# Patient Record
Sex: Male | Born: 1940 | Race: White | Hispanic: No | Marital: Married | Smoking: Former smoker
Health system: Southern US, Community
[De-identification: ages and names within clinical notes are randomized; demographics above are authoritative.]

## PROBLEM LIST (undated history)

## (undated) DIAGNOSIS — Z86010 Personal history of colonic polyps: Secondary | ICD-10-CM

## (undated) DIAGNOSIS — I2119 ST elevation (STEMI) myocardial infarction involving other coronary artery of inferior wall: Secondary | ICD-10-CM

## (undated) DIAGNOSIS — N486 Induration penis plastica: Secondary | ICD-10-CM

## (undated) DIAGNOSIS — F410 Panic disorder [episodic paroxysmal anxiety] without agoraphobia: Secondary | ICD-10-CM

## (undated) DIAGNOSIS — N411 Chronic prostatitis: Secondary | ICD-10-CM

## (undated) DIAGNOSIS — C801 Malignant (primary) neoplasm, unspecified: Secondary | ICD-10-CM

## (undated) DIAGNOSIS — H9190 Unspecified hearing loss, unspecified ear: Secondary | ICD-10-CM

## (undated) DIAGNOSIS — N189 Chronic kidney disease, unspecified: Secondary | ICD-10-CM

## (undated) DIAGNOSIS — E669 Obesity, unspecified: Secondary | ICD-10-CM

## (undated) DIAGNOSIS — B351 Tinea unguium: Secondary | ICD-10-CM

## (undated) DIAGNOSIS — E785 Hyperlipidemia, unspecified: Secondary | ICD-10-CM

## (undated) DIAGNOSIS — I10 Essential (primary) hypertension: Secondary | ICD-10-CM

## (undated) DIAGNOSIS — R011 Cardiac murmur, unspecified: Secondary | ICD-10-CM

## (undated) DIAGNOSIS — I4510 Unspecified right bundle-branch block: Secondary | ICD-10-CM

## (undated) DIAGNOSIS — H9319 Tinnitus, unspecified ear: Secondary | ICD-10-CM

## (undated) DIAGNOSIS — D649 Anemia, unspecified: Secondary | ICD-10-CM

## (undated) DIAGNOSIS — H269 Unspecified cataract: Secondary | ICD-10-CM

## (undated) DIAGNOSIS — M720 Palmar fascial fibromatosis [Dupuytren]: Secondary | ICD-10-CM

## (undated) DIAGNOSIS — E7800 Pure hypercholesterolemia, unspecified: Secondary | ICD-10-CM

## (undated) DIAGNOSIS — L570 Actinic keratosis: Secondary | ICD-10-CM

## (undated) DIAGNOSIS — D049 Carcinoma in situ of skin, unspecified: Secondary | ICD-10-CM

## (undated) DIAGNOSIS — G4730 Sleep apnea, unspecified: Secondary | ICD-10-CM

## (undated) DIAGNOSIS — I639 Cerebral infarction, unspecified: Secondary | ICD-10-CM

## (undated) DIAGNOSIS — Z9861 Coronary angioplasty status: Secondary | ICD-10-CM

## (undated) DIAGNOSIS — I2510 Atherosclerotic heart disease of native coronary artery without angina pectoris: Secondary | ICD-10-CM

## (undated) DIAGNOSIS — N179 Acute kidney failure, unspecified: Secondary | ICD-10-CM

## (undated) HISTORY — DX: Pure hypercholesterolemia, unspecified: E78.00

## (undated) HISTORY — DX: Panic disorder (episodic paroxysmal anxiety): F41.0

## (undated) HISTORY — PX: CATARACT EXTRACTION W/ INTRAOCULAR LENS  IMPLANT, BILATERAL: SHX1307

## (undated) HISTORY — DX: Cerebral infarction, unspecified: I63.9

## (undated) HISTORY — DX: Tinnitus, unspecified ear: H93.19

## (undated) HISTORY — DX: Tinea unguium: B35.1

## (undated) HISTORY — DX: Coronary angioplasty status: Z98.61

## (undated) HISTORY — DX: Obesity, unspecified: E66.9

## (undated) HISTORY — DX: Personal history of colonic polyps: Z86.010

## (undated) HISTORY — PX: ESOPHAGOGASTRODUODENOSCOPY: SHX1529

## (undated) HISTORY — DX: Unspecified right bundle-branch block: I45.10

## (undated) HISTORY — DX: Hyperlipidemia, unspecified: E78.5

## (undated) HISTORY — DX: ST elevation (STEMI) myocardial infarction involving other coronary artery of inferior wall: I21.19

## (undated) HISTORY — PX: CHOLECYSTECTOMY: SHX55

## (undated) HISTORY — DX: Induration penis plastica: N48.6

## (undated) HISTORY — DX: Essential (primary) hypertension: I10

## (undated) HISTORY — DX: Unspecified cataract: H26.9

## (undated) HISTORY — DX: Sleep apnea, unspecified: G47.30

## (undated) HISTORY — DX: Chronic prostatitis: N41.1

## (undated) HISTORY — DX: Carcinoma in situ of skin, unspecified: D04.9

## (undated) HISTORY — DX: Anemia, unspecified: D64.9

## (undated) HISTORY — DX: Cardiac murmur, unspecified: R01.1

## (undated) HISTORY — DX: Atherosclerotic heart disease of native coronary artery without angina pectoris: I25.10

## (undated) HISTORY — DX: Palmar fascial fibromatosis (dupuytren): M72.0

---
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
## 1999-04-29 ENCOUNTER — Other Ambulatory Visit: Admission: RE | Admit: 1999-04-29 | Discharge: 1999-04-29 | Payer: Self-pay | Admitting: *Deleted

## 2001-01-02 ENCOUNTER — Inpatient Hospital Stay (HOSPITAL_COMMUNITY): Admission: EM | Admit: 2001-01-02 | Discharge: 2001-01-11 | Payer: Self-pay | Admitting: Emergency Medicine

## 2001-01-02 ENCOUNTER — Encounter: Payer: Self-pay | Admitting: Emergency Medicine

## 2001-01-02 HISTORY — PX: CORONARY ANGIOPLASTY: SHX604

## 2004-03-09 ENCOUNTER — Ambulatory Visit (HOSPITAL_COMMUNITY): Admission: RE | Admit: 2004-03-09 | Discharge: 2004-03-09 | Payer: Self-pay | Admitting: *Deleted

## 2004-03-09 HISTORY — PX: COLONOSCOPY: SHX174

## 2004-11-05 ENCOUNTER — Encounter: Admission: RE | Admit: 2004-11-05 | Discharge: 2004-11-05 | Payer: Self-pay | Admitting: Family Medicine

## 2006-02-22 ENCOUNTER — Ambulatory Visit: Payer: Self-pay | Admitting: Family Medicine

## 2006-04-05 ENCOUNTER — Ambulatory Visit (HOSPITAL_BASED_OUTPATIENT_CLINIC_OR_DEPARTMENT_OTHER): Admission: RE | Admit: 2006-04-05 | Discharge: 2006-04-05 | Payer: Self-pay | Admitting: Orthopedic Surgery

## 2006-04-05 ENCOUNTER — Encounter (INDEPENDENT_AMBULATORY_CARE_PROVIDER_SITE_OTHER): Payer: Self-pay | Admitting: Specialist

## 2006-09-07 DIAGNOSIS — F529 Unspecified sexual dysfunction not due to a substance or known physiological condition: Secondary | ICD-10-CM | POA: Insufficient documentation

## 2006-09-07 DIAGNOSIS — H9319 Tinnitus, unspecified ear: Secondary | ICD-10-CM | POA: Insufficient documentation

## 2006-09-07 DIAGNOSIS — D049 Carcinoma in situ of skin, unspecified: Secondary | ICD-10-CM | POA: Insufficient documentation

## 2006-09-07 DIAGNOSIS — I10 Essential (primary) hypertension: Secondary | ICD-10-CM

## 2006-09-07 DIAGNOSIS — F410 Panic disorder [episodic paroxysmal anxiety] without agoraphobia: Secondary | ICD-10-CM | POA: Insufficient documentation

## 2006-09-07 DIAGNOSIS — E785 Hyperlipidemia, unspecified: Secondary | ICD-10-CM

## 2006-09-07 DIAGNOSIS — E119 Type 2 diabetes mellitus without complications: Secondary | ICD-10-CM

## 2006-09-07 DIAGNOSIS — B351 Tinea unguium: Secondary | ICD-10-CM

## 2006-09-07 DIAGNOSIS — N486 Induration penis plastica: Secondary | ICD-10-CM | POA: Insufficient documentation

## 2006-09-07 DIAGNOSIS — R42 Dizziness and giddiness: Secondary | ICD-10-CM

## 2006-09-07 HISTORY — DX: Tinea unguium: B35.1

## 2006-11-29 ENCOUNTER — Encounter (INDEPENDENT_AMBULATORY_CARE_PROVIDER_SITE_OTHER): Payer: Self-pay | Admitting: Family Medicine

## 2007-03-20 ENCOUNTER — Encounter (INDEPENDENT_AMBULATORY_CARE_PROVIDER_SITE_OTHER): Payer: Self-pay | Admitting: Family Medicine

## 2007-04-20 ENCOUNTER — Ambulatory Visit: Payer: Self-pay | Admitting: Family Medicine

## 2007-04-20 LAB — CONVERTED CEMR LAB: PSA: 0.47 ng/mL (ref 0.10–4.00)

## 2007-04-23 ENCOUNTER — Telehealth (INDEPENDENT_AMBULATORY_CARE_PROVIDER_SITE_OTHER): Payer: Self-pay | Admitting: *Deleted

## 2007-05-22 ENCOUNTER — Ambulatory Visit: Payer: Self-pay | Admitting: Family Medicine

## 2007-06-26 ENCOUNTER — Encounter (INDEPENDENT_AMBULATORY_CARE_PROVIDER_SITE_OTHER): Payer: Self-pay | Admitting: Family Medicine

## 2007-08-10 ENCOUNTER — Encounter: Payer: Self-pay | Admitting: Internal Medicine

## 2010-02-13 HISTORY — PX: OTHER SURGICAL HISTORY: SHX169

## 2010-02-18 ENCOUNTER — Ambulatory Visit: Payer: Self-pay | Admitting: Cardiology

## 2010-02-22 ENCOUNTER — Telehealth (INDEPENDENT_AMBULATORY_CARE_PROVIDER_SITE_OTHER): Payer: Self-pay | Admitting: *Deleted

## 2010-02-23 ENCOUNTER — Encounter (HOSPITAL_COMMUNITY)
Admission: RE | Admit: 2010-02-23 | Discharge: 2010-03-12 | Payer: Self-pay | Source: Home / Self Care | Admitting: Cardiology

## 2010-02-23 ENCOUNTER — Ambulatory Visit: Payer: Self-pay

## 2010-02-23 ENCOUNTER — Ambulatory Visit: Payer: Self-pay | Admitting: Internal Medicine

## 2010-02-23 ENCOUNTER — Encounter: Payer: Self-pay | Admitting: Internal Medicine

## 2010-06-15 NOTE — Assessment & Plan Note (Signed)
Summary: Cardiology Nuclear Testing  Nuclear Med Background Indications for Stress Test: Evaluation for Ischemia   History: Angioplasty, Myocardial Infarction, Myocardial Perfusion Study, Stents  History Comments: 02 MI with PTCA of circ and stent of RCA 09 MPS with infer scar and no sign isch     Nuclear Pre-Procedure Cardiac Risk Factors: Hypertension, IDDM Type 2, Lipids, Obesity, RBBB Caffeine/Decaff Intake: none NPO After: 6:30 PM Lungs: clear IV 0.9% NS with Angio Cath: 20g     IV Site: R Hand IV Started by: Cathlyn Parsons, RN Chest Size (in) 42     Height (in): 70 Weight (lb): 265 BMI: 38.16 Tech Comments: Held Diabetes meds.  Nuclear Med Study 1 or 2 day study:  1 day     Stress Test Type:  Eugenie Birks Reading MD:  Arvilla Meres, MD     Referring MD:  Fermin Schwab Resting Radionuclide:  Technetium 46m Tetrofosmin     Resting Radionuclide Dose:  11.0 mCi  Stress Radionuclide:  Technetium 40m Tetrofosmin     Stress Radionuclide Dose:  33.0 mCi   Stress Protocol Exercise Time (min):  2:00 min     Max HR:  136 bpm     Predicted Max HR:  152 bpm  Max Systolic BP: 172 mm Hg     Percent Max HR:  89.47 %     METS: 1.7 Rate Pressure Product:  16109  Lexiscan: 0.4 mg   Stress Test Technologist:  Cathlyn Parsons, RN     Nuclear Technologist:  Doyne Keel, CNMT  Rest Procedure  Myocardial perfusion imaging was performed at rest 45 minutes following the intravenous administration of Technetium 20m Tetrofosmin.  Stress Procedure  The patient received IV Lexiscan 0.4 mg over 15-seconds with concurrent low level exercise and then Technetium 40m Tetrofosmin was injected at 30-seconds while the patient continued walking one more minute.  There were nonspecific STchanges with Lexiscan. Patient had frequent PVC's. Patient had some AV block prior to test and post stress test.  Patient had no chest pain. Quantitative spect images were obtained after a 45 minute  delay.  QPS Raw Data Images:  Normal; no motion artifact; normal heart/lung ratio. Stress Images:  Decreased uptake in the inferior and lateral walls Rest Images:  Decreased uptake in the inferior wall Subtraction (SDS):  Dense inferior infarct with mild ischemia in the lateral wall Transient Ischemic Dilatation:  1.08  (Normal <1.22)  Lung/Heart Ratio:  .37  (Normal <0.45)  Quantitative Gated Spect Images QGS EDV:  161 ml QGS ESV:  99 ml QGS EF:  39 % QGS cine images:  Inferolateral hypokinesis  Findings Abormal nuclear study Evidence for lateral ischemia  Evidence for inferior infarct  Evidence for LV Dysfunction LV Dysfunction    Overall Impression  Exercise Capacity: Lexiscan with no exercise. ECG Impression: No significant ST segment change suggestive of ischemia. Overall Impression: Abnormal stress nuclear study. Overall Impression Comments: Dense inferior infarct with mild ischemia in the lateral wall, dilated LV with EF 39%.

## 2010-06-15 NOTE — Progress Notes (Signed)
Summary: nuc pre procedure  Phone Note Outgoing Call Call back at Home Phone [phone]   Call placed by: Cathlyn Parsons RN,  February 22, 2010 4:50 PM Call placed to: Patient Reason for Call: Confirm/change Appt Summary of Call: Left message with information on Myoview Information Sheet (see scanned document for details).      Nuclear Med Background Indications for Stress Test: Evaluation for Ischemia   History: Angioplasty, Myocardial Infarction, Myocardial Perfusion Study, Stents  History Comments: 02 MI with PTCA of circ and stent of RCA 09 MPS with infer scar and no sign isch     Nuclear Pre-Procedure Cardiac Risk Factors: Hypertension, IDDM Type 2, Lipids, Obesity, RBBB Height (in): 70

## 2010-08-10 DIAGNOSIS — D539 Nutritional anemia, unspecified: Secondary | ICD-10-CM | POA: Insufficient documentation

## 2010-09-16 ENCOUNTER — Other Ambulatory Visit: Payer: Self-pay | Admitting: *Deleted

## 2010-09-16 DIAGNOSIS — I2510 Atherosclerotic heart disease of native coronary artery without angina pectoris: Secondary | ICD-10-CM

## 2010-09-16 MED ORDER — ISOSORBIDE MONONITRATE ER 30 MG PO TB24: ORAL_TABLET | ORAL | Status: DC

## 2010-10-01 NOTE — Op Note (Signed)
NAME:  Jesse Wheeler, ADACHI                ACCOUNT NO.:  [id number]   MEDICAL RECORD NO.:  [id number]          PATIENT TYPE:  AMB   LOCATION:  DSC                          FACILITY:  [facility]   PHYSICIAN:  Cindee Salt, M.D.       DATE OF BIRTH:  1940/10/27   DATE OF PROCEDURE:  04/05/2006  DATE OF DISCHARGE:                                 OPERATIVE REPORT   PREOPERATIVE DIAGNOSIS:  Dupuytren's contracture left middle, left ring  finger.   POSTOPERATIVE DIAGNOSIS:  Dupuytren's contracture left middle, left ring  finger.   OPERATION:  Excision palmar fascia, left middle, left ring finger with VY  advancement.   SURGEON:  Cindee Salt, M.D.   ASSISTANT:  Carolyne Fiscal R.N.   ANESTHESIA:  Nerve block.   HISTORY:  The patient is a 70 year old male with a history of palmar  fasciitis, Dupuytren's contracture with contracture of the ring finger, to a  lesser extent middle finger left hand.  He is desirous of removal.  Pre,  peri and postoperative course been thoroughly discussed with the patient.  He is aware there is no guarantee with the surgery, probability of  recurrence, possibility of extension, possibility of infection, injury to  arteries, nerves, tendons, incomplete relief of symptoms, stiffness,  dystrophy, the possibility of open palm technique.  In the preoperative area  questions were encouraged and answered.  He has elected to proceed to have  this done.  The area of the incisions marked by both the patient and  surgeon.   PROCEDURE:  The patient is brought to the operating room where an axillary  block was carried out without difficulty.  He was prepped using DuraPrep,  supine position, left arm free.  The limb was exsanguinated from the wrist  proximally.  A tourniquet placed high on the arm was inflated to 250 mmHg.  A zigzag incision volar Brunner type incision was made based primarily on  the ring finger, carried down through subcutaneous tissue.  Bleeders were  electrocauterized.   The cord was identified proximally over the carpal  retinaculum.  This was elevated.  The neurovascular structures were  identified and traced distally.  Lifting the palmar fascia, the attachments  to the A1 and A2 pulleys were excised with protection to the neurovascular  bundles.  This allowed mobilization of the cord distally and a large  contribution to the middle finger was noted through the natatory ligament.  The cord was followed as a lateral digital sheath out to the middle phalanx.  This was excised in toto.  A separate incision was then made over the ring  finger proximal phalanx.  The contribution to the middle finger was released  proximally and then the natatory ligament cord was traced distally and  removed in toto, protecting neurovascular bundles.  This allowed complete  extension of both middle and ring fingers.  The wounds were irrigated.  The  V's were converted to Y's.  A double vessel loop drain was then placed to  the depths of the wound and the skin was then closed loosely with  interrupted 5-0  nylon sutures.  A sterile compressive dressing was applied.  Tourniquet deflated.  All fingers immediately pinked.  A splint was applied.  The patient was taken to recovery room for observation in satisfactory  condition.  He will be discharged home to return to [facility] in one week on Vicodin.           ______________________________  Cindee Salt, M.D.     GK/MEDQ  D:  04/05/2006  T:  04/05/2006  Job:  [id number]   cc:   Leanne Chang, M.D.

## 2010-10-01 NOTE — Discharge Summary (Signed)
[place]. [facility]  Patient:    Jesse Wheeler, Jesse Wheeler Visit Number: [id number] MRN: [id number]          Service Type: MED Location: [phone] Attending Physician:  Jesse Wheeler Dictated by:   Jesse Wheeler Jesse Wheeler, R.N., A.N.P. Adm. Date:  01/02/2001 Disc. Date: 01/11/2001   CC:         Jesse Wheeler, M.D.  Jesse Wheeler, M.D.   Discharge Summary  DISCHARGE DIAGNOSES: 1. Acute inferior myocardial infarction with initial stent deployment x 3    to the right coronary artery with subsequent occlusion and recurrent    myocardial infarction with repeat coronary angiography and stent placement    to the left circumflex. 2. Hypertensive heart disease. 3. Non-insulin-dependent diabetes mellitus. 4. Hypercholesterolemia.  HISTORY OF PRESENT ILLNESS:  [name] is a very pleasant 70 year old white male who has known hypertensive heart disease, ongoing diabetes as well as hypercholesterolemia.  He presented to the emergency department on January 02, 2001, after having multiple episodes of chest pressure which had basically been occurring off and on over the previous weekend.  On the day of admission, it became more constant in nature.  There was some radiation into both arms and was associated with diaphoresis.  It was described as an indigestion, pressure-like discomfort.  In the emergency room, his EKG demonstrated acute myocardial infarction and he was subsequently admitted.  Please see the dictation history and physical for further patient presentation and profile.  LABORATORY DATA:  EKG is as described above.  Chemistries were satisfactory. BUN was 24, creatinine 1.1, hemoglobin 17, hematocrit 50.  Chest x-ray showed no active disease.  HOSPITAL COURSE:  The patient was admitted from the emergency department.  He was taken emergently to the cardiac catheterization lab.  It was noted that the right coronary artery was 100% occluded  proximally.  The left main was normal.  The LAD has irregularities with collaterals from the LAD to the posterior descending.  The left circumflex had a 95% posterolateral narrowing and it is approximately 2.0 to 2.5 mm vessel.  Ejection fraction was 50% with inferior hypokinesis.  IV heparin as well as IV Integrilin were administered and subsequently three stents were placed to the right coronary artery. Initial results were quite satisfactory.  He was subsequently transferred to the coronary care unit for further monitoring and evaluation.  His peak cardiac enzymes were 177.  By January 05, 2001, he was doing well without complaints.  He was demonstrating some low blood pressures.  His Prinivil dosage was decreased accordingly with subsequent resolution.  His IV medications were weaned off.  He was placed on Nitrol paste and by January 06, 2001, he continued to do well.  His telemetry monitoring showed some bradycardia with some nodal rhythms as well as missed beats, but was felt to be due to his infarction.  Our plans at that time were for the patient to be discharged on January 07, 2001.  However, on the morning of January 06, 2001, shortly after seen in morning rounds, he began having a pressure-like discomfort in his chest that was rated a 2 on a scale of 0 to 10.  Repeat cardiac enzymes confirmed recurrent myocardial damage with a peak MV of 118. He was placed back on IV nitroglycerin and subsequently IV heparin.  We proceeded on with repeat coronary angiography on Monday, August 26.  At that time there was noted to be considerable thrombus in the stents with very slow  flow.  There were collaterals that have already developed to the distal right coronary from the left system.  The left circumflex had a 99% narrowing and angioplasty was performed with a 2.0 x 20 mm Maverick balloon with an overall satisfactory result obtained.  Initially it was planned for angioplasty to be performed of  the circumflex in a four to six-week time.  In light of the complex revascularization of this long segment in the stented right coronary artery.  After angioplasty of the circumflex was performed, there was an increase in collateral flow to the distal right coronary which resulted in decreased antegrade flow to the distal right coronary.  At that time, it was Dr. Angelina Pih judgment that revascularization of the right coronary artery stents would not be successful on a longterm or shortterm basis and it was elected to forego repeat angioplasty or angiojet of the right coronary. Following that procedure, he was transferred to 6500.  Cardiac enzymes were down to a total of 434 with MB of 33.  His chemistries and CBCs remained stable.  He was weaned off IV nitroglycerin and he has progressed quite well throughout the remainder of his hospitalization.  Today on January 11, 2001, he is doing well with no complaints.  He continues to have a few missed beats on telemetry, but he remains asymptomatic.  Chemistries are satisfactory and he is felt to be a stable candidate for discharge today.  CONDITION ON DISCHARGE:  Improved.  DISCHARGE MEDICATIONS: 1. Imdur 60 mg a day. 2. Amaryl 4 mg a day. 3. Actos 45 mg a day. 4. Lipitor 20 mg a day. 5. Aspirin daily. 6. Protonix 40 mg a day. 7. Plavix 75 mg daily for the next 21 days. 8. Prinivil 20 mg a day. 9. We will be stopping Maxzide as well as Ditropan and he has a prescription    for nitroglycerin to be used on a p.r.n. basis.  We have also given him the okay to resume his Glucophage per his home routine.  ACTIVITY:  No driving, no sexual intercourse.  He may walk 5 to 10 minutes two times a day.  DIET:  Low fat, diabetic.  He is to call our office with any problems.  Otherwise will ask to see him in approximately 10 days and he is asked to call the office to schedule that appointment. Dictated by:   Jesse Wheeler Jesse Wheeler, R.N.,  A.N.P.  Attending Physician:  Jesse Wheeler DD:  01/11/01 TD:  01/11/01 Job: [id number]

## 2010-10-01 NOTE — Cardiovascular Report (Signed)
[place]. [facility]  Patient:    Jesse Wheeler, Jesse Wheeler Visit Number: [id number] MRN: [id number]          Service Type: MED Location: MICU 2116 [id number] Attending Physician:  Eleanora Neighbor Proc. Date: 01/02/01 Adm. Date:  01/02/2001   CC:         Lilyan Punt. Sydnee Levans, M.D., [facility]   Cardiac Catheterization  HISTORY:  The patient presents with acute inferior myocardial infarction and is referred for acute catheterization and angioplasty.  PROCEDURE:  Left heart catheterization with selective coronary angiography, left ventricular angiography, left ventricular angiography, and stent placement into the right coronary artery.  TYPE AND SITE OF ENTRY:  Percutaneous right femoral artery.  CATHETERS:  A 6 French 4 curved Judkins right and left coronary catheters, 6 French pigtail ventriculographic catheter, 7 [place] guide with side holes, Hi-Torque floppy guide wire, BMW guide wire, Cross-It 200 wire, a 3.0 x 20 mm Maverick, subsequent return with two different 3.0 x 23 mm Penta stents and a third 3.0 x 13 mm Penta stent, all placed in the right coronary artery.  MEDICATIONS GIVEN DURING THE PROCEDURE:  Heparin, Versed, fentanyl, and Integrilin double bolus.  COMMENTS:  The patient in general, tolerated the procedure well.  It was a long procedure.  HEMODYNAMIC DATA:  The aortic pressure was 107/75, LV was 121/10-19.  There was no aortic valve gradient noted on pullback.  ANGIOGRAPHIC DATA: 1. Left main coronary artery:  Normal. 2. Left anterior descending:  The left anterior descending is a moderately    long vessel that ends at the apex but does give collaterals to the    distal right coronary artery.  There is a 50% narrowing in a second    diagonal vessel.  There are irregularities in the left anterior descending    but no significant focal disease. 3. Left circumflex:  The left circumflex continues primarily as a large    obtuse marginal.  There  is a secondary branch of this marginal and there is    95% focal stenosis.  It is a 2 to 2.5 mm vessel and would be suitable for    angioplasty because this is a significant focal lesion.  The left    circumflex does give scanty collaterals to the distal right coronary    artery. 4. Right coronary artery:  The right coronary artery is totally occluded    proximally.  LEFT VENTRICULAR ANGIOGRAM:  Left ventricular angiogram was performed in the RAO position.  Overall, cardiac size and silhouette were normal.  The apex, including the inferior apex, pulled in reasonably well, and there was hypokinesis of the inferior base.  There was no mitral regurgitation, intracardiac calcification or intracavitary filling defect.  ANGIOPLASTY PROCEDURE:  We used a 7 [place] guide with side holes throughout the procedure and this provided satisfactory backup.  We used several guide wires to try to cross the lesion.  We were able to cross the lesion with a Hi-Torque floppy guide wire, but really it did not float freely into the distal vessel.  It took considerable time before we really felt comfortable that we actually were in the distal vessel (and in fact probably were not).  In any case, at the point we thought we had reestablished flow and were able to inflate the balloon we passed a 3.0 x 20 mm Express balloon and inflated to a maximum of 16 atmospheres.  This resulted in a spiraling dissection of the right coronary  artery that began in a retrograde fashion to the ostium and extended towards the crux.  There remained flow and the patient really did not get overly sick at that point in time, but it was felt that we were going to compromise flow entirely.  Because of the retrograde dissection toward the ostium, I elected to place a 3.0 x 23 mm stent really in hopes of ending up with an occluded vessel similar to what we had and to prevent dissection into the aorta.  After the placement of the 23 x  3.0 mm Penta stent, we had a tacking up of the proximal portion of the dissection plane and the false lumen became much less.  In the midportion of the vessel, there still was the hazy clot latent stenosis.  At that point in time, we tried to put another 23 x 3 mm Penta stent, but were unable to pass that through the first Penta stent.  We tried to dilate with the original 3.0 Maverick balloon but were unsuccessful.  We then returned with a 3.0 x 13 mm Penta stent and placed that distally over the terminal portion of the dissection.  We were left with the midportion of the dissection and once again tried the 23 mm Penta stent and were able to successfully place that in the midportion of the vessel.  This resulted in approximately 55 mm of stented right coronary artery.  However, the stented section of the proximal right coronary artery was excellent and an acute marginal vessel was retained, as well as excellent flow to the posterolateral branch.  However, the posterior descending remained occluded.  We passed a guide wire across this vessel but were unable to open the vessel.  It did have excellent collateral flow from the distal left anterior descending and we elected not to proceed on with further interventions.  We did try to give verapamil intracoronary to reestablish flow and this was unsuccessful.  However, we did have excellent angiographic result, although it was a long-stented segment.  OVERALL IMPRESSION: 1. Acute inferior myocardial infarction. 2. Stent placement in the proximal right coronary artery (three separate    stents totalling approximately 55 mm of stent with residual thrombus    in the posterior descending artery). 3. Relatively mild inferior basilar hypokinesia. 4. Severe stenosis in a secondary marginal vessel of the left circumflex. 5. Minimal coronary atherosclerosis otherwise of the left coronary system. Attending Physician:  Eleanora Neighbor DD:   01/02/01 TD:  01/03/01 Job: [id number]

## 2010-10-01 NOTE — H&P (Signed)
[place]. [facility]  Patient:    Jesse Wheeler, Jesse Wheeler Visit Number: [id number] MRN: [id number]          Service Type: MED Location: [id number] Attending Physician:  Osvaldo Human Dictated by:   Jennet Maduro Earl Gala, R.N., A.N.P. Adm. Date:  01/02/2001   CC:         Veverly Fells. [place], M.D.  Lilyan Punt Sydnee Levans, M.D. at [facility]   History and Physical  CHIEF COMPLAINT:  Chest discomfort.  HISTORY OF PRESENT ILLNESS:  Jesse Wheeler is a 70 year old white male who has known hypertensive heart disease, ongoing diabetes, as well as hypercholesterolemia, who presents to the emergency room department today after episodes of chest pressure which had basically been off and on over the past weekend up until this morning, when it became constant in nature; it occurred while he was working at his job.  He has had some radiation into both arms and has been associated with diaphoresis as well.  It is described as an indigestion pressure-like sensation.  His 12-lead electrocardiogram in the emergency room confirms acute inferior myocardial infarction.  He is subsequently admitted.  PAST MEDICAL HISTORY:  Significant for hypertension, diabetes, hypercholesterolemia.  He denies any history of stroke, previous heart attack, or cancer.  ALLERGIES:  None.  CURRENT MEDICINES: 1. Maxzide. 2. Actos 45 mg a day. 3. Ditropan XL 10 mg a day. 4. Glucophage 850 mg t.i.d. 5. Aspirin daily. 6. Amaryl 4 mg a day. 7. Prinivil 40 mg a day. 8. Lipitor 20 mg a day.  FAMILY HISTORY:  His father died at 30.  He had a history of heart disease and died from heart attack.  Mother is alive at the age of 60.  SOCIAL HISTORY:  He is married.  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  He has had no smoking for the past six to eight years.  There is no alcohol use.  REVIEW OF SYSTEMS:  Otherwise negative.  He has had no recent fever, flu,  or cough.  He has never had chest discomfort like this before.  He has had no complaints of palpitations.  PHYSICAL EXAMINATION:  GENERAL:  He is currently in no acute distress.  He continued to complain of a mild chest discomfort.  VITAL SIGNS:  Blood pressure 107/80, heart rate in the 90s, respirations 18.  SKIN:  Warm and dry.  He is slightly diaphoretic.  Color is within normal limits.  LUNGS:  Clear.  HEART:  Shows a regular rhythm.  ABDOMEN:  Soft, obese, with positive bowel sounds.  EXTREMITIES:  Without edema.  NEUROLOGIC:  Intact.  There are no gross focal deficits.  LABORATORY DATA:  Chemistries are satisfactory.  BUN 24, creatinine 1.1. Hemoglobin 17, hematocrit 50.  EKG shows acute inferior myocardial infarction with right bundle-branch block.  Chest x-ray results are pending.  IMPRESSION:  Acute inferior wall myocardial infarction.  PLAN:  Will proceed on with IV nitroglycerin, IV heparin, as well as acute cardiac catheterization.  The procedure has been discussed in full detail with both he and his wife and they are willing to proceed. Dictated by:   Jennet Maduro Earl Gala, R.N., A.N.P. Attending Physician:  Osvaldo Human DD:  01/02/01 TD:  01/02/01 Job: [id number]

## 2010-12-24 ENCOUNTER — Encounter: Payer: Self-pay | Admitting: *Deleted

## 2010-12-27 ENCOUNTER — Encounter: Payer: Self-pay | Admitting: Nurse Practitioner

## 2010-12-27 ENCOUNTER — Ambulatory Visit (INDEPENDENT_AMBULATORY_CARE_PROVIDER_SITE_OTHER): Payer: Medicare Other | Admitting: Nurse Practitioner

## 2010-12-27 VITALS — BP 126/80 | HR 84 | Ht 69.0 in | Wt 268.4 lb

## 2010-12-27 DIAGNOSIS — I2510 Atherosclerotic heart disease of native coronary artery without angina pectoris: Secondary | ICD-10-CM | POA: Insufficient documentation

## 2010-12-27 DIAGNOSIS — I10 Essential (primary) hypertension: Secondary | ICD-10-CM

## 2010-12-27 DIAGNOSIS — E785 Hyperlipidemia, unspecified: Secondary | ICD-10-CM

## 2010-12-27 DIAGNOSIS — E669 Obesity, unspecified: Secondary | ICD-10-CM

## 2010-12-27 MED ORDER — ISOSORBIDE MONONITRATE ER 30 MG PO TB24: ORAL_TABLET | ORAL | Status: DC

## 2010-12-27 NOTE — Assessment & Plan Note (Signed)
Blood pressure looks ok. No change in medicines.

## 2010-12-27 NOTE — Assessment & Plan Note (Signed)
Lipids were ok with his current regimen. He is tolerating his medicines. Will recheck in 6 months.

## 2010-12-27 NOTE — Assessment & Plan Note (Signed)
Clinically he is doing well. He is up to date on his stress testing. He remains asymptomatic. We will see him back in 6 months. May need to consider echo on return to follow up on EF. Patient is agreeable to this plan and will call if any problems develop in the interim.

## 2010-12-27 NOTE — Progress Notes (Signed)
Jesse Wheeler Date of Birth: 03/27/1941   History of Present Illness: Jesse Wheeler is seen today for his 10 month visit. He is seen for Dr. Sanjuana Kava. He is a former patient of Dr. Ronnald Nian. He wanted to see me prior to seeing Dr. Sanjuana Kava. He is doing well clinically. He has not lost weight. No chest pain or shortness of breath. No swelling. He is not dizzy. He is tolerating his medicines. Blood sugars have been ok. A1C is 7.2. Blood pressure has been good. He does not really exercise but tries to stay active restoring old cars.   Current Outpatient Prescriptions on File Prior to Visit  Medication Sig Dispense Refill  . aspirin 81 MG tablet Take 81 mg by mouth daily.        . Blood Glucose Monitoring Suppl (ONE TOUCH ULTRA SYSTEM KIT) W/DEVICE KIT 1 kit by Does not apply route once. Take as Directed       . Cholecalciferol (VITAMIN D-3) 5000 UNITS TABS Take 1 tablet by mouth once a week.        . Cyanocobalamin (VITAMIN B-12 PO) Take 1 tablet by mouth daily.        Marland Kitchen doxazosin (CARDURA) 4 MG tablet Take 4 mg by mouth at bedtime.       Marland Kitchen ezetimibe (ZETIA) 10 MG tablet Take 10 mg by mouth daily.        . insulin glargine (LANTUS) 100 UNIT/ML injection Inject 50 Units into the skin daily.        . IRON PO Take 325 mg by mouth daily.        . Liraglutide 18 MG/3ML SOLN Inject into the skin daily.        Marland Kitchen lisinopril (PRINIVIL,ZESTRIL) 20 MG tablet Take 20 mg by mouth daily.        . metFORMIN (GLUCOPHAGE) 850 MG tablet Take 850 mg by mouth 3 (three) times daily.        . nitroGLYCERIN (NITROSTAT) 0.4 MG SL tablet Place 0.4 mg under the tongue every 5 (five) minutes as needed.        . simvastatin (ZOCOR) 80 MG tablet Take 80 mg by mouth at bedtime.        Marland Kitchen DISCONTD: isosorbide mononitrate (IMDUR) 30 MG 24 hr tablet TAKE 2 TABLETS PO DAILY  60 tablet  3  . pioglitazone (ACTOS) 45 MG tablet Take 45 mg by mouth daily.          No Known Allergies  Past Medical History  Diagnosis Date  .  Coronary artery disease   . Acute inferior myocardial infarction     stenting in 2002 to the RCA  . S/P coronary angioplasty   . Hypertension     LCX  . Hyperlipidemia   . Diabetes mellitus   . Hypercholesterolemia   . Right bundle branch block   . Obesity     Past Surgical History  Procedure Date  . Coronary angioplasty 01/02/01    Acute inferior MI -- Stent placement in the proximal RCA (three separate stents totalling approximately 55 mm of stent with residual thrombus in the posterior descending artery) --  Relatively mild inferior basilar hypokinesia --Severe stenosis in a secondary marginal vessel of the left circumflex -- Minimal coronary atherosclerosis otherwise of the coronary system   . Nuclear stress test Oct 2011     Dense inferior infarct; mild ischemia with dilated LV. EF was 39%. Study was felt to be unchanged and he has been  managed medically    History  Smoking status  . Former Smoker  . Types: Cigarettes  . Quit date: 05/16/1993  Smokeless tobacco  . Not on file    History  Alcohol Use No    Family History  Problem Relation Age of Onset  . Heart attack Father   . Heart disease Father     Review of Systems: The review of systems is positive for mild neuropathy.  All other systems were reviewed and are negative.  Physical Exam: BP 126/80  Pulse 84  Ht 5\' 9"  (1.753 m)  Wt 268 lb 6.4 oz (121.745 kg)  BMI 39.64 kg/m2 Patient is very pleasant and in no acute distress. He is obese. Skin is warm and dry. Color is normal.  HEENT is unremarkable. Normocephalic/atraumatic. PERRL. Sclera are nonicteric. Neck is supple. No masses. No JVD. Lungs are clear. Cardiac exam shows a regular rate and rhythm. Abdomen is obese and soft. Extremities are without edema. Gait and ROM are intact. No gross neurologic deficits noted.  LABORATORY DATA: Labs reviewed from his PCP are reviewed. Total cholesterol is 110, trig 91, LDL is 64, and HDL is 35. HGB was 11.3. LFT's are  ok.    Assessment / Plan:

## 2010-12-27 NOTE — Patient Instructions (Signed)
Stay on your current medicines We will see you in 6 months You will see Dr. Doylene Bode at that time I encourage you to exercise with a goal of 45 minutes each day.

## 2010-12-27 NOTE — Assessment & Plan Note (Signed)
Once again, he is encouraged to work on his weight. Regular exercise is advised.

## 2011-03-31 ENCOUNTER — Other Ambulatory Visit: Payer: Self-pay | Admitting: *Deleted

## 2011-03-31 MED ORDER — LISINOPRIL 20 MG PO TABS: 20.0000 mg | ORAL_TABLET | Freq: Every day | ORAL | Status: DC

## 2011-10-25 ENCOUNTER — Other Ambulatory Visit: Payer: Self-pay | Admitting: Nurse Practitioner

## 2011-10-25 MED ORDER — LISINOPRIL 20 MG PO TABS: 20.0000 mg | ORAL_TABLET | Freq: Every day | ORAL | Status: DC

## 2011-11-07 ENCOUNTER — Encounter: Payer: Self-pay | Admitting: Internal Medicine

## 2011-11-10 ENCOUNTER — Ambulatory Visit (INDEPENDENT_AMBULATORY_CARE_PROVIDER_SITE_OTHER): Payer: Medicare Other | Admitting: Internal Medicine

## 2011-11-10 ENCOUNTER — Other Ambulatory Visit (INDEPENDENT_AMBULATORY_CARE_PROVIDER_SITE_OTHER): Payer: Medicare Other

## 2011-11-10 ENCOUNTER — Ambulatory Visit (INDEPENDENT_AMBULATORY_CARE_PROVIDER_SITE_OTHER)
Admission: RE | Admit: 2011-11-10 | Discharge: 2011-11-10 | Disposition: A | Discharge status: Home / Self Care | Payer: Medicare Other | Source: Ambulatory Visit | Attending: Internal Medicine | Admitting: Internal Medicine

## 2011-11-10 ENCOUNTER — Encounter: Payer: Self-pay | Admitting: Internal Medicine

## 2011-11-10 VITALS — BP 110/60 | HR 70 | Ht 70.0 in | Wt 268.0 lb

## 2011-11-10 DIAGNOSIS — R1032 Left lower quadrant pain: Secondary | ICD-10-CM

## 2011-11-10 DIAGNOSIS — N289 Disorder of kidney and ureter, unspecified: Secondary | ICD-10-CM

## 2011-11-10 LAB — BASIC METABOLIC PANEL
BUN: 16 mg/dL (ref 6–23)
Chloride: 105 mEq/L (ref 96–112)
GFR: 48.81 mL/min — ABNORMAL LOW (ref >60.00)
Potassium: 4.5 mEq/L (ref 3.5–5.1)
Sodium: 137 mEq/L (ref 135–145)

## 2011-11-10 NOTE — Patient Instructions (Addendum)
Your physician has requested that you go to the basement for the following lab work before leaving today: BMET  You have been scheduled for a CT scan of the abdomen and pelvis at [facility] ([address]---this is in the same building as [facility]).   You are scheduled today at 11:30am. You should arrive 15 minutes prior to your appointment time for registration. Please follow the written instructions below on the day of your exam:  1) Do not eat or drink anything after now.  If you have any questions regarding your exam or if you need to reschedule, you may call the CT department at [phone] between the hours of 8:00 am and 5:00 pm, Monday-Friday.  ________________________________________________________________________

## 2011-11-10 NOTE — Progress Notes (Signed)
Quick Note:  I called results and explained this and BMET. Also spoke with Elpidio Anis PA-C at PCP office. Dr. Everlene Other is on vacation Plan is for patient to call to be seen next week at PCP office to determine next step in work-up and evaluation of problems - seems like GU problems are cause and not colon I will be on standby ______

## 2011-11-10 NOTE — Progress Notes (Signed)
Subjective:  Referred by: Aura Dials, MD   Patient ID: Jesse Wheeler, male    DOB: June 21, 1940, 71 y.o.   MRN: [id number]  HPI The patient is a very pleasant 71 year old white man who has been having some left lower quadrant pain for weeks to months. His deep in the groin. It is sometimes burning sometimes sharp in to radiate around to the flank though not much. When he saw Dr. Everlene Other I was noted that he had a history of chronic prostatitis. He did not claim urinary symptoms at that time. A question of diverticulitis was raised. He was started on Cipro and metronidazole he is about to finish those and he said that now he is noting that when he empties his bladder he feels better. He has not noted any hematuria or burning pain or dysuria in that regard but he had the urinates 5 times overnight, and every time the left lower quadrant and groin discomfort improved. He wonders if his testicles and a little swollen. He has not had fever. He has no bowel habit changes or rectal bleeding. He had a screening colonoscopy in 2005 that was normal. October of that year. He denies any chronic back pain, there is no leg pain or weakness or numbness or tingling or radicular symptoms.  GI review of systems is otherwise negative.  No Known Allergies Outpatient Prescriptions Prior to Visit  Medication Sig Dispense Refill  . aspirin 81 MG tablet Take 81 mg by mouth daily.        . Blood Glucose Monitoring Suppl (ONE TOUCH ULTRA SYSTEM KIT) W/DEVICE KIT 1 kit by Does not apply route once. Take as Directed       . ciprofloxacin (CIPRO) 500 MG tablet Take 500 mg by mouth 2 (two) times daily.      . clotrimazole-betamethasone (LOTRISONE) cream Apply topically daily.      . Cyanocobalamin (VITAMIN B-12 PO) Take 1 tablet by mouth daily.        Marland Kitchen doxazosin (CARDURA) 8 MG tablet Take 8 mg by mouth daily.      . ergocalciferol (VITAMIN D2) 50000 UNITS capsule Take 50,000 Units by mouth once a week.      . ezetimibe  (ZETIA) 10 MG tablet Take 10 mg by mouth daily.        . insulin glargine (LANTUS) 100 UNIT/ML injection Inject 50 Units into the skin daily.        . IRON PO Take 325 mg by mouth daily.        . isosorbide mononitrate (IMDUR) 30 MG 24 hr tablet TAKE 2 TABLETS PO DAILY  60 tablet  11  . Liraglutide 18 MG/3ML SOLN Inject into the skin daily.        Marland Kitchen lisinopril (PRINIVIL,ZESTRIL) 20 MG tablet Take 1 tablet (20 mg total) by mouth daily.  30 tablet  2  . meloxicam (MOBIC) 15 MG tablet Take 15 mg by mouth daily.      . metFORMIN (GLUCOPHAGE) 850 MG tablet Take 850 mg by mouth 3 (three) times daily.        . metroNIDAZOLE (FLAGYL) 500 MG tablet Take 500 mg by mouth 2 (two) times daily.      . nitroGLYCERIN (NITROSTAT) 0.4 MG SL tablet Place 0.4 mg under the tongue every 5 (five) minutes as needed.        . simvastatin (ZOCOR) 40 MG tablet Take 40 mg by mouth daily.       Past Medical History  Diagnosis Date  . Coronary artery disease   . Acute inferior myocardial infarction     stenting in 2002 to the RCA  . S/P coronary angioplasty   . Hypertension     LCX  . Hyperlipidemia   . Diabetes mellitus     type II  . Hypercholesterolemia   . Right bundle branch block   . Obesity   . Prostatitis, chronic   . Peyronie's disease   . Tinnitus   . Panic disorder   . Dupuytren's contracture of left hand   . Onychomycosis   . Bowen's disease    Past Surgical History  Procedure Date  . Coronary angioplasty 01/02/01    Acute inferior MI -- Stent placement in the proximal RCA (three separate stents totalling approximately 55 mm of stent with residual thrombus in the posterior descending artery) --  Relatively mild inferior basilar hypokinesia --Severe stenosis in a secondary marginal vessel of the left circumflex -- Minimal coronary atherosclerosis otherwise of the coronary system   . Nuclear stress test Oct 2011     Dense inferior infarct; mild ischemia with dilated LV. EF was 39%. Study was felt  to be unchanged and he has been managed medically  . Cholecystectomy    colonoscopy 2005 History   Social History  . Marital Status: Married    Spouse Name: Cordelia Pen            Occupational History  . 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 Smoking status: Former Smoker    Types: Cigarettes    Quit date: 05/16/1993  . Smokeless tobacco: Never Used  . Alcohol Use: Yes     occ.  . Drug Use: No  .            Social History Narrative   Daily caffeine    Family History  Problem Relation Age of Onset  . Heart attack Father   . Heart disease Father   . Diabetes Sister   . Colon cancer Neg Hx      Review of Systems This is positive for those things mentioned in the history of present illness. All other review of systems reviewed and are negative.    Objective:   Physical Exam General:  Well-developed, well-nourished and in no acute distress - obese Eyes:  anicteric. ENT:   Mouth and posterior pharynx free of lesions. Dentition fair to poor Neck:   supple w/o thyromegaly or mass.  Lungs: Clear to auscultation bilaterally. Heart:  S1S2, no rubs, murmurs, gallops. Abdomen:  soft, non-tender, no hepatosplenomegaly, hernia, or mass and BS+. No pain with straight leg raise, hip manipulation,  Rectal: No mass, prostate normal and nontender GU:   Normal prostate, normal scrotum and testes, uncirc penis no herniae Lymph:  no cervical or supraclavicular adenopathy. Extremities:   no edema Skin   no rash. Neuro:  A&O x 3.  Psych:  appropriate mood and  Affect.   Data Reviewed: 20 06/05/2011 primary care note. Comprehensive metabolic panel shows a glucose 151 and BUN 15 and a creatinine of 1.5, the latter is elevated. The top normal is 1.2. Liver tests normal. CBC normal. Urinalysis shows high specific gravity of 1.030, otherwise negative.     Assessment & Plan:   1. LLQ pain   2. Renal  insufficiency, mild    Based upon the initial presentation I think diverticulitis was a good  clinical consideration. However his antibiotics are almost complete and he is not better, and he is now describing relief with emptying his urinary bladder. Diverticulitis pressing on the bladder could cause that but his creatinine is elevated, do not know if that is chronic or new. Given the overall scenario I will pursue an unenhanced CT scan abdomen and pelvis without oral contrast looking for genitourinary abnormalities. I will repeat a basic metabolic panel to see what the creatinine is now a week later. Further plans pending that. He does not seem to have bowel symptoms, he has not had rectal bleeding or anything like that side not sure a colonoscopy would give [place] the answer though it could be required. I have explained this to the patient he understands and agrees to proceed with the planned.  I appreciate the opportunity to care for this patient. CC: Aura Dials, MD

## 2011-11-18 ENCOUNTER — Encounter: Payer: Self-pay | Admitting: Cardiology

## 2011-11-18 ENCOUNTER — Ambulatory Visit (INDEPENDENT_AMBULATORY_CARE_PROVIDER_SITE_OTHER): Payer: Medicare Other | Admitting: Cardiology

## 2011-11-18 VITALS — BP 145/80 | HR 88 | Ht 70.0 in | Wt 266.1 lb

## 2011-11-18 DIAGNOSIS — E785 Hyperlipidemia, unspecified: Secondary | ICD-10-CM

## 2011-11-18 DIAGNOSIS — I2510 Atherosclerotic heart disease of native coronary artery without angina pectoris: Secondary | ICD-10-CM

## 2011-11-18 DIAGNOSIS — E669 Obesity, unspecified: Secondary | ICD-10-CM

## 2011-11-18 DIAGNOSIS — I10 Essential (primary) hypertension: Secondary | ICD-10-CM

## 2011-11-18 NOTE — Progress Notes (Signed)
HPI The patient presents for followup of coronary disease. The patient presents as a new patient for me having previously been treated by Dr.Tennant.  Since he was last seen here he has had no new cardiovascular problems. He doesn't exercise routinely. However, he stays active doing some chores around his house. With this he denies any chest pressure, neck or arm discomfort. He has no significant shortness of breath, PND or orthopnea. He has no palpitations, presyncope or syncope.  No Known Allergies  Current Outpatient Prescriptions  Medication Sig Dispense Refill  . aspirin 81 MG tablet Take 81 mg by mouth daily.        . Blood Glucose Monitoring Suppl (ONE TOUCH ULTRA SYSTEM KIT) W/DEVICE KIT 1 kit by Does not apply route once. Take as Directed       . ciprofloxacin (CIPRO) 500 MG tablet Take 500 mg by mouth 2 (two) times daily.      . clotrimazole-betamethasone (LOTRISONE) cream Apply topically daily.      . Cyanocobalamin (VITAMIN B-12 PO) Take 1 tablet by mouth daily.        Marland Kitchen doxazosin (CARDURA) 8 MG tablet Take 8 mg by mouth daily.      . ergocalciferol (VITAMIN D2) 50000 UNITS capsule Take 50,000 Units by mouth once a week.      . ezetimibe (ZETIA) 10 MG tablet Take 10 mg by mouth daily.        . insulin glargine (LANTUS) 100 UNIT/ML injection Inject 50 Units into the skin daily.        . IRON PO Take 325 mg by mouth daily.        . isosorbide mononitrate (IMDUR) 30 MG 24 hr tablet TAKE 2 TABLETS PO DAILY  60 tablet  11  . Liraglutide 18 MG/3ML SOLN Inject into the skin daily.        Marland Kitchen lisinopril (PRINIVIL,ZESTRIL) 20 MG tablet Take 1 tablet (20 mg total) by mouth daily.  30 tablet  2  . meloxicam (MOBIC) 15 MG tablet Take 15 mg by mouth daily.      . metFORMIN (GLUCOPHAGE) 850 MG tablet Take 850 mg by mouth 3 (three) times daily.        . metroNIDAZOLE (FLAGYL) 500 MG tablet Take 500 mg by mouth 2 (two) times daily.      . simvastatin (ZOCOR) 40 MG tablet Take 40 mg by mouth  daily.      . nitroGLYCERIN (NITROSTAT) 0.4 MG SL tablet Place 0.4 mg under the tongue every 5 (five) minutes as needed.          Past Medical History  Diagnosis Date  . Coronary artery disease   . Acute inferior myocardial infarction     stenting in 2002 to the RCA  . S/P coronary angioplasty   . Hypertension     LCX  . Hyperlipidemia   . Diabetes mellitus     type II  . Hypercholesterolemia   . Right bundle branch block   . Obesity   . Prostatitis, chronic   . Peyronie's disease   . Tinnitus   . Panic disorder   . Dupuytren's contracture of left hand   . Onychomycosis   . Bowen's disease     Past Surgical History  Procedure Date  . Coronary angioplasty 01/02/01    Acute inferior MI -- Stent placement in the proximal RCA (three separate stents totalling approximately 55 mm of stent with residual thrombus in the posterior descending artery) --  Relatively mild inferior basilar hypokinesia --Severe stenosis in a secondary marginal vessel of the left circumflex -- Minimal coronary atherosclerosis otherwise of the coronary system   . Nuclear stress test Oct 2011     Dense inferior infarct; mild ischemia with dilated LV. EF was 39%. Study was felt to be unchanged and he has been managed medically  . Cholecystectomy   . Colonoscopy 03/09/2004    Normal    ROS:  As stated in the HPI and negative for all other systems.   PHYSICAL EXAM BP 145/80  Pulse 88  Ht 5\' 10"  (1.778 m)  Wt 266 lb 1.9 oz (120.711 kg)  BMI 38.18 kg/m2 GENERAL:  Well appearing HEENT:  Pupils equal round and reactive, fundi not visualized, oral mucosa unremarkable NECK:  No jugular venous distention, waveform within normal limits, carotid upstroke brisk and symmetric, no bruits, no thyromegaly LYMPHATICS:  No cervical, inguinal adenopathy LUNGS:  Clear to auscultation bilaterally BACK:  No CVA tenderness CHEST:  Unremarkable HEART:  PMI not displaced or sustained,S1 and S2 within normal limits, no S3,  no S4, no clicks, no rubs, no murmurs ABD:  Flat, positive bowel sounds normal in frequency in pitch, no bruits, no rebound, no guarding, no midline pulsatile mass, no hepatomegaly, no splenomegaly EXT:  2 plus pulses throughout, no edema, no cyanosis no clubbing SKIN:  No rashes no nodules  EKG:  Sinus rhythm, rate 88, right bundle branch block, old inferior infarct, premature ventricular contraction, no acute ST-T wave changes. 11/18/2011  ASSESSMENT AND PLAN

## 2011-11-18 NOTE — Assessment & Plan Note (Signed)
His blood pressure is elevated today but this is unusual.  No change in therapy is indicated.

## 2011-11-18 NOTE — Patient Instructions (Addendum)
Your physician wants you to follow-up in: 1 year. You will receive a reminder letter in the mail two months in advance. If you don't receive a letter, please call our office to schedule the follow-up appointment.  

## 2011-11-18 NOTE — Assessment & Plan Note (Signed)
The patient has no new sypmtoms.  No further cardiovascular testing is indicated.  We will continue with aggressive risk reduction and meds as listed.  

## 2011-11-18 NOTE — Assessment & Plan Note (Signed)
He has this followed closely by his primary provider and endocrinologist. I'll defer to their expertise.

## 2011-11-18 NOTE — Assessment & Plan Note (Signed)
The patient understands the need to lose weight with diet and exercise. We have discussed specific strategies for this.  

## 2011-11-29 ENCOUNTER — Encounter: Payer: Self-pay | Admitting: Cardiology

## 2011-12-19 ENCOUNTER — Other Ambulatory Visit: Payer: Self-pay | Admitting: Family Medicine

## 2011-12-19 DIAGNOSIS — R109 Unspecified abdominal pain: Secondary | ICD-10-CM

## 2011-12-20 ENCOUNTER — Ambulatory Visit
Admission: RE | Admit: 2011-12-20 | Discharge: 2011-12-20 | Disposition: A | Discharge status: Home / Self Care | Payer: Medicare Other | Source: Ambulatory Visit | Attending: Family Medicine | Admitting: Family Medicine

## 2011-12-20 DIAGNOSIS — R109 Unspecified abdominal pain: Secondary | ICD-10-CM

## 2011-12-20 MED ORDER — IOHEXOL 300 MG/ML  SOLN
125.0000 mL | Freq: Once | INTRAMUSCULAR | Status: AC | PRN
  Administered 2011-12-20: 125 mL via INTRAVENOUS

## 2011-12-28 ENCOUNTER — Other Ambulatory Visit: Payer: Self-pay | Admitting: *Deleted

## 2011-12-28 DIAGNOSIS — I2510 Atherosclerotic heart disease of native coronary artery without angina pectoris: Secondary | ICD-10-CM

## 2011-12-28 MED ORDER — ISOSORBIDE MONONITRATE ER 30 MG PO TB24: ORAL_TABLET | ORAL | Status: DC

## 2012-01-27 ENCOUNTER — Other Ambulatory Visit: Payer: Self-pay | Admitting: *Deleted

## 2012-01-27 MED ORDER — LISINOPRIL 20 MG PO TABS: 20.0000 mg | ORAL_TABLET | Freq: Every day | ORAL | Status: AC

## 2012-02-01 DIAGNOSIS — M549 Dorsalgia, unspecified: Secondary | ICD-10-CM | POA: Insufficient documentation

## 2012-09-24 ENCOUNTER — Encounter: Payer: Self-pay | Admitting: Cardiology

## 2012-09-24 ENCOUNTER — Encounter: Payer: Self-pay | Admitting: Internal Medicine

## 2012-10-15 ENCOUNTER — Other Ambulatory Visit: Payer: Self-pay | Admitting: *Deleted

## 2012-10-15 MED ORDER — NITROGLYCERIN 0.4 MG SL SUBL: 0.4000 mg | SUBLINGUAL_TABLET | SUBLINGUAL | Status: AC | PRN

## 2012-12-06 ENCOUNTER — Ambulatory Visit: Payer: Medicare Other | Admitting: Cardiology

## 2013-02-06 DIAGNOSIS — N138 Other obstructive and reflux uropathy: Secondary | ICD-10-CM | POA: Insufficient documentation

## 2013-02-06 DIAGNOSIS — R000 Tachycardia, unspecified: Secondary | ICD-10-CM | POA: Insufficient documentation

## 2013-02-06 DIAGNOSIS — R413 Other amnesia: Secondary | ICD-10-CM | POA: Insufficient documentation

## 2013-02-06 DIAGNOSIS — G4700 Insomnia, unspecified: Secondary | ICD-10-CM | POA: Insufficient documentation

## 2013-02-07 ENCOUNTER — Encounter: Payer: Self-pay | Admitting: Cardiology

## 2013-02-07 ENCOUNTER — Ambulatory Visit (INDEPENDENT_AMBULATORY_CARE_PROVIDER_SITE_OTHER): Payer: Medicare Other | Admitting: Cardiology

## 2013-02-07 VITALS — BP 120/69 | HR 120 | Ht 70.0 in | Wt 266.0 lb

## 2013-02-07 DIAGNOSIS — I10 Essential (primary) hypertension: Secondary | ICD-10-CM

## 2013-02-07 DIAGNOSIS — I2510 Atherosclerotic heart disease of native coronary artery without angina pectoris: Secondary | ICD-10-CM

## 2013-02-07 NOTE — Patient Instructions (Addendum)
The current medical regimen is effective;  continue present plan and medications.  Follow up in 1 year with Dr Hochrein.  You will receive a letter in the mail 2 months before you are due.  Please call us when you receive this letter to schedule your follow up appointment.  

## 2013-02-07 NOTE — Progress Notes (Signed)
HPI The patient presents for followup of coronary disease. He was previously seen by Dr.Tennant.  Since he was last seen here he has had no new cardiovascular problems. He does exercise routinely. With this he denies any chest pressure, neck or arm discomfort. He has no significant shortness of breath, PND or orthopnea. He has no palpitations, presyncope or syncope.  He has not the symptoms he had with his previous coronary disease. Interestingly he went to see his primary doctor yesterday and had an EKG. This demonstrates his usual right bundle branch block but with an increased rate and suggests atrial flutter. He is not noticing his heart rate. He would not know that it was elevated.  No Known Allergies  Current Outpatient Prescriptions  Medication Sig Dispense Refill  . aspirin 81 MG tablet Take 81 mg by mouth daily.        . Blood Glucose Monitoring Suppl (ONE TOUCH ULTRA SYSTEM KIT) W/DEVICE KIT 1 kit by Does not apply route once. Take as Directed       . ciprofloxacin (CIPRO) 500 MG tablet Take 500 mg by mouth 2 (two) times daily.      . clotrimazole-betamethasone (LOTRISONE) cream Apply topically daily.      . Cyanocobalamin (VITAMIN B-12 PO) Take 1 tablet by mouth daily.        Marland Kitchen doxazosin (CARDURA) 8 MG tablet Take 8 mg by mouth daily.      . ergocalciferol (VITAMIN D2) 50000 UNITS capsule Take 50,000 Units by mouth once a week.      . ezetimibe (ZETIA) 10 MG tablet Take 10 mg by mouth daily.        . insulin glargine (LANTUS) 100 UNIT/ML injection Inject 50 Units into the skin daily.        . IRON PO Take 325 mg by mouth daily.        . isosorbide mononitrate (IMDUR) 30 MG 24 hr tablet TAKE 2 TABLETS PO DAILY  60 tablet  11  . Liraglutide 18 MG/3ML SOLN Inject into the skin daily.        Marland Kitchen lisinopril (PRINIVIL,ZESTRIL) 20 MG tablet Take 1 tablet (20 mg total) by mouth daily.  30 tablet  6  . meloxicam (MOBIC) 15 MG tablet Take 15 mg by mouth daily.      . metFORMIN (GLUCOPHAGE)  850 MG tablet Take 850 mg by mouth 3 (three) times daily.        . metroNIDAZOLE (FLAGYL) 500 MG tablet Take 500 mg by mouth 2 (two) times daily.      . nitroGLYCERIN (NITROSTAT) 0.4 MG SL tablet Place 1 tablet (0.4 mg total) under the tongue every 5 (five) minutes as needed.  25 tablet  1  . simvastatin (ZOCOR) 40 MG tablet Take 40 mg by mouth daily.       No current facility-administered medications for this visit.    Past Medical History  Diagnosis Date  . Coronary artery disease   . Acute inferior myocardial infarction     stenting in 2002 to the RCA  . S/P coronary angioplasty   . Hypertension     LCX  . Hyperlipidemia   . Diabetes mellitus     type II  . Hypercholesterolemia   . Right bundle branch block   . Obesity   . Prostatitis, chronic   . Peyronie's disease   . Tinnitus   . Panic disorder   . Dupuytren's contracture of left hand   . Onychomycosis   .  Bowen's disease     Past Surgical History  Procedure Laterality Date  . Coronary angioplasty  01/02/01    Acute inferior MI -- Stent placement in the proximal RCA (three separate stents totalling approximately 55 mm of stent with residual thrombus in the posterior descending artery) --  Relatively mild inferior basilar hypokinesia --Severe stenosis in a secondary marginal vessel of the left circumflex -- Minimal coronary atherosclerosis otherwise of the coronary system   . Nuclear stress test  Oct 2011     Dense inferior infarct; mild ischemia with dilated LV. EF was 39%. Study was felt to be unchanged and he has been managed medically  . Cholecystectomy    . Colonoscopy  03/09/2004    Normal    ROS:  As stated in the HPI and negative for all other systems.   PHYSICAL EXAM BP 120/69  Pulse 120  Ht 5\' 10"  (1.778 m)  Wt 266 lb (120.657 kg)  BMI 38.17 kg/m2 GENERAL:  Well appearing HEENT:  Pupils equal round and reactive, fundi not visualized, oral mucosa unremarkable NECK:  No jugular venous distention,  waveform within normal limits, carotid upstroke brisk and symmetric, no bruits, no thyromegaly LYMPHATICS:  No cervical, inguinal adenopathy LUNGS:  Clear to auscultation bilaterally BACK:  No CVA tenderness CHEST:  Unremarkable HEART:  PMI not displaced or sustained,S1 and S2 within normal limits, no S3, no S4, no clicks, no rubs, no murmurs ABD:  Flat, positive bowel sounds normal in frequency in pitch, no bruits, no rebound, no guarding, no midline pulsatile mass, no hepatomegaly, no splenomegaly EXT:  2 plus pulses throughout, no edema, no cyanosis no clubbing SKIN:  No rashes no nodules  EKG:  Sinus rhythm, rate 96, right bundle branch block, old inferior infarct, premature ventricular contraction, no acute ST-T wave changes. 02/07/2013  ASSESSMENT AND PLAN  CAD:  The patient has no new sypmtoms.  No further cardiovascular testing is indicated.  We will continue with aggressive risk reduction and meds as listed.  He has no new symptoms since a stress test in 2011. I might repeat this when I see him again in the future.  TACHYCARDIA:   His heart rate today was down. At this point no change in therapy is indicated.   OBESITY:  The patient understands the need to lose weight with diet and exercise. We have discussed specific strategies for this.

## 2013-05-31 ENCOUNTER — Other Ambulatory Visit: Payer: Self-pay

## 2013-05-31 DIAGNOSIS — I2510 Atherosclerotic heart disease of native coronary artery without angina pectoris: Secondary | ICD-10-CM

## 2013-05-31 MED ORDER — ISOSORBIDE MONONITRATE ER 30 MG PO TB24
ORAL_TABLET | ORAL | Status: AC
Start: 2013-05-31 — End: ?

## 2013-07-18 DIAGNOSIS — Z0000 Encounter for general adult medical examination without abnormal findings: Secondary | ICD-10-CM | POA: Insufficient documentation

## 2013-08-09 DIAGNOSIS — I2510 Atherosclerotic heart disease of native coronary artery without angina pectoris: Secondary | ICD-10-CM | POA: Insufficient documentation

## 2013-08-09 DIAGNOSIS — E119 Type 2 diabetes mellitus without complications: Secondary | ICD-10-CM | POA: Insufficient documentation

## 2013-08-09 DIAGNOSIS — L570 Actinic keratosis: Secondary | ICD-10-CM

## 2013-08-09 DIAGNOSIS — Z136 Encounter for screening for cardiovascular disorders: Secondary | ICD-10-CM | POA: Insufficient documentation

## 2013-08-09 DIAGNOSIS — I252 Old myocardial infarction: Secondary | ICD-10-CM | POA: Insufficient documentation

## 2013-08-09 HISTORY — DX: Actinic keratosis: L57.0

## 2014-02-06 ENCOUNTER — Ambulatory Visit (INDEPENDENT_AMBULATORY_CARE_PROVIDER_SITE_OTHER): Payer: Medicare Other | Admitting: Cardiology

## 2014-02-06 ENCOUNTER — Encounter: Payer: Self-pay | Admitting: Cardiology

## 2014-02-06 VITALS — BP 114/64 | HR 104 | Ht 69.0 in | Wt 274.0 lb

## 2014-02-06 DIAGNOSIS — I2510 Atherosclerotic heart disease of native coronary artery without angina pectoris: Secondary | ICD-10-CM

## 2014-02-06 DIAGNOSIS — I10 Essential (primary) hypertension: Secondary | ICD-10-CM

## 2014-02-06 DIAGNOSIS — E119 Type 2 diabetes mellitus without complications: Secondary | ICD-10-CM

## 2014-02-06 NOTE — Patient Instructions (Signed)
Your physician recommends that you schedule a follow-up appointment after your stress test in [place]. 2016

## 2014-02-06 NOTE — Progress Notes (Signed)
HPI The patient presents for followup of coronary disease.  Since he was last seen here he has had no new cardiovascular problems. He does exercise routinely at the [facility].   With this he denies any chest pressure, neck or arm discomfort. He has no  PND or orthopnea. He has no palpitations, presyncope or syncope.  He has not the symptoms he had with his previous coronary disease.  He does have sinus tachycardia.   He is not noticing his heart rate. He does have mild DOE and decreased exercise tolerance.  He has gained weight.  He is somewhat limited by back pain.  No Known Allergies  Current Outpatient Prescriptions  Medication Sig Dispense Refill  . aspirin 81 MG tablet Take 81 mg by mouth daily.        . Blood Glucose Monitoring Suppl (ONE TOUCH ULTRA SYSTEM KIT) W/DEVICE KIT 1 kit by Does not apply route once. Take as Directed       . Cyanocobalamin (VITAMIN B-12 PO) Take 1 tablet by mouth daily.        Marland Kitchen doxazosin (CARDURA) 8 MG tablet Take 8 mg by mouth daily.      . ergocalciferol (VITAMIN D2) 50000 UNITS capsule Take 50,000 Units by mouth once a week.      . ezetimibe (ZETIA) 10 MG tablet Take 10 mg by mouth daily.        . insulin aspart (NOVOLOG) 100 UNIT/ML injection Inject 5 Units into the skin as needed for high blood sugar.      . insulin glargine (LANTUS) 100 UNIT/ML injection Inject 50 Units into the skin daily.        . IRON PO Take 325 mg by mouth daily.        . isosorbide mononitrate (IMDUR) 30 MG 24 hr tablet TAKE 2 TABLETS PO DAILY  60 tablet  9  . lisinopril (PRINIVIL,ZESTRIL) 20 MG tablet Take 1 tablet (20 mg total) by mouth daily.  30 tablet  6  . metFORMIN (GLUCOPHAGE) 850 MG tablet Take 850 mg by mouth 3 (three) times daily.        . nitroGLYCERIN (NITROSTAT) 0.4 MG SL tablet Place 1 tablet (0.4 mg total) under the tongue every 5 (five) minutes as needed.  25 tablet  1  . simvastatin (ZOCOR) 40 MG tablet Take 40 mg by mouth daily.       No current  facility-administered medications for this visit.    Past Medical History  Diagnosis Date  . Coronary artery disease   . Acute inferior myocardial infarction     stenting in 2002 to the RCA  . S/P coronary angioplasty   . Hypertension     LCX  . Hyperlipidemia   . Diabetes mellitus     type II  . Hypercholesterolemia   . Right bundle branch block   . Obesity   . Prostatitis, chronic   . Peyronie's disease   . Tinnitus   . Panic disorder   . Dupuytren's contracture of left hand   . Onychomycosis   . Bowen's disease     Past Surgical History  Procedure Laterality Date  . Coronary angioplasty  01/02/01    Acute inferior MI -- Stent placement in the proximal RCA (three separate stents totalling approximately 55 mm of stent with residual thrombus in the posterior descending artery) --  Relatively mild inferior basilar hypokinesia --Severe stenosis in a secondary marginal vessel of the left circumflex -- Minimal coronary atherosclerosis otherwise  of the coronary system   . Nuclear stress test  Oct 2011     Dense inferior infarct; mild ischemia with dilated LV. EF was 39%. Study was felt to be unchanged and he has been managed medically  . Cholecystectomy    . Colonoscopy  03/09/2004    Normal    ROS:  As stated in the HPI and negative for all other systems.   PHYSICAL EXAM BP 114/64  Pulse 104  Ht _0  (1.753 m)  Wt 274 lb (124.286 kg)  BMI 40.44 kg/m2 GENERAL:  Well appearing HEENT:  Pupils equal round and reactive, fundi not visualized, oral mucosa unremarkable NECK:  No jugular venous distention, waveform within normal limits, carotid upstroke brisk and symmetric, no bruits, no thyromegaly LYMPHATICS:  No cervical, inguinal adenopathy LUNGS:  Clear to auscultation bilaterally BACK:  No CVA tenderness CHEST:  Unremarkable HEART:  PMI not displaced or sustained,S1 and S2 within normal limits, no S3, no S4, no clicks, no rubs, no murmurs ABD:  Flat, positive bowel  sounds normal in frequency in pitch, no bruits, no rebound, no guarding, no midline pulsatile mass, no hepatomegaly, no splenomegaly EXT:  2 plus pulses throughout, no edema, no cyanosis no clubbing SKIN:  No rashes no nodules  EKG:  Sinus rhythm, rate 104 , right bundle branch block, old inferior infarct, premature ventricular contraction, no acute ST-T wave changes. 02/06/2014  ASSESSMENT AND PLAN  CAD:  The patient does have some dyspnea and decreased exercise tolerance.  Stress testing is indicated.  I don't think that he could walk a treadmill.  Therefore he will have a [employer].  TACHYCARDIA:   He has had An elevated heart rate for some time. However, there is no specific therapy are indicated for this.  OBESITY:  The patient understands the need to lose weight with diet and exercise. We have discussed specific strategies for this.

## 2014-02-10 DIAGNOSIS — H6120 Impacted cerumen, unspecified ear: Secondary | ICD-10-CM | POA: Insufficient documentation

## 2014-03-20 ENCOUNTER — Telehealth: Payer: Self-pay | Admitting: *Deleted

## 2014-03-20 NOTE — Telephone Encounter (Signed)
Jesse Wheeler or [name],  This pt is coming in for PV on 03-24-14 and his procedure is 04-07-14.  Please review his chart- per his OV note on 02-06-14, he had an MI in 2002 and his last EF in 2011 was 39%.  Cardiologist recommended a [employer], which I do not see that pt has had.  Note states that he does have DOE.  Please advise  Thanks, Cyril Mourning

## 2014-03-24 ENCOUNTER — Ambulatory Visit (AMBULATORY_SURGERY_CENTER): Payer: Self-pay | Admitting: *Deleted

## 2014-03-24 VITALS — Ht 70.0 in | Wt 275.0 lb

## 2014-03-24 DIAGNOSIS — Z1211 Encounter for screening for malignant neoplasm of colon: Secondary | ICD-10-CM

## 2014-03-24 NOTE — Telephone Encounter (Signed)
Patient was here for pre-visit. Patient denies any heart or breathing problems at this time. He states he has to do the Myoview every 3 years. Patient also states that his heart doctor told him the Myoview was not going to be set up until the first of the new year. Thanks.

## 2014-03-24 NOTE — Progress Notes (Signed)
Patient denies any allergies to eggs or soy. Patient denies any problems with anesthesia/sedation. Patient denies any oxygen use at home and does not take any diet/weight loss medications. EMMI education assisgned to patient on colonoscopy, this was explained and instructions given to patient. 

## 2014-03-25 ENCOUNTER — Encounter: Payer: Self-pay | Admitting: Internal Medicine

## 2014-03-26 NOTE — Telephone Encounter (Signed)
Robbin,  I spoke to Mr. Buckwalter.  He reports having a robust activity level and no periods of SOB.  He is cleared for care at [facility].  Thanks,  Jenny Reichmann

## 2014-03-26 NOTE — Telephone Encounter (Signed)
Jesse Wheeler,  I have reviewed Jesse Wheeler chart.  We would prefer that he has the Myoview prior to his exam at [facility].  I have called him to assess his current activity level but he did not answer.  I will try to talk to him tomorrow and provide you with a definitive answer.  Thanks,  Jesse Wheeler

## 2014-04-07 ENCOUNTER — Ambulatory Visit (AMBULATORY_SURGERY_CENTER): Payer: Medicare Other | Admitting: Internal Medicine

## 2014-04-07 ENCOUNTER — Encounter: Payer: Self-pay | Admitting: Internal Medicine

## 2014-04-07 VITALS — BP 162/71 | HR 77 | Temp 98.6°F | Resp 17 | Ht 70.0 in | Wt 275.0 lb

## 2014-04-07 DIAGNOSIS — D128 Benign neoplasm of rectum: Secondary | ICD-10-CM

## 2014-04-07 DIAGNOSIS — Z1211 Encounter for screening for malignant neoplasm of colon: Secondary | ICD-10-CM

## 2014-04-07 LAB — GLUCOSE, CAPILLARY
GLUCOSE-CAPILLARY: 122 mg/dL — AB (ref 70–99)
Glucose-Capillary: 95 mg/dL (ref 70–99)

## 2014-04-07 MED ORDER — SODIUM CHLORIDE 0.9 % IV SOLN: 500.0000 mL | INTRAVENOUS | Status: DC

## 2014-04-07 NOTE — Patient Instructions (Addendum)
I found and removed one tiny polyp. Everything else was ok.  I will let you know pathology results and if to have another routine colonoscopy by mail.  I appreciate the opportunity to care for you. Gatha Mayer, MD, FACG   YOU HAD AN ENDOSCOPIC PROCEDURE TODAY AT [facility]: Refer to the procedure report that was given to you for any specific questions about what was found during the examination.  If the procedure report does not answer your questions, please call your gastroenterologist to clarify.  If you requested that your care partner not be given the details of your procedure findings, then the procedure report has been included in a sealed envelope for you to review at your convenience later.  YOU SHOULD EXPECT: Some feelings of bloating in the abdomen. Passage of more gas than usual.  Walking can help get rid of the air that was put into your GI tract during the procedure and reduce the bloating. If you had a lower endoscopy (such as a colonoscopy or flexible sigmoidoscopy) you may notice spotting of blood in your stool or on the toilet paper. If you underwent a bowel prep for your procedure, then you may not have a normal bowel movement for a few days.  DIET: Your first meal following the procedure should be a light meal and then it is ok to progress to your normal diet.  A half-sandwich or bowl of soup is an example of a good first meal.  Heavy or fried foods are harder to digest and may make you feel nauseous or bloated.  Likewise meals heavy in dairy and vegetables can cause extra gas to form and this can also increase the bloating.  Drink plenty of fluids but you should avoid alcoholic beverages for 24 hours.  ACTIVITY: Your care partner should take you home directly after the procedure.  You should plan to take it easy, moving slowly for the rest of the day.  You can resume normal activity the day after the procedure however you should NOT DRIVE or use heavy  machinery for 24 hours (because of the sedation medicines used during the test).    SYMPTOMS TO REPORT IMMEDIATELY: A gastroenterologist can be reached at any hour.  During normal business hours, 8:30 AM to 5:00 PM Monday through Friday, call [phone].  After hours and on weekends, please call the GI answering service at [phone] who will take a message and have the physician on call contact you.   Following lower endoscopy (colonoscopy or flexible sigmoidoscopy):  Excessive amounts of blood in the stool  Significant tenderness or worsening of abdominal pains  Swelling of the abdomen that is new, acute  Fever of 100F or higher  Following upper endoscopy (EGD)  Vomiting of blood or coffee ground material  New chest pain or pain under the shoulder blades  Painful or persistently difficult swallowing  New shortness of breath  Fever of 100F or higher  Black, tarry-looking stools  FOLLOW UP: If any biopsies were taken you will be contacted by phone or by letter within the next 1-3 weeks.  Call your gastroenterologist if you have not heard about the biopsies in 3 weeks.  Our staff will call the home number listed on your records the next business day following your procedure to check on you and address any questions or concerns that you may have at that time regarding the information given to you following your procedure. This is a Manufacturing engineer  call and so if there is no answer at the home number and we have not heard from you through the emergency physician on call, we will assume that you have returned to your regular daily activities without incident.  SIGNATURES/CONFIDENTIALITY: You and/or your care partner have signed paperwork which will be entered into your electronic medical record.  These signatures attest to the fact that that the information above on your After Visit Summary has been reviewed and is understood.  Full responsibility of the confidentiality of this discharge  information lies with you and/or your care-partner.YOU HAD AN ENDOSCOPIC PROCEDURE TODAY AT [facility]: Refer to the procedure report that was given to you for any specific questions about what was found during the examination.  If the procedure report does not answer your questions, please call your gastroenterologist to clarify.  If you requested that your care partner not be given the details of your procedure findings, then the procedure report has been included in a sealed envelope for you to review at your convenience later.  YOU SHOULD EXPECT: Some feelings of bloating in the abdomen. Passage of more gas than usual.  Walking can help get rid of the air that was put into your GI tract during the procedure and reduce the bloating. If you had a lower endoscopy (such as a colonoscopy or flexible sigmoidoscopy) you may notice spotting of blood in your stool or on the toilet paper. If you underwent a bowel prep for your procedure, then you may not have a normal bowel movement for a few days.  DIET: Your first meal following the procedure should be a light meal and then it is ok to progress to your normal diet.  A half-sandwich or bowl of soup is an example of a good first meal.  Heavy or fried foods are harder to digest and may make you feel nauseous or bloated.  Likewise meals heavy in dairy and vegetables can cause extra gas to form and this can also increase the bloating.  Drink plenty of fluids but you should avoid alcoholic beverages for 24 hours.  ACTIVITY: Your care partner should take you home directly after the procedure.  You should plan to take it easy, moving slowly for the rest of the day.  You can resume normal activity the day after the procedure however you should NOT DRIVE or use heavy machinery for 24 hours (because of the sedation medicines used during the test).    SYMPTOMS TO REPORT IMMEDIATELY: A gastroenterologist can be reached at any hour.  During normal business  hours, 8:30 AM to 5:00 PM Monday through Friday, call [phone].  After hours and on weekends, please call the GI answering service at [phone] who will take a message and have the physician on call contact you.   Following lower endoscopy (colonoscopy or flexible sigmoidoscopy):  Excessive amounts of blood in the stool  Significant tenderness or worsening of abdominal pains  Swelling of the abdomen that is new, acute  Fever of 100F or higher   FOLLOW UP: If any biopsies were taken you will be contacted by phone or by letter within the next 1-3 weeks.  Call your gastroenterologist if you have not heard about the biopsies in 3 weeks.  Our staff will call the home number listed on your records the next business day following your procedure to check on you and address any questions or concerns that you may have at that time regarding the information given  to you following your procedure. This is a courtesy call and so if there is no answer at the home number and we have not heard from you through the emergency physician on call, we will assume that you have returned to your regular daily activities without incident.  SIGNATURES/CONFIDENTIALITY: You and/or your care partner have signed paperwork which will be entered into your electronic medical record.  These signatures attest to the fact that that the information above on your After Visit Summary has been reviewed and is understood.  Full responsibility of the confidentiality of this discharge information lies with you and/or your care-partner.  Polyp information given.

## 2014-04-07 NOTE — Progress Notes (Signed)
Called to room to assist during endoscopic procedure.  Patient ID and intended procedure confirmed with present staff. Received instructions for my participation in the procedure from the performing physician.  

## 2014-04-07 NOTE — Op Note (Signed)
[facility]  [address] [place], [address]   COLONOSCOPY PROCEDURE REPORT  PATIENT: Jesse Wheeler, Jesse Wheeler  MR#: [id number] BIRTHDATE: 02/02/1941 , 73  yrs. old GENDER: male ENDOSCOPIST: Gatha Mayer, MD, [facility] REFERRED BY: PROCEDURE DATE:  04/07/2014 PROCEDURE:   Colonoscopy with biopsy First Screening Colonoscopy - Avg.  risk and is 50 yrs.  old or older - No.  Prior Negative Screening - Now for repeat screening. N/A  History of Adenoma - Now for follow-up colonoscopy & has been > or = to 3 yrs.  N/A  Polyps Removed Today? Yes. ASA CLASS:   Class III INDICATIONS:average risk for colorectal cancer. MEDICATIONS: Propofol 200 mg IV and Monitored anesthesia care  DESCRIPTION OF PROCEDURE:   After the risks benefits and alternatives of the procedure were thoroughly explained, informed consent was obtained.  The digital rectal exam revealed no abnormalities of the rectum, revealed no prostatic nodules, and revealed the prostate was not enlarged.   The LB [id number] endoscope was introduced through the anus and advanced to the cecum, which was identified by both the appendix and ileocecal valve. No adverse events experienced.   The quality of the prep was good, using MiraLax  The instrument was then slowly withdrawn as the colon was fully examined.      COLON FINDINGS: A sessile polyp measuring 2 mm in size was found in the rectum.  A polypectomy was performed with cold forceps.  The resection was complete, the polyp tissue was completely retrieved and sent to histology.   The examination was otherwise normal. Retroflexed views revealed no abnormalities. The time to cecum=2 minutes 45 seconds.  Withdrawal time=11 minutes 34 seconds.  The scope was withdrawn and the procedure completed. COMPLICATIONS: There were no immediate complications.  ENDOSCOPIC IMPRESSION: 1.   Sessile polyp measuring 2 mm in size was found in the rectum; polypectomy was performed with  cold forceps 2.   The examination was otherwise normal - good prep  RECOMMENDATIONS: 1.  Await pathology results 2.  May not need another routine colonoscopy  eSigned:  Gatha Mayer, MD, [facility] 04/07/2014 11:36 AM   cc:     Bernerd Limbo, MD and The Patient

## 2014-04-07 NOTE — Progress Notes (Signed)
Report to PACU, RN, vss, BBS= Clear.  

## 2014-04-08 ENCOUNTER — Telehealth: Payer: Self-pay | Admitting: *Deleted

## 2014-04-08 NOTE — Telephone Encounter (Signed)
  Follow up Call-  Call back number 04/07/2014  Post procedure Call Back phone  # [phone]  Permission to leave phone message Yes     Patient questions:  Do you have a fever, pain , or abdominal swelling? No. Pain Score  0 *  Have you tolerated food without any problems? Yes.    Have you been able to return to your normal activities? Yes.    Do you have any questions about your discharge instructions: Diet   No. Medications  No. Follow up visit  No.  Do you have questions or concerns about your Care? No.  Actions: * If pain score is 4 or above: No action needed, pain <4.

## 2014-04-14 ENCOUNTER — Encounter: Payer: Self-pay | Admitting: Internal Medicine

## 2014-04-14 DIAGNOSIS — Z860100 Personal history of colon polyps, unspecified: Secondary | ICD-10-CM

## 2014-04-14 HISTORY — DX: Personal history of colonic polyps: Z86.010

## 2014-04-14 HISTORY — DX: Personal history of colon polyps, unspecified: Z86.0100

## 2014-04-14 NOTE — Progress Notes (Signed)
Quick Note:  2 mm adenoma Can consider repeating a colonoscopy 5-10 yrs but will be 78+ ______

## 2014-05-23 ENCOUNTER — Encounter: Payer: Medicare Other | Admitting: Cardiology

## 2014-05-23 ENCOUNTER — Encounter: Payer: Self-pay | Admitting: Cardiology

## 2014-05-23 VITALS — BP 131/71 | HR 108 | Ht 69.0 in | Wt 276.6 lb

## 2014-05-23 DIAGNOSIS — I2510 Atherosclerotic heart disease of native coronary artery without angina pectoris: Secondary | ICD-10-CM

## 2014-05-23 DIAGNOSIS — I10 Essential (primary) hypertension: Secondary | ICD-10-CM

## 2014-05-23 NOTE — Progress Notes (Signed)
Error

## 2014-05-27 ENCOUNTER — Telehealth (HOSPITAL_COMMUNITY): Payer: Self-pay

## 2014-05-27 ENCOUNTER — Ambulatory Visit: Payer: Medicare Other | Admitting: Cardiology

## 2014-05-27 NOTE — Telephone Encounter (Signed)
Encounter complete. 

## 2014-05-29 ENCOUNTER — Ambulatory Visit (HOSPITAL_COMMUNITY)
Admission: RE | Admit: 2014-05-29 | Discharge: 2014-05-29 | Disposition: A | Discharge status: Home / Self Care | Payer: Medicare Other | Source: Ambulatory Visit | Attending: Cardiology | Admitting: Cardiology

## 2014-05-29 VITALS — Ht 69.0 in | Wt 274.0 lb

## 2014-05-29 DIAGNOSIS — R5383 Other fatigue: Secondary | ICD-10-CM | POA: Insufficient documentation

## 2014-05-29 DIAGNOSIS — R0609 Other forms of dyspnea: Secondary | ICD-10-CM | POA: Diagnosis not present

## 2014-05-29 DIAGNOSIS — Z8249 Family history of ischemic heart disease and other diseases of the circulatory system: Secondary | ICD-10-CM | POA: Insufficient documentation

## 2014-05-29 DIAGNOSIS — I10 Essential (primary) hypertension: Secondary | ICD-10-CM

## 2014-05-29 DIAGNOSIS — E785 Hyperlipidemia, unspecified: Secondary | ICD-10-CM | POA: Diagnosis not present

## 2014-05-29 DIAGNOSIS — E669 Obesity, unspecified: Secondary | ICD-10-CM | POA: Diagnosis not present

## 2014-05-29 DIAGNOSIS — Z87891 Personal history of nicotine dependence: Secondary | ICD-10-CM | POA: Diagnosis not present

## 2014-05-29 DIAGNOSIS — E119 Type 2 diabetes mellitus without complications: Secondary | ICD-10-CM | POA: Diagnosis not present

## 2014-05-29 DIAGNOSIS — I2510 Atherosclerotic heart disease of native coronary artery without angina pectoris: Secondary | ICD-10-CM | POA: Diagnosis not present

## 2014-05-29 DIAGNOSIS — R55 Syncope and collapse: Secondary | ICD-10-CM | POA: Insufficient documentation

## 2014-05-29 MED ORDER — REGADENOSON 0.4 MG/5ML IV SOLN
0.4000 mg | Freq: Once | INTRAVENOUS | Status: AC
  Administered 2014-05-29: 0.4 mg via INTRAVENOUS

## 2014-05-29 MED ORDER — TECHNETIUM TC 99M SESTAMIBI GENERIC - CARDIOLITE
10.8000 | Freq: Once | INTRAVENOUS | Status: AC | PRN
  Administered 2014-05-29: 11 via INTRAVENOUS

## 2014-05-29 MED ORDER — TECHNETIUM TC 99M SESTAMIBI GENERIC - CARDIOLITE
32.7000 | Freq: Once | INTRAVENOUS | Status: AC | PRN
  Administered 2014-05-29: 32.7 via INTRAVENOUS

## 2014-05-29 NOTE — Procedures (Addendum)
[facility] [address] [place] [address] [phone]  Cardiology Nuclear Med Study  Jesse Wheeler is a 74 y.o. male     MRN : [id number]     DOB: April 15, 1941  Procedure Date: 05/29/2014  Nuclear Med Background Indication for Stress Test:  Follow up CAD History:  MI;PTCA's x3;No prior respiratory history reported;Pt states he has had NUC MPI on [address]None in Epic Cardiac Risk Factors: Family History - CAD, History of Smoking, Hypertension, IDDM Type 2, Lipids and Obesity  Symptoms:  DOE, Fatigue and Near Syncope   Nuclear Pre-Procedure Caffeine/Decaff Intake:  7:00pm NPO After: 5:00am   IV Site: R Forearm  IV 0.9% NS with Angio Cath:  22g  Chest Size (in):  50" IV Started by: Rolene Course, RN  Height: 5\' 9"  (1.753 m)  Cup Size: n/a  BMI:  Body mass index is 40.44 kg/(m^2). Weight:  274 lb (124.286 kg)   Tech Comments:  n/a    Nuclear Med Study 1 or 2 day study: 1 day  Stress Test Type:  [place] Provider:  Minus Breeding, MD   Resting Radionuclide: Technetium 24m Sestamibi  Resting Radionuclide Dose: 10.8 mCi   Stress Radionuclide:  Technetium 90m Sestamibi  Stress Radionuclide Dose: 32.7 mCi           Stress Protocol Rest HR: 100 Stress HR: 117  Rest BP: 111/47 Stress BP: 123/72  Exercise Time (min): n/a METS: n/a   Predicted Max HR: 148 bpm % Max HR: 79.05 bpm Rate Pressure Product: 14625  Dose of Adenosine (mg):  n/a Dose of Lexiscan: 0.4 mg  Dose of Atropine (mg): n/a Dose of Dobutamine: n/a mcg/kg/min (at max HR)  Stress Test Technologist: Leane Para, CCT Nuclear Technologist: Imagene Riches, CNMT   Rest Procedure:  Myocardial perfusion imaging was performed at rest 45 minutes following the intravenous administration of Technetium 77m Sestamibi. Stress Procedure:  The patient received IV Lexiscan 0.4 mg over 15-seconds.  Technetium 90m Sestamibi injected IV at 30-seconds.   There were no significant changes with Lexiscan.  Quantitative spect images were obtained after a 45 minute delay.  Transient Ischemic Dilatation (Normal <1.22): 1.47  QGS EDV:  156 ml QGS ESV:  94 ml LV Ejection Fraction: 40%    Rest ECG: Sinus with PVC, RBBB, nonspecific ST changes.  Stress ECG: No significant ST segment change suggestive of ischemia.  QPS Raw Data Images:  Acquisition technically good; normal left ventricular size. Stress Images:  There is decreased uptake in the inferior lateral wall. Rest Images:  There is decreased uptake in the inferior lateral wall, less prominent compared to the stress images. Subtraction (SDS):  These findings are consistent with prior inferior lateral infarct and mild peri-infarct ischemia.  Impression Exercise Capacity:  Lexiscan with no exercise. BP Response:  Normal blood pressure response. Clinical Symptoms:  There is dyspnea. ECG Impression:  No significant ST segment change suggestive of ischemia. Comparison with Prior Nuclear Study: No images to compare  Overall Impression:  Intermediate risk stress nuclear study with a large, severe, partially reversible inferior lateral defect consistent with infarct and mild peri-infarct ischemia.  LV Wall Motion:  Akinesis of the inferior lateral wall.   Kirk Ruths, MD  05/29/2014 11:34 AM

## 2014-06-19 ENCOUNTER — Encounter (INDEPENDENT_AMBULATORY_CARE_PROVIDER_SITE_OTHER): Payer: Medicare Other

## 2014-06-19 ENCOUNTER — Encounter: Payer: Self-pay | Admitting: *Deleted

## 2014-06-19 ENCOUNTER — Ambulatory Visit (INDEPENDENT_AMBULATORY_CARE_PROVIDER_SITE_OTHER): Payer: Medicare Other | Admitting: Cardiology

## 2014-06-19 ENCOUNTER — Encounter: Payer: Self-pay | Admitting: Cardiology

## 2014-06-19 VITALS — BP 134/74 | HR 107 | Ht 70.0 in | Wt 274.0 lb

## 2014-06-19 DIAGNOSIS — I4892 Unspecified atrial flutter: Secondary | ICD-10-CM

## 2014-06-19 DIAGNOSIS — R0602 Shortness of breath: Secondary | ICD-10-CM

## 2014-06-19 DIAGNOSIS — I4891 Unspecified atrial fibrillation: Secondary | ICD-10-CM

## 2014-06-19 DIAGNOSIS — I2510 Atherosclerotic heart disease of native coronary artery without angina pectoris: Secondary | ICD-10-CM

## 2014-06-19 DIAGNOSIS — I2583 Coronary atherosclerosis due to lipid rich plaque: Principal | ICD-10-CM

## 2014-06-19 LAB — CBC
HEMATOCRIT: 38.6 % — AB (ref 39.0–52.0)
Hemoglobin: 12.2 g/dL — ABNORMAL LOW (ref 13.0–17.0)
MCH: 27.2 pg (ref 26.0–34.0)
MCHC: 31.6 g/dL (ref 30.0–36.0)
MCV: 86 fL (ref 78.0–100.0)
MPV: 11 fL (ref 8.6–12.4)
PLATELETS: 228 10*3/uL (ref 150–400)
RBC: 4.49 MIL/uL (ref 4.22–5.81)
RDW: 16.4 % — AB (ref 11.5–15.5)
WBC: 7.5 10*3/uL (ref 4.0–10.5)

## 2014-06-19 LAB — TSH: TSH: 1.28 u[IU]/mL (ref 0.350–4.500)

## 2014-06-19 NOTE — Patient Instructions (Signed)
Your physician recommends that you schedule a follow-up appointment in: one year with Dr. Percival Spanish  We want you to wear a 24 hr monitor

## 2014-06-19 NOTE — Progress Notes (Signed)
Patient ID: Jesse Wheeler, male   DOB: 1940-10-04, 74 y.o.   MRN: [id number] [facility] 24 hour holter monitor applied to patient.

## 2014-06-19 NOTE — Progress Notes (Signed)
HPI The patient presents for followup of coronary disease.   When I last saw him he had some dyspnea.  I did send him for a stress perfusion study which demonstrated inferior infarct with an EF of 40%. This was not changed from previous. He still does have some dyspnea. He does exercise routinely at the [facility].   With this he denies any chest pressure, neck or arm discomfort. He has no  PND or orthopnea. He has no palpitations, presyncope or syncope.  He has not had the symptoms he had with his previous coronary disease.  He does have sinus tachycardia.   He is not noticing his heart rate. He does have mild DOE and decreased exercise tolerance.  He has gained weight.  He is somewhat limited by back pain.  No Known Allergies  Current Outpatient Prescriptions  Medication Sig Dispense Refill  . aspirin 81 MG tablet Take 81 mg by mouth daily.      . Blood Glucose Monitoring Suppl (ONE TOUCH ULTRA SYSTEM KIT) W/DEVICE KIT 1 kit by Does not apply route once. Take as Directed     . clotrimazole-betamethasone (LOTRISONE) cream   0  . Cyanocobalamin (VITAMIN B-12 PO) Take 1 tablet by mouth daily.      Marland Kitchen doxazosin (CARDURA) 8 MG tablet Take 8 mg by mouth daily.    . ergocalciferol (VITAMIN D2) 50000 UNITS capsule Take 50,000 Units by mouth once a week.    . ezetimibe (ZETIA) 10 MG tablet Take 10 mg by mouth daily.      . insulin glargine (LANTUS) 100 UNIT/ML injection Inject 75 Units into the skin daily.     . IRON PO Take 325 mg by mouth daily.      . isosorbide mononitrate (IMDUR) 30 MG 24 hr tablet TAKE 2 TABLETS PO DAILY 60 tablet 9  . Liraglutide (VICTOZA [place]) Inject into the skin.    Marland Kitchen lisinopril (PRINIVIL,ZESTRIL) 20 MG tablet Take 1 tablet (20 mg total) by mouth daily. 30 tablet 6  . meloxicam (MOBIC) 15 MG tablet Take 15 mg by mouth daily.    . metFORMIN (GLUCOPHAGE) 850 MG tablet Take 850 mg by mouth 3 (three) times daily.      . nitroGLYCERIN (NITROSTAT) 0.4 MG SL tablet Place 1 tablet (0.4 mg  total) under the tongue every 5 (five) minutes as needed. 25 tablet 1  . simvastatin (ZOCOR) 40 MG tablet Take 40 mg by mouth daily.     No current facility-administered medications for this visit.    Past Medical History  Diagnosis Date  . Coronary artery disease   . Acute inferior myocardial infarction     stenting in 2002 to the RCA  . S/P coronary angioplasty   . Hypertension     LCX  . Hyperlipidemia   . Diabetes mellitus     type II  . Hypercholesterolemia   . Right bundle branch block   . Obesity   . Prostatitis, chronic   . Peyronie's disease   . Tinnitus   . Panic disorder   . Dupuytren's contracture of left hand   . Onychomycosis   . Bowen's disease   . History of colonic polyps 04/14/2014    Past Surgical History  Procedure Laterality Date  . Coronary angioplasty  01/02/01    Acute inferior MI -- Stent placement in the proximal RCA (three separate stents totalling approximately 55 mm of stent with residual thrombus in the posterior descending artery) --  Relatively mild  inferior basilar hypokinesia --Severe stenosis in a secondary marginal vessel of the left circumflex -- Minimal coronary atherosclerosis otherwise of the coronary system   . Nuclear stress test  Oct 2011     Dense inferior infarct; mild ischemia with dilated LV. EF was 39%. Study was felt to be unchanged and he has been managed medically  . Cholecystectomy    . Colonoscopy  03/09/2004    Normal    ROS:  As stated in the HPI and negative for all other systems.   PHYSICAL EXAM BP 134/74 mmHg  Pulse 107  Ht '5\' 10"'  (1.778 m)  Wt 274 lb (124.286 kg)  BMI 39.32 kg/m2 GENERAL:  Well appearing NECK:  No jugular venous distention, waveform within normal limits, carotid upstroke brisk and symmetric, no bruits, no thyromegaly LUNGS:  Clear to auscultation bilaterally BACK:  No CVA tenderness CHEST:  Unremarkable HEART:  PMI not displaced or sustained,S1 and S2 within normal limits, no S3, no S4,  no clicks, no rubs, no murmurs ABD:  Flat, positive bowel sounds normal in frequency in pitch, no bruits, no rebound, no guarding, no midline pulsatile mass, no hepatomegaly, no splenomegaly, obese EXT:  2 plus pulses throughout, no edema, no cyanosis no clubbing SKIN:  No rashes no nodules  EKG:  Sinus rhythm, rate 107 , right bundle branch block, old inferior infarct, premature ventricular contraction, no acute ST-T wave changes. 06/19/2014  ASSESSMENT AND PLAN  CAD:  He had a moderate risk study but unchanged from previous. I don't think ischemia is the cause of any of his symptoms. No further cardiovascular testing is suggested.  TACHYCARDIA:   He has had this for some time.  It appears to be sinus tach.  I will apply a 24 hour Holter  OBESITY:  The patient understands the need to lose weight with diet and exercise. We have discussed specific strategies for this.  DYSPNEA:  I will check a BNP level.  FATIGUE:  We will bring back some labs that he had done recently. If he has not had a TSH her CBC I will order this.

## 2014-06-25 ENCOUNTER — Telehealth: Payer: Self-pay | Admitting: Cardiology

## 2014-06-25 NOTE — Telephone Encounter (Signed)
Pt would like his lab results from 06-19-14 please.

## 2014-06-25 NOTE — Telephone Encounter (Signed)
Pt informed of his blood work

## 2014-10-29 ENCOUNTER — Telehealth: Payer: Self-pay | Admitting: *Deleted

## 2014-10-29 NOTE — Telephone Encounter (Signed)
The patient is at acceptable risk for the planned procedure.   

## 2014-10-29 NOTE — Telephone Encounter (Signed)
Patient needs clearance for CATARACT EXTRACTION WITH INTRAOCULAR LENS IMPLANTATION OF THE RIGHT EYE FOLLOWED BY THE LEFT EYE. Procedure is scheduled for Friday this week 10-31-14

## 2014-10-29 NOTE — Telephone Encounter (Signed)
Note faxed to the number provided 

## 2014-12-14 ENCOUNTER — Inpatient Hospital Stay (HOSPITAL_COMMUNITY): Payer: Medicare Other

## 2014-12-14 ENCOUNTER — Encounter (HOSPITAL_COMMUNITY): Payer: Self-pay | Admitting: Emergency Medicine

## 2014-12-14 ENCOUNTER — Emergency Department (HOSPITAL_COMMUNITY): Payer: Medicare Other

## 2014-12-14 ENCOUNTER — Inpatient Hospital Stay (HOSPITAL_COMMUNITY)
Admission: EM | Admit: 2014-12-14 | Discharge: 2014-12-16 | DRG: 069 | Disposition: A | Discharge status: Home / Self Care | Payer: Medicare Other | Attending: Internal Medicine | Admitting: Internal Medicine

## 2014-12-14 DIAGNOSIS — N400 Enlarged prostate without lower urinary tract symptoms: Secondary | ICD-10-CM | POA: Diagnosis present

## 2014-12-14 DIAGNOSIS — I2510 Atherosclerotic heart disease of native coronary artery without angina pectoris: Secondary | ICD-10-CM | POA: Diagnosis present

## 2014-12-14 DIAGNOSIS — I252 Old myocardial infarction: Secondary | ICD-10-CM | POA: Diagnosis not present

## 2014-12-14 DIAGNOSIS — E669 Obesity, unspecified: Secondary | ICD-10-CM

## 2014-12-14 DIAGNOSIS — Z79899 Other long term (current) drug therapy: Secondary | ICD-10-CM | POA: Diagnosis not present

## 2014-12-14 DIAGNOSIS — G459 Transient cerebral ischemic attack, unspecified: Principal | ICD-10-CM | POA: Diagnosis present

## 2014-12-14 DIAGNOSIS — N419 Inflammatory disease of prostate, unspecified: Secondary | ICD-10-CM | POA: Diagnosis present

## 2014-12-14 DIAGNOSIS — E785 Hyperlipidemia, unspecified: Secondary | ICD-10-CM | POA: Diagnosis present

## 2014-12-14 DIAGNOSIS — Z87891 Personal history of nicotine dependence: Secondary | ICD-10-CM | POA: Diagnosis not present

## 2014-12-14 DIAGNOSIS — I10 Essential (primary) hypertension: Secondary | ICD-10-CM | POA: Diagnosis present

## 2014-12-14 DIAGNOSIS — Z794 Long term (current) use of insulin: Secondary | ICD-10-CM

## 2014-12-14 DIAGNOSIS — F410 Panic disorder [episodic paroxysmal anxiety] without agoraphobia: Secondary | ICD-10-CM | POA: Diagnosis present

## 2014-12-14 DIAGNOSIS — E1165 Type 2 diabetes mellitus with hyperglycemia: Secondary | ICD-10-CM | POA: Diagnosis present

## 2014-12-14 DIAGNOSIS — G451 Carotid artery syndrome (hemispheric): Secondary | ICD-10-CM | POA: Diagnosis not present

## 2014-12-14 DIAGNOSIS — I6789 Other cerebrovascular disease: Secondary | ICD-10-CM | POA: Diagnosis not present

## 2014-12-14 DIAGNOSIS — Z8249 Family history of ischemic heart disease and other diseases of the circulatory system: Secondary | ICD-10-CM | POA: Diagnosis not present

## 2014-12-14 DIAGNOSIS — R1310 Dysphagia, unspecified: Secondary | ICD-10-CM | POA: Diagnosis present

## 2014-12-14 DIAGNOSIS — Z955 Presence of coronary angioplasty implant and graft: Secondary | ICD-10-CM

## 2014-12-14 DIAGNOSIS — Z6841 Body Mass Index (BMI) 40.0 and over, adult: Secondary | ICD-10-CM

## 2014-12-14 DIAGNOSIS — I639 Cerebral infarction, unspecified: Secondary | ICD-10-CM | POA: Diagnosis not present

## 2014-12-14 DIAGNOSIS — Z7982 Long term (current) use of aspirin: Secondary | ICD-10-CM

## 2014-12-14 DIAGNOSIS — M720 Palmar fascial fibromatosis [Dupuytren]: Secondary | ICD-10-CM | POA: Diagnosis present

## 2014-12-14 DIAGNOSIS — E1129 Type 2 diabetes mellitus with other diabetic kidney complication: Secondary | ICD-10-CM

## 2014-12-14 DIAGNOSIS — IMO0002 Reserved for concepts with insufficient information to code with codable children: Secondary | ICD-10-CM | POA: Diagnosis present

## 2014-12-14 DIAGNOSIS — I6522 Occlusion and stenosis of left carotid artery: Secondary | ICD-10-CM | POA: Diagnosis not present

## 2014-12-14 HISTORY — DX: Tinea unguium: B35.1

## 2014-12-14 HISTORY — DX: Actinic keratosis: L57.0

## 2014-12-14 LAB — URINALYSIS, ROUTINE W REFLEX MICROSCOPIC
Bilirubin Urine: NEGATIVE
Glucose, UA: NEGATIVE mg/dL
Hgb urine dipstick: NEGATIVE
KETONES UR: NEGATIVE mg/dL
LEUKOCYTES UA: NEGATIVE
Nitrite: NEGATIVE
Protein, ur: NEGATIVE mg/dL
SPECIFIC GRAVITY, URINE: 1.012 (ref 1.005–1.030)
Urobilinogen, UA: 0.2 mg/dL (ref 0.0–1.0)
pH: 5 (ref 5.0–8.0)

## 2014-12-14 LAB — CBC WITH DIFFERENTIAL/PLATELET
BASOS ABS: 0 10*3/uL (ref 0.0–0.1)
Basophils Relative: 1 % (ref 0–1)
Eosinophils Absolute: 0.2 10*3/uL (ref 0.0–0.7)
Eosinophils Relative: 3 % (ref 0–5)
HEMATOCRIT: 35.4 % — AB (ref 39.0–52.0)
Hemoglobin: 11.1 g/dL — ABNORMAL LOW (ref 13.0–17.0)
LYMPHS PCT: 29 % (ref 12–46)
Lymphs Abs: 2 10*3/uL (ref 0.7–4.0)
MCH: 27.2 pg (ref 26.0–34.0)
MCHC: 31.4 g/dL (ref 30.0–36.0)
MCV: 86.8 fL (ref 78.0–100.0)
MONO ABS: 0.5 10*3/uL (ref 0.1–1.0)
Monocytes Relative: 8 % (ref 3–12)
NEUTROS PCT: 59 % (ref 43–77)
Neutro Abs: 4.1 10*3/uL (ref 1.7–7.7)
Platelets: 202 10*3/uL (ref 150–400)
RBC: 4.08 MIL/uL — ABNORMAL LOW (ref 4.22–5.81)
RDW: 15.5 % (ref 11.5–15.5)
WBC: 6.8 10*3/uL (ref 4.0–10.5)

## 2014-12-14 LAB — COMPREHENSIVE METABOLIC PANEL
ALBUMIN: 3.8 g/dL (ref 3.5–5.0)
ALT: 25 U/L (ref 17–63)
ANION GAP: 8 (ref 5–15)
AST: 34 U/L (ref 15–41)
Alkaline Phosphatase: 36 U/L — ABNORMAL LOW (ref 38–126)
BUN: 21 mg/dL — AB (ref 6–20)
CO2: 20 mmol/L — ABNORMAL LOW (ref 22–32)
CREATININE: 1.63 mg/dL — AB (ref 0.61–1.24)
Calcium: 8.7 mg/dL — ABNORMAL LOW (ref 8.9–10.3)
Chloride: 108 mmol/L (ref 101–111)
GFR calc Af Amer: 47 mL/min — ABNORMAL LOW (ref >60)
GFR calc non Af Amer: 40 mL/min — ABNORMAL LOW (ref >60)
Glucose, Bld: 166 mg/dL — ABNORMAL HIGH (ref 65–99)
Potassium: 4.6 mmol/L (ref 3.5–5.1)
Sodium: 136 mmol/L (ref 135–145)
TOTAL PROTEIN: 6.6 g/dL (ref 6.5–8.1)
Total Bilirubin: 0.3 mg/dL (ref 0.3–1.2)

## 2014-12-14 LAB — I-STAT TROPONIN, ED: Troponin i, poc: 0.07 ng/mL (ref 0.00–0.08)

## 2014-12-14 LAB — CBC
HCT: 34.7 % — ABNORMAL LOW (ref 39.0–52.0)
Hemoglobin: 11.1 g/dL — ABNORMAL LOW (ref 13.0–17.0)
MCH: 27.1 pg (ref 26.0–34.0)
MCHC: 32 g/dL (ref 30.0–36.0)
MCV: 84.8 fL (ref 78.0–100.0)
PLATELETS: 195 10*3/uL (ref 150–400)
RBC: 4.09 MIL/uL — AB (ref 4.22–5.81)
RDW: 15.4 % (ref 11.5–15.5)
WBC: 7.2 10*3/uL (ref 4.0–10.5)

## 2014-12-14 LAB — CBG MONITORING, ED
Glucose-Capillary: 157 mg/dL — ABNORMAL HIGH (ref 65–99)
Glucose-Capillary: 105 mg/dL — ABNORMAL HIGH (ref 65–99)

## 2014-12-14 LAB — CREATININE, SERUM
Creatinine, Ser: 1.65 mg/dL — ABNORMAL HIGH (ref 0.61–1.24)
GFR calc Af Amer: 46 mL/min — ABNORMAL LOW (ref >60)
GFR calc non Af Amer: 40 mL/min — ABNORMAL LOW (ref >60)

## 2014-12-14 MED ORDER — VITAMIN D (ERGOCALCIFEROL) 1.25 MG (50000 UNIT) PO CAPS
50000.0000 [IU] | ORAL_CAPSULE | ORAL | Status: DC
  Administered 2014-12-15: 50000 [IU] via ORAL
  Filled 2014-12-14: qty 1

## 2014-12-14 MED ORDER — STROKE: EARLY STAGES OF RECOVERY BOOK
Freq: Once | Status: AC
  Administered 2014-12-15

## 2014-12-14 MED ORDER — NITROGLYCERIN 0.4 MG SL SUBL: 0.4000 mg | SUBLINGUAL_TABLET | SUBLINGUAL | Status: DC | PRN

## 2014-12-14 MED ORDER — ASPIRIN 81 MG PO TABS: 325.0000 mg | ORAL_TABLET | Freq: Every day | ORAL | Status: DC

## 2014-12-14 MED ORDER — LIRAGLUTIDE 18 MG/3ML ~~LOC~~ SOPN
1.8000 mg | PEN_INJECTOR | Freq: Every day | SUBCUTANEOUS | Status: DC
  Filled 2014-12-14: qty 0.3

## 2014-12-14 MED ORDER — ENOXAPARIN SODIUM 40 MG/0.4ML ~~LOC~~ SOLN
40.0000 mg | SUBCUTANEOUS | Status: DC
  Administered 2014-12-15 (×2): 40 mg via SUBCUTANEOUS
  Filled 2014-12-14 (×2): qty 0.4

## 2014-12-14 MED ORDER — DOXAZOSIN MESYLATE 8 MG PO TABS
8.0000 mg | ORAL_TABLET | Freq: Every day | ORAL | Status: DC
  Administered 2014-12-15 – 2014-12-16 (×2): 8 mg via ORAL
  Filled 2014-12-14 (×2): qty 1

## 2014-12-14 MED ORDER — INSULIN ASPART 100 UNIT/ML ~~LOC~~ SOLN: 0.0000 [IU] | Freq: Every day | SUBCUTANEOUS | Status: DC

## 2014-12-14 MED ORDER — INSULIN ASPART 100 UNIT/ML ~~LOC~~ SOLN
0.0000 [IU] | Freq: Three times a day (TID) | SUBCUTANEOUS | Status: DC
  Administered 2014-12-15: 2 [IU] via SUBCUTANEOUS
  Administered 2014-12-15: 3 [IU] via SUBCUTANEOUS
  Administered 2014-12-16: 2 [IU] via SUBCUTANEOUS

## 2014-12-14 MED ORDER — LISINOPRIL 20 MG PO TABS
20.0000 mg | ORAL_TABLET | Freq: Every day | ORAL | Status: DC
  Administered 2014-12-15 – 2014-12-16 (×2): 20 mg via ORAL
  Filled 2014-12-14 (×2): qty 1

## 2014-12-14 MED ORDER — SIMVASTATIN 40 MG PO TABS
40.0000 mg | ORAL_TABLET | Freq: Every day | ORAL | Status: DC
  Administered 2014-12-15 – 2014-12-16 (×2): 40 mg via ORAL
  Filled 2014-12-14 (×2): qty 1

## 2014-12-14 MED ORDER — ERGOCALCIFEROL 1.25 MG (50000 UT) PO CAPS: 50000.0000 [IU] | ORAL_CAPSULE | ORAL | Status: DC

## 2014-12-14 MED ORDER — INSULIN GLARGINE 100 UNIT/ML ~~LOC~~ SOLN
75.0000 [IU] | Freq: Every day | SUBCUTANEOUS | Status: DC
  Administered 2014-12-15 (×2): 75 [IU] via SUBCUTANEOUS
  Filled 2014-12-14 (×3): qty 0.75

## 2014-12-14 MED ORDER — CLOTRIMAZOLE 1 % EX CREA
TOPICAL_CREAM | Freq: Two times a day (BID) | CUTANEOUS | Status: DC
  Administered 2014-12-15: 10:00:00 via TOPICAL
  Filled 2014-12-14: qty 15

## 2014-12-14 MED ORDER — ASPIRIN 325 MG PO TABS
325.0000 mg | ORAL_TABLET | Freq: Every day | ORAL | Status: DC
  Administered 2014-12-15 – 2014-12-16 (×2): 325 mg via ORAL
  Filled 2014-12-14 (×2): qty 1

## 2014-12-14 MED ORDER — ISOSORBIDE MONONITRATE ER 30 MG PO TB24
60.0000 mg | ORAL_TABLET | Freq: Every day | ORAL | Status: DC
  Administered 2014-12-15 – 2014-12-16 (×2): 60 mg via ORAL
  Filled 2014-12-14 (×2): qty 2

## 2014-12-14 MED ORDER — EZETIMIBE 10 MG PO TABS
10.0000 mg | ORAL_TABLET | Freq: Every day | ORAL | Status: DC
  Administered 2014-12-15 – 2014-12-16 (×2): 10 mg via ORAL
  Filled 2014-12-14 (×2): qty 1

## 2014-12-14 MED ORDER — FERROUS SULFATE 325 (65 FE) MG PO TABS
325.0000 mg | ORAL_TABLET | Freq: Every day | ORAL | Status: DC
  Administered 2014-12-15 – 2014-12-16 (×2): 325 mg via ORAL
  Filled 2014-12-14 (×2): qty 1

## 2014-12-14 MED ORDER — SENNOSIDES-DOCUSATE SODIUM 8.6-50 MG PO TABS: 1.0000 | ORAL_TABLET | Freq: Every evening | ORAL | Status: DC | PRN

## 2014-12-14 NOTE — ED Notes (Signed)
Pt c/o numbness to right perioral area, radiating down right neck, right arm numbness, slurred speech, difficulty swallowing onset 30 minutes ago. Pt denies any symptoms currently. Last normal was today 30 minutes ago.

## 2014-12-14 NOTE — Consult Note (Signed)
Admission H&P    Chief Complaint: Transient right-sided weakness and numbness as well as swallowing difficulty.  HPI: Jesse Wheeler is an 74 y.o. male history of diabetes mellitus, hypertension, hyperlipidemia, coronary artery disease and myocardial infarction, presenting with transient numbness and weakness involving his right upper and lower extremities as well as slurred speech and dysphagia. Symptoms lasted about 15-20 minutes then resolved. Onset was at 3 PM today. There's no previous history of stroke nor TIA. He's been taking aspirin 81 mg per day. CT scan of his head showed an area of hypodensity involving the right parietal region, suspicious for acute ischemic stroke. NIH stroke score at the time of this evaluation was 0.  LSN: 3 PM on 12/14/2014 tPA Given: No: Deficits resolved mRankin:  Past Medical History  Diagnosis Date  . Coronary artery disease   . Acute inferior myocardial infarction     stenting in 2002 to the RCA  . S/P coronary angioplasty   . Hypertension     LCX  . Hyperlipidemia   . Diabetes mellitus     type II  . Hypercholesterolemia   . Right bundle branch block   . Obesity   . Prostatitis, chronic   . Peyronie's disease   . Tinnitus   . Panic disorder   . Dupuytren's contracture of left hand   . Onychomycosis   . Bowen's disease   . History of colonic polyps 04/14/2014  . Actinic keratoses 08/09/2013    Overview:  IMPRESSION: Liquid nitrogen x2   . ONYCHOMYCOSIS 09/07/2006    Qualifier: Diagnosis of  By: Cletus Gash MD, Cassandria Santee      Past Surgical History  Procedure Laterality Date  . Coronary angioplasty  01/02/01    Acute inferior MI -- Stent placement in the proximal RCA (three separate stents totalling approximately 55 mm of stent with residual thrombus in the posterior descending artery) --  Relatively mild inferior basilar hypokinesia --Severe stenosis in a secondary marginal vessel of the left circumflex -- Minimal coronary atherosclerosis otherwise  of the coronary system   . Nuclear stress test  Oct 2011     Dense inferior infarct; mild ischemia with dilated LV. EF was 39%. Study was felt to be unchanged and he has been managed medically  . Cholecystectomy    . Colonoscopy  03/09/2004    Normal  . Cataract extraction w/ intraocular lens  implant, bilateral      Family History  Problem Relation Age of Onset  . Heart attack Father   . Heart disease Father   . Diabetes Sister   . Colon cancer Neg Hx   . Transient ischemic attack Sister    Social History:  reports that he quit smoking about 33 years ago. His smoking use included Cigarettes. He has never used smokeless tobacco. He reports that he drinks alcohol. He reports that he does not use illicit drugs.  Allergies: No Known Allergies  Medications Prior to Admission  Medication Sig Dispense Refill  . aspirin 81 MG tablet Take 81 mg by mouth daily.      . Blood Glucose Monitoring Suppl (ONE TOUCH ULTRA SYSTEM KIT) W/DEVICE KIT 1 kit by Does not apply route once. Take as Directed     . clotrimazole-betamethasone (LOTRISONE) cream Apply 1 application topically daily as needed (itchy skin).   0  . Cyanocobalamin (VITAMIN B-12 PO) Take 1 tablet by mouth daily.      Marland Kitchen doxazosin (CARDURA) 8 MG tablet Take 8 mg by mouth daily.    Marland Kitchen  ergocalciferol (VITAMIN D2) 50000 UNITS capsule Take 50,000 Units by mouth once a week.    . ezetimibe (ZETIA) 10 MG tablet Take 10 mg by mouth daily.      . ferrous sulfate 325 (65 FE) MG tablet Take 325 mg by mouth daily with breakfast.    . insulin glargine (LANTUS) 100 UNIT/ML injection Inject 75 Units into the skin at bedtime.     . isosorbide mononitrate (IMDUR) 30 MG 24 hr tablet TAKE 2 TABLETS PO DAILY (Patient taking differently: Take 60 mg by mouth daily. ) 60 tablet 9  . Liraglutide (VICTOZA [place]) Inject 1.8 mg into the skin daily.     Marland Kitchen lisinopril (PRINIVIL,ZESTRIL) 20 MG tablet Take 1 tablet (20 mg total) by mouth daily. 30 tablet 6  . metFORMIN  (GLUCOPHAGE) 850 MG tablet Take 850 mg by mouth 3 (three) times daily.      . simvastatin (ZOCOR) 40 MG tablet Take 40 mg by mouth daily.    . nitroGLYCERIN (NITROSTAT) 0.4 MG SL tablet Place 1 tablet (0.4 mg total) under the tongue every 5 (five) minutes as needed. (Patient not taking: Reported on 12/14/2014) 25 tablet 1    ROS: Constitutional: Negative for fever, chills, weight loss and malaise/fatigue.  HENT: Negative.  Eyes: Negative.  Respiratory: Negative for cough, sputum production and shortness of breath.  Cardiovascular: Negative for chest pain and palpitations.  Gastrointestinal: Positive for heartburn and diarrhea. Negative for nausea, vomiting, abdominal pain, constipation, blood in stool and melena.  Genitourinary: Negative.  Musculoskeletal: Positive for myalgias and joint pain.  Skin: Positive for itching.  Neurological: Positive for tingling, sensory change, speech change and focal weakness. Negative for dizziness, seizures, loss of consciousness and weakness.  Endo/Heme/Allergies: Negative for environmental allergies and polydipsia. Does not bruise/bleed easily.  Psychiatric/Behavioral: Negative.   Physical Examination: Blood pressure 145/91, pulse 83, temperature 98.4 F (36.9 C), temperature source Oral, resp. rate 16, height '5\' 9"'  (1.753 m), weight 123.061 kg (271 lb 4.8 oz), SpO2 96 %.  HEENT-  Normocephalic, no lesions, without obvious abnormality.  Normal external eye and conjunctiva.  Normal TM's bilaterally.  Normal auditory canals and external ears. Normal external nose, mucus membranes and septum.  Normal pharynx. Neck supple with no masses, nodes, nodules or enlargement. Cardiovascular - regularly irregular rhythm, S1, S2 normal and no S3 or S4 Lungs - chest clear, no wheezing, rales, normal symmetric air entry Abdomen - soft, non-tender; bowel sounds normal; no masses,  no organomegaly Extremities - no joint deformities, effusion, or inflammation, no  edema and no skin discoloration  Neurologic Examination: Mental Status: Alert, oriented, thought content appropriate.  Speech fluent without evidence of aphasia. Able to follow commands without difficulty. Cranial Nerves: II-Visual fields were normal. III/IV/VI-Pupils were equal and reacted normally to light. Extraocular movements were full and conjugate.    V/VII-no facial numbness and no facial weakness. VIII-normal. X-normal speech and symmetrical palatal movement. XI: trapezius strength/neck flexion strength normal bilaterally XII-midline tongue extension with normal strength. Motor: 5/5 bilaterally with normal tone and bulk Sensory: Normal throughout. Deep Tendon Reflexes: 2+ and symmetric. Plantars: Flexor bilaterally Cerebellar: Normal finger-to-nose testing. Carotid auscultation: Normal  Results for orders placed or performed during the hospital encounter of 12/14/14 (from the past 48 hour(s))  POC CBG, ED     Status: Abnormal   Collection Time: 12/14/14  4:36 PM  Result Value Ref Range   Glucose-Capillary 157 (H) 65 - 99 mg/dL  CBC with Differential     Status:  Abnormal   Collection Time: 12/14/14  4:37 PM  Result Value Ref Range   WBC 6.8 4.0 - 10.5 K/uL   RBC 4.08 (L) 4.22 - 5.81 MIL/uL   Hemoglobin 11.1 (L) 13.0 - 17.0 g/dL   HCT 35.4 (L) 39.0 - 52.0 %   MCV 86.8 78.0 - 100.0 fL   MCH 27.2 26.0 - 34.0 pg   MCHC 31.4 30.0 - 36.0 g/dL   RDW 15.5 11.5 - 15.5 %   Platelets 202 150 - 400 K/uL   Neutrophils Relative % 59 43 - 77 %   Neutro Abs 4.1 1.7 - 7.7 K/uL   Lymphocytes Relative 29 12 - 46 %   Lymphs Abs 2.0 0.7 - 4.0 K/uL   Monocytes Relative 8 3 - 12 %   Monocytes Absolute 0.5 0.1 - 1.0 K/uL   Eosinophils Relative 3 0 - 5 %   Eosinophils Absolute 0.2 0.0 - 0.7 K/uL   Basophils Relative 1 0 - 1 %   Basophils Absolute 0.0 0.0 - 0.1 K/uL  Comprehensive metabolic panel     Status: Abnormal   Collection Time: 12/14/14  4:37 PM  Result Value Ref Range    Sodium 136 135 - 145 mmol/L   Potassium 4.6 3.5 - 5.1 mmol/L   Chloride 108 101 - 111 mmol/L   CO2 20 (L) 22 - 32 mmol/L   Glucose, Bld 166 (H) 65 - 99 mg/dL   BUN 21 (H) 6 - 20 mg/dL   Creatinine, Ser 1.63 (H) 0.61 - 1.24 mg/dL   Calcium 8.7 (L) 8.9 - 10.3 mg/dL   Total Protein 6.6 6.5 - 8.1 g/dL   Albumin 3.8 3.5 - 5.0 g/dL   AST 34 15 - 41 U/L   ALT 25 17 - 63 U/L   Alkaline Phosphatase 36 (L) 38 - 126 U/L   Total Bilirubin 0.3 0.3 - 1.2 mg/dL   GFR calc non Af Amer 40 (L) >60 mL/min   GFR calc Af Amer 47 (L) >60 mL/min    Comment: (NOTE) The eGFR has been calculated using the CKD EPI equation. This calculation has not been validated in all clinical situations. eGFR's persistently <60 mL/min signify possible Chronic Kidney Disease.    Anion gap 8 5 - 15  I-stat troponin, ED     Status: None   Collection Time: 12/14/14  4:44 PM  Result Value Ref Range   Troponin i, poc 0.07 0.00 - 0.08 ng/mL   Comment 3            Comment: Due to the release kinetics of cTnI, a negative result within the first hours of the onset of symptoms does not rule out myocardial infarction with certainty. If myocardial infarction is still suspected, repeat the test at appropriate intervals.   Urinalysis, Routine w reflex microscopic (not at [facility])     Status: None   Collection Time: 12/14/14  4:50 PM  Result Value Ref Range   Color, Urine YELLOW YELLOW   APPearance CLEAR CLEAR   Specific Gravity, Urine 1.012 1.005 - 1.030   pH 5.0 5.0 - 8.0   Glucose, UA NEGATIVE NEGATIVE mg/dL   Hgb urine dipstick NEGATIVE NEGATIVE   Bilirubin Urine NEGATIVE NEGATIVE   Ketones, ur NEGATIVE NEGATIVE mg/dL   Protein, ur NEGATIVE NEGATIVE mg/dL   Urobilinogen, UA 0.2 0.0 - 1.0 mg/dL   Nitrite NEGATIVE NEGATIVE   Leukocytes, UA NEGATIVE NEGATIVE    Comment: MICROSCOPIC NOT DONE ON URINES WITH NEGATIVE  PROTEIN, BLOOD, LEUKOCYTES, NITRITE, OR GLUCOSE <1000 mg/dL.  CBG monitoring, ED     Status: Abnormal    Collection Time: 12/14/14  9:22 PM  Result Value Ref Range   Glucose-Capillary 105 (H) 65 - 99 mg/dL   Comment 1 Notify RN    Ct Head Wo Contrast  12/14/2014   CLINICAL DATA:  Stroke-like symptoms, numbness to right perioral area, right arm numbness, slurred speech  EXAM: CT HEAD WITHOUT CONTRAST  TECHNIQUE: Contiguous axial images were obtained from the base of the skull through the vertex without intravenous contrast.  COMPARISON:  None.  FINDINGS: No evidence of parenchymal hemorrhage or extra-axial fluid collection. No mass lesion, mass effect, or midline shift.  Hypodensity in the right parietal region (series 2/ image 26), worrisome for acute/subacute infarct.  Subcortical white matter and periventricular small vessel ischemic changes. Intracranial atherosclerosis.  Cerebral volume is within normal limits.  No ventriculomegaly.  The visualized paranasal sinuses are essentially clear. The mastoid air cells are unopacified.  No evidence of calvarial fracture.  IMPRESSION: Hypodensity in the right parietal region, worrisome for acute/subacute infarct.  These results were called by telephone at the time of interpretation on 12/14/2014 at 4:52 pm to Dr. Deno Etienne , who verbally acknowledged these results.   Electronically Signed   By: Julian Hy M.D.   On: 12/14/2014 16:53    Assessment: 74 y.o. male multiple risk factors for stroke presenting with transient weakness and numbness of right extremities as well as slurred speech and dysphagia, likely manifestations of left subcortical TIA or possible ischemic stroke. Significance of right parietal area of hypodensity is unclear.  Stroke Risk Factors - diabetes mellitus, family history, hyperlipidemia and hypertension  Plan: 1. HgbA1c, fasting lipid panel 2. MRI, MRA  of the brain without contrast 3. PT consult, OT consult, Speech consult 4. Echocardiogram 5. Carotid dopplers 6. Prophylactic therapy-Antiplatelet med: Aspirin  7. Risk factor  modification 8. Telemetry monitoring  C.R. Nicole Kindred, MD [facility] [phone]  12/14/2014, 10:40 PM

## 2014-12-14 NOTE — ED Provider Notes (Signed)
CSN: [id number]     Arrival date & time 12/14/14  1543 History   First MD Initiated Contact with Patient 12/14/14 1554     Chief Complaint  Patient presents with  . Numbness     (Consider location/radiation/quality/duration/timing/severity/associated sxs/prior Treatment) Patient is a 74 y.o. male presenting with Acute Neurological Problem. The history is provided by the patient.  Cerebrovascular Accident This is a new problem. The current episode started less than 1 hour ago. The problem occurs constantly. The problem has been resolved. Pertinent negatives include no chest pain, no abdominal pain, no headaches and no shortness of breath. Nothing aggravates the symptoms. Nothing relieves the symptoms. He has tried nothing for the symptoms. The treatment provided no relief.   74 yo M with a chief complaint of coronary artery disease hypertension diabetes comes in with a chief complaint of slurred speech and perioral numbness, as well as right arm numbness. Patient states this started about 30 minutes ago resolved prior to arrival. Denies headache chest pain abdominal pain shortness breath. No prior history of stroke. Patient denies any extremity weakness denies any loss of coordination.  Past Medical History  Diagnosis Date  . Coronary artery disease   . Acute inferior myocardial infarction     stenting in 2002 to the RCA  . S/P coronary angioplasty   . Hypertension     LCX  . Hyperlipidemia   . Diabetes mellitus     type II  . Hypercholesterolemia   . Right bundle branch block   . Obesity   . Prostatitis, chronic   . Peyronie's disease   . Tinnitus   . Panic disorder   . Dupuytren's contracture of left hand   . Onychomycosis   . Bowen's disease   . History of colonic polyps 04/14/2014  . Actinic keratoses 08/09/2013    Overview:  IMPRESSION: Liquid nitrogen x2   . ONYCHOMYCOSIS 09/07/2006    Qualifier: Diagnosis of  By: Cletus Gash MD, Cassandria Santee     Past Surgical History  Procedure  Laterality Date  . Coronary angioplasty  01/02/01    Acute inferior MI -- Stent placement in the proximal RCA (three separate stents totalling approximately 55 mm of stent with residual thrombus in the posterior descending artery) --  Relatively mild inferior basilar hypokinesia --Severe stenosis in a secondary marginal vessel of the left circumflex -- Minimal coronary atherosclerosis otherwise of the coronary system   . Nuclear stress test  Oct 2011     Dense inferior infarct; mild ischemia with dilated LV. EF was 39%. Study was felt to be unchanged and he has been managed medically  . Cholecystectomy    . Colonoscopy  03/09/2004    Normal  . Cataract extraction w/ intraocular lens  implant, bilateral     Family History  Problem Relation Age of Onset  . Heart attack Father   . Heart disease Father   . Diabetes Sister   . Colon cancer Neg Hx   . Transient ischemic attack Sister    History  Substance Use Topics  . Smoking status: Former Smoker    Types: Cigarettes    Quit date: 11/17/1981  . Smokeless tobacco: Never Used  . Alcohol Use: 0.0 oz/week    0 Standard drinks or equivalent per week     Comment: occ.    Review of Systems  Constitutional: Negative for fever and chills.  HENT: Negative for congestion and facial swelling.   Eyes: Negative for discharge and visual disturbance.  Respiratory:  Negative for shortness of breath.   Cardiovascular: Negative for chest pain and palpitations.  Gastrointestinal: Negative for vomiting, abdominal pain and diarrhea.  Musculoskeletal: Negative for myalgias and arthralgias.  Skin: Negative for color change and rash.  Neurological: Positive for speech difficulty (slurred). Negative for tremors, syncope and headaches.  Psychiatric/Behavioral: Negative for confusion and dysphoric mood.      Allergies  Review of patient's allergies indicates no known allergies.  Home Medications   Prior to Admission medications   Medication Sig  Start Date End Date Taking? Authorizing Provider  aspirin 81 MG tablet Take 81 mg by mouth daily.     Yes Historical Provider, MD  Blood Glucose Monitoring Suppl (ONE TOUCH ULTRA SYSTEM KIT) W/DEVICE KIT 1 kit by Does not apply route once. Take as Directed    Yes Historical Provider, MD  clotrimazole-betamethasone (LOTRISONE) cream Apply 1 application topically daily as needed (itchy skin).  04/14/14  Yes Historical Provider, MD  Cyanocobalamin (VITAMIN B-12 PO) Take 1 tablet by mouth daily.     Yes Historical Provider, MD  doxazosin (CARDURA) 8 MG tablet Take 8 mg by mouth daily.   Yes Historical Provider, MD  ergocalciferol (VITAMIN D2) 50000 UNITS capsule Take 50,000 Units by mouth once a week.   Yes Historical Provider, MD  ezetimibe (ZETIA) 10 MG tablet Take 10 mg by mouth daily.     Yes Historical Provider, MD  ferrous sulfate 325 (65 FE) MG tablet Take 325 mg by mouth daily with breakfast.   Yes Historical Provider, MD  insulin glargine (LANTUS) 100 UNIT/ML injection Inject 75 Units into the skin at bedtime.    Yes Historical Provider, MD  isosorbide mononitrate (IMDUR) 30 MG 24 hr tablet TAKE 2 TABLETS PO DAILY Patient taking differently: Take 60 mg by mouth daily.  05/31/13  Yes Minus Breeding, MD  Liraglutide (VICTOZA [place]) Inject 1.8 mg into the skin daily.    Yes Historical Provider, MD  lisinopril (PRINIVIL,ZESTRIL) 20 MG tablet Take 1 tablet (20 mg total) by mouth daily. 01/27/12  Yes Burtis Junes, NP  metFORMIN (GLUCOPHAGE) 850 MG tablet Take 850 mg by mouth 3 (three) times daily.     Yes Historical Provider, MD  simvastatin (ZOCOR) 40 MG tablet Take 40 mg by mouth daily.   Yes Historical Provider, MD  nitroGLYCERIN (NITROSTAT) 0.4 MG SL tablet Place 1 tablet (0.4 mg total) under the tongue every 5 (five) minutes as needed. Patient not taking: Reported on 12/14/2014 10/15/12   Minus Breeding, MD   BP 145/91 mmHg  Pulse 83  Temp(Src) 98.4 F (36.9 C) (Oral)  Resp 16  Ht '5\' 9"'  (1.753  m)  Wt 271 lb 4.8 oz (123.061 kg)  BMI 40.05 kg/m2  SpO2 96% Physical Exam  Constitutional: He is oriented to person, place, and time. He appears well-developed and well-nourished.  HENT:  Head: Normocephalic and atraumatic.  Eyes: EOM are normal. Pupils are equal, round, and reactive to light.  Neck: Normal range of motion. Neck supple. No JVD present.  Cardiovascular: Normal rate and regular rhythm.  Exam reveals no gallop and no friction rub.   No murmur heard. Pulmonary/Chest: No respiratory distress. He has no wheezes.  Abdominal: He exhibits no distension. There is no rebound and no guarding.  Musculoskeletal: Normal range of motion.  Neurological: He is alert and oriented to person, place, and time. He has normal strength. No cranial nerve deficit or sensory deficit. He displays a negative Romberg sign. Coordination and gait normal.  GCS eye subscore is 4. GCS verbal subscore is 5. GCS motor subscore is 6. He displays no Babinski's sign on the right side. He displays no Babinski's sign on the left side.  Reflex Scores:      Tricep reflexes are 2+ on the right side and 2+ on the left side.      Bicep reflexes are 2+ on the right side and 2+ on the left side.      Brachioradialis reflexes are 2+ on the right side and 2+ on the left side.      Patellar reflexes are 2+ on the right side and 2+ on the left side.      Achilles reflexes are 2+ on the right side and 2+ on the left side. Skin: No rash noted. No pallor.  Psychiatric: He has a normal mood and affect. His behavior is normal.    ED Course  Procedures (including critical care time) Labs Review Labs Reviewed  CBC WITH DIFFERENTIAL/PLATELET - Abnormal; Notable for the following:    RBC 4.08 (*)    Hemoglobin 11.1 (*)    HCT 35.4 (*)    All other components within normal limits  COMPREHENSIVE METABOLIC PANEL - Abnormal; Notable for the following:    CO2 20 (*)    Glucose, Bld 166 (*)    BUN 21 (*)    Creatinine, Ser 1.63  (*)    Calcium 8.7 (*)    Alkaline Phosphatase 36 (*)    GFR calc non Af Amer 40 (*)    GFR calc Af Amer 47 (*)    All other components within normal limits  CBG MONITORING, ED - Abnormal; Notable for the following:    Glucose-Capillary 157 (*)    All other components within normal limits  CBG MONITORING, ED - Abnormal; Notable for the following:    Glucose-Capillary 105 (*)    All other components within normal limits  URINALYSIS, ROUTINE W REFLEX MICROSCOPIC (NOT AT [facility])  HEMOGLOBIN A1C  LIPID PANEL  CBC  CREATININE, SERUM  I-STAT TROPOININ, ED    Imaging Review Ct Head Wo Contrast  12/14/2014   CLINICAL DATA:  Stroke-like symptoms, numbness to right perioral area, right arm numbness, slurred speech  EXAM: CT HEAD WITHOUT CONTRAST  TECHNIQUE: Contiguous axial images were obtained from the base of the skull through the vertex without intravenous contrast.  COMPARISON:  None.  FINDINGS: No evidence of parenchymal hemorrhage or extra-axial fluid collection. No mass lesion, mass effect, or midline shift.  Hypodensity in the right parietal region (series 2/ image 26), worrisome for acute/subacute infarct.  Subcortical white matter and periventricular small vessel ischemic changes. Intracranial atherosclerosis.  Cerebral volume is within normal limits.  No ventriculomegaly.  The visualized paranasal sinuses are essentially clear. The mastoid air cells are unopacified.  No evidence of calvarial fracture.  IMPRESSION: Hypodensity in the right parietal region, worrisome for acute/subacute infarct.  These results were called by telephone at the time of interpretation on 12/14/2014 at 4:52 pm to Dr. Deno Etienne , who verbally acknowledged these results.   Electronically Signed   By: Julian Hy M.D.   On: 12/14/2014 16:53     EKG Interpretation   Date/Time:  Sunday December 14 2014 15:56:04 EDT Ventricular Rate:  100 PR Interval:  191 QRS Duration: 148 QT Interval:  391 QTC Calculation:  504 R Axis:   100 Text Interpretation:  Sinus tachycardia RBBB and LPFB Probable inferior  infarct, age indeterminate st elevation has resolved  Confirmed by Tyrone Nine  MD, Quillian Quince [phone]) on 12/14/2014 4:22:55 PM      MDM   Final diagnoses:  Stroke  Stroke  Stroke  Stroke    74 yo M with chief complaints of slurred speech perioral numbness right arm weakness spontaneously resolved. Likely TIA. Will obtain a CT of the head CBC BMP EKG troponin urine  CT scan with concerning findings for acute/subacute right parietal infarct. Patient continues to base of the manic. Talked with the neuro hospitalist suggested TIA work up at [facility], will transfer.  The patients results and plan were reviewed and discussed.   Any x-rays performed were independently reviewed by myself.   Differential diagnosis were considered with the presenting HPI.  Medications  ferrous sulfate tablet 325 mg (not administered)  Liraglutide SOPN 1.8 mg (not administered)  clotrimazole (LOTRIMIN) 1 % cream ( Topical Not Given 12/14/14 2210)  isosorbide mononitrate (IMDUR) 24 hr tablet 60 mg (not administered)  nitroGLYCERIN (NITROSTAT) SL tablet 0.4 mg (not administered)  lisinopril (PRINIVIL,ZESTRIL) tablet 20 mg (not administered)  doxazosin (CARDURA) tablet 8 mg (not administered)  simvastatin (ZOCOR) tablet 40 mg (not administered)  ezetimibe (ZETIA) tablet 10 mg (not administered)  insulin glargine (LANTUS) injection 75 Units (not administered)   stroke: mapping our early stages of recovery book (not administered)  senna-docusate (Senokot-S) tablet 1 tablet (not administered)  enoxaparin (LOVENOX) injection 40 mg (not administered)  insulin aspart (novoLOG) injection 0-15 Units (not administered)  insulin aspart (novoLOG) injection 0-5 Units (0 Units Subcutaneous Not Given 12/14/14 2210)  Vitamin D (Ergocalciferol) (DRISDOL) capsule 50,000 Units (not administered)  aspirin tablet 325 mg (not administered)    Filed  Vitals:   12/14/14 1730 12/14/14 1924 12/14/14 2038 12/14/14 2200  BP: 92/52  125/55 145/91  Pulse: 82  55 83  Temp:  98.6 F (37 C)  98.4 F (36.9 C)  TempSrc:    Oral  Resp: '20  21 16  ' Height:    '5\' 9"'  (1.753 m)  Weight:    271 lb 4.8 oz (123.061 kg)  SpO2: 96%  95% 96%    Final diagnoses:  Stroke  Stroke  Stroke  Stroke    Admission/ observation were discussed with the admitting physician, patient and/or family and they are comfortable with the plan.    Deno Etienne, DO 12/14/14 2243

## 2014-12-14 NOTE — H&P (Addendum)
History and Physical:    Jesse Wheeler   VVZ:[id number] DOB: December 13, 1940 DOA: 12/14/2014  Referring MD/provider: Dr. Tyrone Nine PCP: Phineas Inches, MD   Chief Complaint: Perioral numbness and slurred speech  History of Present Illness:   Jesse Wheeler is an 74 y.o. male with a PMH of multiple stroke risk factors including dyslipidemia, obesity, coronary artery disease status post stents 3, type 2 diabetes, and hypertension as well as prior history of smoking who presents with the sudden onset of perioral numbness and right sided paresthesias, associated with dysphagia and slurred speech at approximately 3:30 pm.  Symptoms lasted about 20-30 minutes.  No residual deficits. No aggravating or alleviating factors. He denies any lower extremity symptoms or gait disturbance. The patient underwent a CT of the head in the ED, and was found to have a hypodensity in the right parietal region worrisome for acute or subacute infarct. TPA was not administered secondary to rapid resolution of symptoms with no focal neurological deficits on exam.  ROS:   Review of Systems  Constitutional: Negative for fever, chills, weight loss and malaise/fatigue.  HENT: Negative.   Eyes: Negative.   Respiratory: Negative for cough, sputum production and shortness of breath.   Cardiovascular: Negative for chest pain and palpitations.  Gastrointestinal: Positive for heartburn and diarrhea. Negative for nausea, vomiting, abdominal pain, constipation, blood in stool and melena.  Genitourinary: Negative.   Musculoskeletal: Positive for myalgias and joint pain.  Skin: Positive for itching.  Neurological: Positive for tingling, sensory change, speech change and focal weakness. Negative for dizziness, seizures, loss of consciousness and weakness.  Endo/Heme/Allergies: Negative for environmental allergies and polydipsia. Does not bruise/bleed easily.  Psychiatric/Behavioral: Negative.      Past Medical History:   Past  Medical History  Diagnosis Date  . Coronary artery disease   . Acute inferior myocardial infarction     stenting in 2002 to the RCA  . S/P coronary angioplasty   . Hypertension     LCX  . Hyperlipidemia   . Diabetes mellitus     type II  . Hypercholesterolemia   . Right bundle branch block   . Obesity   . Prostatitis, chronic   . Peyronie's disease   . Tinnitus   . Panic disorder   . Dupuytren's contracture of left hand   . Onychomycosis   . Bowen's disease   . History of colonic polyps 04/14/2014  . Actinic keratoses 08/09/2013    Overview:  IMPRESSION: Liquid nitrogen x2   . ONYCHOMYCOSIS 09/07/2006    Qualifier: Diagnosis of  By: Cletus Gash MD, Cassandria Santee      Past Surgical History:   Past Surgical History  Procedure Laterality Date  . Coronary angioplasty  01/02/01    Acute inferior MI -- Stent placement in the proximal RCA (three separate stents totalling approximately 55 mm of stent with residual thrombus in the posterior descending artery) --  Relatively mild inferior basilar hypokinesia --Severe stenosis in a secondary marginal vessel of the left circumflex -- Minimal coronary atherosclerosis otherwise of the coronary system   . Nuclear stress test  Oct 2011     Dense inferior infarct; mild ischemia with dilated LV. EF was 39%. Study was felt to be unchanged and he has been managed medically  . Cholecystectomy    . Colonoscopy  03/09/2004    Normal  . Cataract extraction w/ intraocular lens  implant, bilateral      Social History:   History   Social History  .  Marital Status: Married    Spouse Name: Meryl Crutch  . Number of Children: 1  . Years of Education: N/A   Occupational History  . Retired    Social History Main Topics  . Smoking status: Former Smoker    Types: Cigarettes    Quit date: 11/17/1981  . Smokeless tobacco: Never Used  . Alcohol Use: 0.0 oz/week    0 Standard drinks or equivalent per week     Comment: occ.  . Drug Use: No  . Sexual Activity:  Not on file   Other Topics Concern  . Not on file   Social History Narrative   He is a retired Firefighter, he also served as a Psychiatrist.   Daily caffeine drinks    He is married and he has 1 son    Family history:   Family History  Problem Relation Age of Onset  . Heart attack Father   . Heart disease Father   . Diabetes Sister   . Colon cancer Neg Hx   . Transient ischemic attack Sister     Allergies   Review of patient's allergies indicates no known allergies.  Current Medications:   Prior to Admission medications   Medication Sig Start Date End Date Taking? Authorizing Provider  aspirin 81 MG tablet Take 81 mg by mouth daily.     Yes Historical Provider, MD  Blood Glucose Monitoring Suppl (ONE TOUCH ULTRA SYSTEM KIT) W/DEVICE KIT 1 kit by Does not apply route once. Take as Directed    Yes Historical Provider, MD  clotrimazole-betamethasone (LOTRISONE) cream Apply 1 application topically daily as needed (itchy skin).  04/14/14  Yes Historical Provider, MD  Cyanocobalamin (VITAMIN B-12 PO) Take 1 tablet by mouth daily.     Yes Historical Provider, MD  doxazosin (CARDURA) 8 MG tablet Take 8 mg by mouth daily.   Yes Historical Provider, MD  ergocalciferol (VITAMIN D2) 50000 UNITS capsule Take 50,000 Units by mouth once a week.   Yes Historical Provider, MD  ezetimibe (ZETIA) 10 MG tablet Take 10 mg by mouth daily.     Yes Historical Provider, MD  ferrous sulfate 325 (65 FE) MG tablet Take 325 mg by mouth daily with breakfast.   Yes Historical Provider, MD  insulin glargine (LANTUS) 100 UNIT/ML injection Inject 75 Units into the skin at bedtime.    Yes Historical Provider, MD  isosorbide mononitrate (IMDUR) 30 MG 24 hr tablet TAKE 2 TABLETS PO DAILY Patient taking differently: Take 60 mg by mouth daily.  05/31/13  Yes Minus Breeding, MD  Liraglutide (VICTOZA [place]) Inject 1.8 mg into the skin daily.    Yes Historical  Provider, MD  lisinopril (PRINIVIL,ZESTRIL) 20 MG tablet Take 1 tablet (20 mg total) by mouth daily. 01/27/12  Yes Burtis Junes, NP  metFORMIN (GLUCOPHAGE) 850 MG tablet Take 850 mg by mouth 3 (three) times daily.     Yes Historical Provider, MD  simvastatin (ZOCOR) 40 MG tablet Take 40 mg by mouth daily.   Yes Historical Provider, MD  nitroGLYCERIN (NITROSTAT) 0.4 MG SL tablet Place 1 tablet (0.4 mg total) under the tongue every 5 (five) minutes as needed. Patient not taking: Reported on 12/14/2014 10/15/12   Minus Breeding, MD    Physical Exam:   Filed Vitals:   12/14/14 1551 12/14/14 1730  BP: 145/68 92/52  Pulse: 97 82  TempSrc: Oral   Resp: 18 20  SpO2: 96% 96%  Physical Exam: Blood pressure 92/52, pulse 82, resp. rate 20, SpO2 96 %. Gen: No acute distress. Head: Normocephalic, atraumatic. Eyes: PERRL, EOMI, sclerae nonicteric. Mouth: Oropharynx Neck: Supple, no thyromegaly, no lymphadenopathy, no jugular venous distention. Chest: Lungs CV: Heart sounds Abdomen: Soft, nontender, nondistended with normal active bowel sounds. Extremities: Extremities Skin: Warm and dry. Neurologic Examination:  Mental Status:  Alert, oriented x 3.  Cranial Nerves:  II: Visual fields grossly normal, pupils equal, round, reactive to light and accommodation  III,IV, VI: ptosis not present, extra-ocular motions intact bilaterally  V,VII: smile symmetric, facial light touch sensation normal bilaterally  VIII: hearing loss right (not new) IX,X: gag not tested  XI: bilateral shoulder shrug  XII: midline tongue extension  Motor:  Right Upper extremity 5/5 Left Upper extremity 5/5  Right Lower extremity 5/5 Left Lower extremity 5/5  Tone and bulk:normal tone throughout; no atrophy noted  No pronator drift Sensory: No frank deficits Psych: Mood and affect normal.   Data Review:    Labs: Basic Metabolic Panel:  Recent Labs Lab 12/14/14 1637  NA 136  K 4.6  CL 108  CO2 20*    GLUCOSE 166*  BUN 21*  CREATININE 1.63*  CALCIUM 8.7*   Liver Function Tests:  Recent Labs Lab 12/14/14 1637  AST 34  ALT 25  ALKPHOS 36*  BILITOT 0.3  PROT 6.6  ALBUMIN 3.8   CBC:  Recent Labs Lab 12/14/14 1637  WBC 6.8  NEUTROABS 4.1  HGB 11.1*  HCT 35.4*  MCV 86.8  PLT 202   CBG:  Recent Labs Lab 12/14/14 1636  GLUCAP 157*    Radiographic Studies: Ct Head Wo Contrast  12/14/2014   CLINICAL DATA:  Stroke-like symptoms, numbness to right perioral area, right arm numbness, slurred speech  EXAM: CT HEAD WITHOUT CONTRAST  TECHNIQUE: Contiguous axial images were obtained from the base of the skull through the vertex without intravenous contrast.  COMPARISON:  None.  FINDINGS: No evidence of parenchymal hemorrhage or extra-axial fluid collection. No mass lesion, mass effect, or midline shift.  Hypodensity in the right parietal region (series 2/ image 26), worrisome for acute/subacute infarct.  Subcortical white matter and periventricular small vessel ischemic changes. Intracranial atherosclerosis.  Cerebral volume is within normal limits.  No ventriculomegaly.  The visualized paranasal sinuses are essentially clear. The mastoid air cells are unopacified.  No evidence of calvarial fracture.  IMPRESSION: Hypodensity in the right parietal region, worrisome for acute/subacute infarct.  These results were called by telephone at the time of interpretation on 12/14/2014 at 4:52 pm to Dr. Deno Etienne , who verbally acknowledged these results.   Electronically Signed   By: Julian Hy M.D.   On: 12/14/2014 16:53   *I have personally reviewed the images above*  EKG: Independently reviewed. Sinus tachycardia at 100 bpm; RBBB and LPFB; Probable inferior infarct given Q waves in inferior leads.   Assessment/Plan:   Principal Problem:   CVA (cerebral infarction) versus TIA - CT images concerning for stroke.  - Admit to stroke unit.  - Stroke order set placed. MRI/MRA of the  brain, carotid Dopplers and 2-D echo requested. - Check hemoglobin A1c and lipid panel.  - He will need good blood pressure and glycemic control as well as lipid control.  - Neuro consult. EDP spoke with Dr. Aram Beecham who requested transfer to [facility].  Active Problems:   Type 2 diabetes with renal manifestations - Creatinine elevated, likely from chronic kidney disease related to type 2 diabetes  and hypertension. - Hold metformin. Continue 75 units of Lantus daily at bedtime and Victoza. - At moderate scale SSI every before meals/at bedtime. - Check hemoglobin A1c.    Dyslipidemia - Continue Zetia and Zocor.  - Follow-up fasting lipid panel.     Essential hypertension - Continue lisinopril. Also on Cardura and Imdur.    CAD (coronary artery disease) - Continue aspirin, lipid control, Imdur and PRN NTG. - History of 3 cardiac stents.     Obese - Advance diet to carb modified/heart healthy when he passes swallowing evaluation.     BPH - Continue Cardura.     DVT prophylaxis - Lovenox ordered.   Code Status: Full. Family Communication: wife updated at the bedside.  Disposition Plan: Home when stable.  Likely 48 hours.  Time spent: One hour.  RAMA,CHRISTINA [facility] Pager [phone] Cell: [phone]   If 7PM-7AM, please contact night-coverage [email or web address] Password [facility] 12/14/2014, 6:12 PM

## 2014-12-15 ENCOUNTER — Inpatient Hospital Stay (HOSPITAL_COMMUNITY): Payer: Medicare Other

## 2014-12-15 ENCOUNTER — Encounter (HOSPITAL_COMMUNITY): Payer: Self-pay | Admitting: *Deleted

## 2014-12-15 DIAGNOSIS — G451 Carotid artery syndrome (hemispheric): Secondary | ICD-10-CM

## 2014-12-15 DIAGNOSIS — I6522 Occlusion and stenosis of left carotid artery: Secondary | ICD-10-CM

## 2014-12-15 LAB — GLUCOSE, CAPILLARY
Glucose-Capillary: 100 mg/dL — ABNORMAL HIGH (ref 65–99)
Glucose-Capillary: 148 mg/dL — ABNORMAL HIGH (ref 65–99)
Glucose-Capillary: 177 mg/dL — ABNORMAL HIGH (ref 65–99)
Glucose-Capillary: 117 mg/dL — ABNORMAL HIGH (ref 65–99)

## 2014-12-15 LAB — LIPID PANEL
CHOL/HDL RATIO: 2.9 ratio
Cholesterol: 120 mg/dL (ref 0–200)
HDL: 42 mg/dL (ref >40)
LDL Cholesterol: 58 mg/dL (ref 0–99)
Triglycerides: 98 mg/dL (ref <150)
VLDL: 20 mg/dL (ref 0–40)

## 2014-12-15 MED ORDER — CLOPIDOGREL BISULFATE 75 MG PO TABS
75.0000 mg | ORAL_TABLET | Freq: Every day | ORAL | Status: DC
  Administered 2014-12-16: 75 mg via ORAL
  Filled 2014-12-15: qty 1

## 2014-12-15 NOTE — Progress Notes (Signed)
[facility] PROGRESS NOTE  Jesse Wheeler ZOX:[id number] DOB: 04/11/1941 DOA: 12/14/2014 PCP: Phineas Inches, MD  Assessment/Plan:  Principal Problem:   CVA (cerebral infarction) Active Problems:   Dyslipidemia   Essential hypertension   CAD (coronary artery disease)   Obese   DM type 2, uncontrolled, with renal complications  workup underway  Code Status:  full Family Communication:   Disposition Plan:  home  Consultants:    Procedures:     Antibiotics:    HPI/Subjective: No further symptoms  Objective: Filed Vitals:   12/15/14 1319  BP: 121/63  Pulse: 77  Temp: 98.5 F (36.9 C)  Resp: 18   No intake or output data in the 24 hours ending 12/15/14 1803 Filed Weights   12/14/14 2200  Weight: 123.061 kg (271 lb 4.8 oz)    Exam:   General:  A and o  Cardiovascular: RRR  Respiratory: CTa  Abdomen: S. NT nD  Ext: no CCE  Basic Metabolic Panel:  Recent Labs Lab 12/14/14 1637 12/14/14 2242  NA 136  --   K 4.6  --   CL 108  --   CO2 20*  --   GLUCOSE 166*  --   BUN 21*  --   CREATININE 1.63* 1.65*  CALCIUM 8.7*  --    Liver Function Tests:  Recent Labs Lab 12/14/14 1637  AST 34  ALT 25  ALKPHOS 36*  BILITOT 0.3  PROT 6.6  ALBUMIN 3.8   No results for input(s): LIPASE, AMYLASE in the last 168 hours. No results for input(s): AMMONIA in the last 168 hours. CBC:  Recent Labs Lab 12/14/14 1637 12/14/14 2242  WBC 6.8 7.2  NEUTROABS 4.1  --   HGB 11.1* 11.1*  HCT 35.4* 34.7*  MCV 86.8 84.8  PLT 202 195   Cardiac Enzymes: No results for input(s): CKTOTAL, CKMB, CKMBINDEX, TROPONINI in the last 168 hours. BNP (last 3 results) No results for input(s): BNP in the last 8760 hours.  ProBNP (last 3 results) No results for input(s): PROBNP in the last 8760 hours.  CBG:  Recent Labs Lab 12/14/14 1636 12/14/14 2122 12/15/14 0632 12/15/14 1130  GLUCAP 157* 105* 100* 148*    No results found for this or any previous  visit (from the past 240 hour(s)).   Studies: Dg Chest 2 View  12/15/2014   CLINICAL DATA:  Stroke.  EXAM: CHEST  2 VIEW  COMPARISON:  None.  FINDINGS: The lungs are clear except for minor interstitial coarsening. There are no effusions. Heart size is upper normal. Hilar and mediastinal contours are unremarkable.  IMPRESSION: No active cardiopulmonary disease.   Electronically Signed   By: Andreas Newport M.D.   On: 12/15/2014 01:06   Ct Head Wo Contrast  12/14/2014   CLINICAL DATA:  Stroke-like symptoms, numbness to right perioral area, right arm numbness, slurred speech  EXAM: CT HEAD WITHOUT CONTRAST  TECHNIQUE: Contiguous axial images were obtained from the base of the skull through the vertex without intravenous contrast.  COMPARISON:  None.  FINDINGS: No evidence of parenchymal hemorrhage or extra-axial fluid collection. No mass lesion, mass effect, or midline shift.  Hypodensity in the right parietal region (series 2/ image 26), worrisome for acute/subacute infarct.  Subcortical white matter and periventricular small vessel ischemic changes. Intracranial atherosclerosis.  Cerebral volume is within normal limits.  No ventriculomegaly.  The visualized paranasal sinuses are essentially clear. The mastoid air cells are unopacified.  No evidence of calvarial fracture.  IMPRESSION: Hypodensity in  the right parietal region, worrisome for acute/subacute infarct.  These results were called by telephone at the time of interpretation on 12/14/2014 at 4:52 pm to Dr. Deno Etienne , who verbally acknowledged these results.   Electronically Signed   By: Julian Hy M.D.   On: 12/14/2014 16:53   Mr Brain Wo Contrast  12/15/2014   CLINICAL DATA:  Initial evaluation for transient weakness and numbness are right sided extremities with slurred speech and dysphagia.  EXAM: MRI HEAD WITHOUT CONTRAST  MRA HEAD WITHOUT CONTRAST  TECHNIQUE: Multiplanar, multiecho pulse sequences of the brain and surrounding structures  were obtained without intravenous contrast. Angiographic images of the head were obtained using MRA technique without contrast.  COMPARISON:  Prior head CT from earlier the same day.  FINDINGS: MRI HEAD FINDINGS  Diffuse prominence of the CSF containing spaces is compatible with generalized cerebral atrophy. Mild patchy and confluent T2/FLAIR hyperintensity within the periventricular and deep white matter both cerebral hemispheres noted, nonspecific, but most likely related to mild chronic small vessel ischemic disease.  There is area of encephalomalacia and laminar necrosis involving the cortex of the right parietal lobe, which corresponds to previously identified hypodensity on prior CT. This lesion demonstrates mildly intense peripheral DWI signal intensity, without corresponding signal loss on ADC map. Finding is most consistent with a late subacute to chronic ischemic infarct. Small amount of chronic hemosiderin staining within this region.  No other foci of abnormal restricted diffusion to suggest acute intracranial infarct. Normal intravascular flow voids are maintained. No acute intracranial hemorrhage.  No mass lesion, midline shift, or mass effect. No hydrocephalus. No extra-axial fluid collection.  Craniocervical junction within normal limits. Pituitary gland normal.  No acute abnormality about the orbits. Sequelae of prior bilateral lens extraction noted.  Mild mucosal thickening present within the ethmoidal air cells, maxillary sinuses, and sphenoid sinuses. No mastoid effusion. Inner ear structures within normal limits.  Bone marrow signal intensity within normal limits. Scalp soft tissues unremarkable.  MRA HEAD FINDINGS  ANTERIOR CIRCULATION:  Visualized portions of the distal cervical segments of the internal carotid arteries are patent with antegrade flow. The petrous segments are well opacified bilaterally. Atheromatous irregularity present within the cavernous/ supraclinoid segments bilaterally.  There is secondary moderate smooth narrowing of the distal cavernous/ supraclinoid right ICA. More focal severe stenosis present within the anterior genu of the cavernous left ICA (series 5, image 59).  Atheromatous irregularity present within the right A1 segment which is slightly hypoplastic. Probable short-segment moderate to severe stenosis within the proximal right A1 segment. Left A1 segment widely patent. Anterior communicating artery normal. Anterior cerebral arteries well opacified bilaterally.  M1 segments widely patent bilaterally without stenosis or occlusion. MCA bifurcations within normal limits. Atheromatous irregularity present within the distal MCA branches bilaterally.  POSTERIOR CIRCULATION:  Vertebral arteries are widely patent to the vertebrobasilar junction. The right vertebral artery is dominant. Right posterior inferior cerebral artery is patent. Left posterior inferior cerebral artery not visualized on this exam. Basilar artery widely patent. Superior cerebellar arteries are patent proximally. Both posterior cerebral arteries arise from the basilar artery and are patent to their distal aspects.  No aneurysm or vascular malformation.  IMPRESSION: MRI HEAD IMPRESSION:  1. No acute intracranial infarct or other abnormality identified. 2. 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 3. Generalized cerebral atrophy with mild chronic small vessel ischemic disease.  MRA HEAD IMPRESSION:  1. No large or proximal arterial branch occlusion within the intracranial circulation. 2. Short  segment severe stenosis within the anterior genu of the cavernous left ICA. 3. Atheromatous irregularity with moderate narrowing of the distal cavernous/supraclinoid right ICA. 4. Short-segment moderate to severe stenosis within the proximal right A1 segment. Left A1 segment is dominant and widely patent.   Electronically Signed   By: Jeannine Boga M.D.   On: 12/15/2014 00:26   Mr Jodene Nam Head/brain Wo  Cm  12/15/2014   CLINICAL DATA:  Initial evaluation for transient weakness and numbness are right sided extremities with slurred speech and dysphagia.  EXAM: MRI HEAD WITHOUT CONTRAST  MRA HEAD WITHOUT CONTRAST  TECHNIQUE: Multiplanar, multiecho pulse sequences of the brain and surrounding structures were obtained without intravenous contrast. Angiographic images of the head were obtained using MRA technique without contrast.  COMPARISON:  Prior head CT from earlier the same day.  FINDINGS: MRI HEAD FINDINGS  Diffuse prominence of the CSF containing spaces is compatible with generalized cerebral atrophy. Mild patchy and confluent T2/FLAIR hyperintensity within the periventricular and deep white matter both cerebral hemispheres noted, nonspecific, but most likely related to mild chronic small vessel ischemic disease.  There is area of encephalomalacia and laminar necrosis involving the cortex of the right parietal lobe, which corresponds to previously identified hypodensity on prior CT. This lesion demonstrates mildly intense peripheral DWI signal intensity, without corresponding signal loss on ADC map. Finding is most consistent with a late subacute to chronic ischemic infarct. Small amount of chronic hemosiderin staining within this region.  No other foci of abnormal restricted diffusion to suggest acute intracranial infarct. Normal intravascular flow voids are maintained. No acute intracranial hemorrhage.  No mass lesion, midline shift, or mass effect. No hydrocephalus. No extra-axial fluid collection.  Craniocervical junction within normal limits. Pituitary gland normal.  No acute abnormality about the orbits. Sequelae of prior bilateral lens extraction noted.  Mild mucosal thickening present within the ethmoidal air cells, maxillary sinuses, and sphenoid sinuses. No mastoid effusion. Inner ear structures within normal limits.  Bone marrow signal intensity within normal limits. Scalp soft tissues unremarkable.   MRA HEAD FINDINGS  ANTERIOR CIRCULATION:  Visualized portions of the distal cervical segments of the internal carotid arteries are patent with antegrade flow. The petrous segments are well opacified bilaterally. Atheromatous irregularity present within the cavernous/ supraclinoid segments bilaterally. There is secondary moderate smooth narrowing of the distal cavernous/ supraclinoid right ICA. More focal severe stenosis present within the anterior genu of the cavernous left ICA (series 5, image 59).  Atheromatous irregularity present within the right A1 segment which is slightly hypoplastic. Probable short-segment moderate to severe stenosis within the proximal right A1 segment. Left A1 segment widely patent. Anterior communicating artery normal. Anterior cerebral arteries well opacified bilaterally.  M1 segments widely patent bilaterally without stenosis or occlusion. MCA bifurcations within normal limits. Atheromatous irregularity present within the distal MCA branches bilaterally.  POSTERIOR CIRCULATION:  Vertebral arteries are widely patent to the vertebrobasilar junction. The right vertebral artery is dominant. Right posterior inferior cerebral artery is patent. Left posterior inferior cerebral artery not visualized on this exam. Basilar artery widely patent. Superior cerebellar arteries are patent proximally. Both posterior cerebral arteries arise from the basilar artery and are patent to their distal aspects.  No aneurysm or vascular malformation.  IMPRESSION: MRI HEAD IMPRESSION:  1. No acute intracranial infarct or other abnormality identified. 2. 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 3. Generalized cerebral atrophy with mild chronic small vessel ischemic disease.  MRA HEAD IMPRESSION:  1. No large or  proximal arterial branch occlusion within the intracranial circulation. 2. 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 3. Atheromatous irregularity  with moderate narrowing of the distal cavernous/supraclinoid right ICA. 4. Short-segment moderate to severe stenosis within the proximal right A1 segment. Left A1 segment is dominant and widely patent.   Electronically Signed   By: Jeannine Boga M.D.   On: 12/15/2014 00:26    Scheduled Meds: . aspirin  325 mg Oral Daily  . clotrimazole   Topical BID  . doxazosin  8 mg Oral Daily  . enoxaparin (LOVENOX) injection  40 mg Subcutaneous Q24H  . ezetimibe  10 mg Oral Daily  . ferrous sulfate  325 mg Oral Q breakfast  . insulin aspart  0-15 Units Subcutaneous TID WC  . insulin aspart  0-5 Units Subcutaneous QHS  . insulin glargine  75 Units Subcutaneous QHS  . isosorbide mononitrate  60 mg Oral Daily  . Liraglutide  1.8 mg Subcutaneous Daily  . lisinopril  20 mg Oral Daily  . simvastatin  40 mg Oral Daily  . Vitamin D (Ergocalciferol)  50,000 Units Oral Q7 days   Continuous Infusions:   Time spent: 25 minutes  [facility]  [email or web address], password [facility] 12/15/2014, 6:03 PM  LOS: 1 day

## 2014-12-15 NOTE — Evaluation (Signed)
Physical Therapy Evaluation and Discharge Patient Details Name: Jesse Wheeler MRN: [id number] DOB: [birth year] Today's Date: 12/15/2014   History of Present Illness  Jesse Wheeler is an 74 y.o. male history of diabetes mellitus, hypertension, hyperlipidemia, coronary artery disease and myocardial infarction, presenting with transient numbness and weakness involving his right upper and lower extremities as well as slurred speech and dysphagia. Symptoms lasted about 15-20 minutes then resolved. MRI revealed   subacute to chronic right parietal infarct.  Clinical Impression  Pt functioning at baseline with mild sensory impairment to face. Pt demo'd R UE and LE strength at 5/5 and demos' minimal falls risk as indicated by score of 23 on DGI. Pt safe to d/c home with spouse once medically cleared. Pt with no further acute skilled PT needs at this time. Please re-consult if needed in future. PT SIGNING OFF.    Follow Up Recommendations No PT follow up;Supervision - Intermittent    Equipment Recommendations  None recommended by PT    Recommendations for Other Services       Precautions / Restrictions Precautions Precautions: None Restrictions Weight Bearing Restrictions: No      Mobility  Bed Mobility Overal bed mobility: Modified Independent             General bed mobility comments: used of UEs but did not use bed rail  Transfers Overall transfer level: Modified independent Equipment used: None             General transfer comment: steady  Ambulation/Gait Ambulation/Gait assistance: Independent Ambulation Distance (Feet): 510 Feet Assistive device: None Gait Pattern/deviations: Step-to pattern;Decreased stride length;Wide base of support Gait velocity: wfl Gait velocity interpretation: at or above normal speed for age/gender General Gait Details: pt with no episodes of instability or LOB. Pt reports "i have a bad back so i tired quickly when i walk."  Stairs Stairs:  Yes Stairs assistance: Modified independent (Device/Increase time) Stair Management: One rail Left;Alternating pattern Number of Stairs: 5 General stair comments: safe technique  Wheelchair Mobility    Modified Rankin (Stroke Patients Only) Modified Rankin (Stroke Patients Only) Pre-Morbid Rankin Score: No symptoms Modified Rankin: No significant disability     Balance Overall balance assessment: No apparent balance deficits (not formally assessed)                               Standardized Balance Assessment Standardized Balance Assessment : Dynamic Gait Index   Dynamic Gait Index Level Surface: Normal Change in Gait Speed: Normal Gait with Horizontal Head Turns: Normal Gait with Vertical Head Turns: Normal Gait and Pivot Turn: Normal Step Over Obstacle: Normal Step Around Obstacles: Normal Steps: Mild Impairment Total Score: 23       Pertinent Vitals/Pain Pain Assessment: No/denies pain    Home Living Family/patient expects to be discharged to:: Private residence Living Arrangements: Spouse/significant other Available Help at Discharge: Family;Available 24 hours/day Type of Home: House Home Access: Stairs to enter Entrance Stairs-Rails: Left Entrance Stairs-Number of Steps: 5 Home Layout: One level Home Equipment: None      Prior Function Level of Independence: Independent         Comments: retired, goes to gym 3x/wk but recently had catarct surgery beginning of july so has had changes in vision limiting activity     Hand Dominance   Dominant Hand: Left    Extremity/Trunk Assessment   Upper Extremity Assessment: Overall WFL for tasks assessed  Lower Extremity Assessment: Overall WFL for tasks assessed      Cervical / Trunk Assessment: Normal  Communication   Communication: No difficulties  Cognition Arousal/Alertness: Awake/alert Behavior During Therapy: WFL for tasks assessed/performed Overall Cognitive Status:  Within Functional Limits for tasks assessed                      General Comments      Exercises        Assessment/Plan    PT Assessment Patent does not need any further PT services  PT Diagnosis Difficulty walking   PT Problem List    PT Treatment Interventions     PT Goals (Current goals can be found in the Care Plan section) Acute Rehab PT Goals Patient Stated Goal: home PT Goal Formulation: With patient    Frequency     Barriers to discharge        Co-evaluation               End of Session   Activity Tolerance: Patient tolerated treatment well Patient left: in bed;with call bell/phone within reach;with family/visitor present Nurse Communication: Mobility status         Time: 0998-3382 PT Time Calculation (min) (ACUTE ONLY): 17 min   Charges:   PT Evaluation $Initial PT Evaluation Tier I: 1 Procedure     PT G CodesKingsley Callander 12/15/2014, 11:26 AM   Kittie Plater, PT, DPT Pager #: [phone] Office #: [phone]

## 2014-12-15 NOTE — Care Management Note (Signed)
Case Management Note  Patient Details  Name: Aramis Weil MRN: [id number] Date of Birth: [birth year]  Subjective/Objective:  74 y.o. M who was admitted with RU/RL numbness and weakness and dysphagia on 12/14/2014. CT/MRI are Negative. PT/OT evaluations recommend no follow up.Lives with wife in Private residence  No CM needs at present.                   Action/Plan: Will continue to follow for CM needs.    Expected Discharge Date:                  Expected Discharge Plan:  Home/Self Care  In-House Referral:     Discharge planning Services     Post Acute Care Choice:    Choice offered to:     DME Arranged:    DME Agency:     HH Arranged:    [place] Agency:     Status of Service:  Completed, signed off  Medicare Important Message Given:    Date Medicare IM Given:    Medicare IM give by:    Date Additional Medicare IM Given:    Additional Medicare Important Message give by:     If discussed at [place] of Stay Meetings, dates discussed:    Additional Comments:  Delrae Sawyers, RN 12/15/2014, 4:47 PM

## 2014-12-15 NOTE — Progress Notes (Signed)
OT Cancellation Note and Discharge  Patient Details Name: Jesse Wheeler MRN: [id number] DOB: 1940/12/10   Cancelled Treatment:    Reason Eval/Treat Not Completed: OT screened, no needs identified, will sign off. Came across pt walking in hallway with PT without A and pt reports that his initial symptoms have resolved. Pt reports he does not foresee any issues with BADLs.  Almon Register [phone] 12/15/2014, 11:16 AM

## 2014-12-15 NOTE — Progress Notes (Signed)
Patient's HR is 35 and is nonsymptomatic. NS with few PAC's, MD notified. Will continue to monitor. Joaquin Bend E, RN 12/15/2014 11:04 PM

## 2014-12-15 NOTE — Progress Notes (Signed)
Pt arrived to unit via [place] from [facility].  [facility] and neurology paged. Pt oriented to unit, staff, and plan of care.  Tele applied and central monitoring notified.  Vitals and assessment stable, see flow sheets.  Will continue to monitor.

## 2014-12-15 NOTE — Progress Notes (Signed)
Utilization review completed. Chozen Latulippe, RN, BSN. 

## 2014-12-15 NOTE — Progress Notes (Signed)
STROKE TEAM PROGRESS NOTE   HISTORY Jesse Wheeler is an 74 y.o. male history of diabetes mellitus, hypertension, hyperlipidemia, coronary artery disease and myocardial infarction, presenting with transient numbness and weakness involving his right upper and lower extremities as well as slurred speech and dysphagia. Symptoms lasted about 15-20 minutes then resolved. Onset was at 3 PM today. There's no previous history of stroke nor TIA. He's been taking aspirin 81 mg per day. CT scan of his head showed an area of hypodensity involving the right parietal region, suspicious for acute ischemic stroke. NIH stroke score at the time of this evaluation was 0.  LSN: 3 PM on 12/14/2014 tPA Given: No: Deficits resolved mRankin:   SUBJECTIVE (INTERVAL HISTORY) His wife is at the bedside.  Overall he feels his condition is completely resolved. He stated that he had right facial droop with slurred speech lasting 15 minutes. Currently back to baseline.   OBJECTIVE Temp:  [97.9 F (36.6 C)-98.6 F (37 C)] 97.9 F (36.6 C) (08/01 0800) Pulse Rate:  [55-97] 88 (08/01 0800) Cardiac Rhythm:  [-] Other (Comment) (08/01 0800) Resp:  [16-21] 16 (08/01 0800) BP: (92-151)/(52-91) 147/74 mmHg (08/01 0800) SpO2:  [95 %-99 %] 98 % (08/01 0800) Weight:  [123.061 kg (271 lb 4.8 oz)] 123.061 kg (271 lb 4.8 oz) (07/31 2200)   Recent Labs Lab 12/14/14 1636 12/14/14 2122 12/15/14 0632  GLUCAP 157* 105* 100*    Recent Labs Lab 12/14/14 1637 12/14/14 2242  NA 136  --   K 4.6  --   CL 108  --   CO2 20*  --   GLUCOSE 166*  --   BUN 21*  --   CREATININE 1.63* 1.65*  CALCIUM 8.7*  --     Recent Labs Lab 12/14/14 1637  AST 34  ALT 25  ALKPHOS 36*  BILITOT 0.3  PROT 6.6  ALBUMIN 3.8    Recent Labs Lab 12/14/14 1637 12/14/14 2242  WBC 6.8 7.2  NEUTROABS 4.1  --   HGB 11.1* 11.1*  HCT 35.4* 34.7*  MCV 86.8 84.8  PLT 202 195   No results for input(s): CKTOTAL, CKMB, CKMBINDEX, TROPONINI in  the last 168 hours. No results for input(s): LABPROT, INR in the last 72 hours.  Recent Labs  12/14/14 1650  COLORURINE YELLOW  LABSPEC 1.012  PHURINE 5.0  GLUCOSEU NEGATIVE  HGBUR NEGATIVE  BILIRUBINUR NEGATIVE  KETONESUR NEGATIVE  PROTEINUR NEGATIVE  UROBILINOGEN 0.2  NITRITE NEGATIVE  LEUKOCYTESUR NEGATIVE       Component Value Date/Time   CHOL 120 12/15/2014 0550   TRIG 98 12/15/2014 0550   HDL 42 12/15/2014 0550   CHOLHDL 2.9 12/15/2014 0550   VLDL 20 12/15/2014 0550   LDLCALC 58 12/15/2014 0550   No results found for: HGBA1C No results found for: LABOPIA, COCAINSCRNUR, LABBENZ, AMPHETMU, THCU, LABBARB  No results for input(s): ETH in the last 168 hours.  I have personally reviewed the radiological images below and agree with the radiology interpretations.  Dg Chest 2 View 12/15/2014    No active cardiopulmonary disease.    Ct Head Wo Contrast 12/14/2014    Hypodensity in the right parietal region, worrisome for acute/subacute infarct.     Mr Brain Wo Contrast 12/15/2014     MRI HEAD 1. No acute intracranial infarct or other abnormality identified.  2. 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  3. Generalized cerebral atrophy with mild chronic small vessel ischemic disease.    MRA HEAD  1. No large or proximal arterial branch occlusion within the intracranial circulation.  2. 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  3. Atheromatous irregularity with moderate narrowing of the distal cavernous/supraclinoid right ICA.  4. Short-segment moderate to severe stenosis within the proximal right A1 segment. Left A1 segment is dominant and widely patent.   2D echo - pending  CUS - pending   PHYSICAL EXAM   Temp:  [97.9 F (36.6 C)-98.6 F (37 C)] 98 F (36.7 C) (08/01 1842) Pulse Rate:  [55-91] 86 (08/01 1842) Resp:  [16-21] 20 (08/01 1842) BP: (118-151)/(55-91) 118/60 mmHg (08/01 1842) SpO2:  [80 %-99  %] 98 % (08/01 1842) Weight:  [271 lb 4.8 oz (123.061 kg)] 271 lb 4.8 oz (123.061 kg) (07/31 2200)  General - Well nourished, well developed, in no apparent distress.  Ophthalmologic - Sharp disc margins OU.  Cardiovascular - Irregular heart rhythm, bradycardia - EKG did not show A. fib but sinus arrhythmia.  Mental Status -  Level of arousal and orientation to time, place, and person were intact. Language including expression, naming, repetition, comprehension was assessed and found intact. Fund of Knowledge was assessed and was intact.  Cranial Nerves II - XII - II - Visual field intact OU. III, IV, VI - Extraocular movements intact. V - Facial sensation intact bilaterally. VII - Facial movement intact bilaterally. VIII - Hearing & vestibular intact bilaterally. X - Palate elevates symmetrically. XI - Chin turning & shoulder shrug intact bilaterally. XII - Tongue protrusion intact.  Motor Strength - The patient's strength was normal in all extremities and pronator drift was absent.  Bulk was normal and fasciculations were absent.   Motor Tone - Muscle tone was assessed at the neck and appendages and was normal.  Reflexes - The patient's reflexes were 1+ in all extremities and he had no pathological reflexes.  Sensory - Light touch, temperature/pinprick, vibration and proprioception, and Romberg testing were assessed and were symmetrical.    Coordination - The patient had normal movements in the hands and feet with no ataxia or dysmetria.  Tremor was absent.  Gait and Station - The patient's transfers, posture, gait, station, and turns were observed as normal   ASSESSMENT/PLAN [name] is a 74 y.o. male with history of diabetes mellitus, hypertension, hyperlipidemia, coronary artery disease and myocardial infarction, presenting with transient right facial droop and slurry speech. He did not receive IV t-PA due to resolution of deficits.  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  Carotid Doppler  pending  2D Echo  pending  LDL 58  HgbA1c pending  Lovenox for VTE prophylaxis  Diet heart healthy/carb modified Room service appropriate?: Yes; Fluid consistency:: Thin  aspirin 81 mg orally every day prior to admission, now on aspirin 325 mg orally every day and plavix 75mg  daily. Due to intracranial stenosis, we recommend dural antiplatelet for 3 months and then Plavix alone (ordered).   Patient counseled to be compliant with his antithrombotic medications  Ongoing aggressive stroke risk factor management  Therapy recommendations:  No follow-up physical therapy  Disposition: Pending  Hypertension  Home meds: lisinopril  Stable  Patient counseled to be compliant with his blood pressure medications  Hyperlipidemia  Home meds: Zetia and simvastatin resumed in hospital  LDL 58, goal < 70  Continue statin at discharge  Diabetes  HgbA1c pending, goal < 7.0  Controlled  Other Stroke Risk Factors  Advanced age  Cigarette smoker, quit smoking in 1983  Obesity, Body mass index is 40.05 kg/(m^2).   Family history of TIA - sister  Coronary artery disease  Other Active Problems  Renal insufficiency  Mild anemia  Other Pertinent History    Hospital day # 1  Rosalin Hawking, MD PhD Stroke Neurology 12/15/2014 7:20 PM    To contact Stroke Continuity provider, please refer to [email or web address] After hours, contact General Neurology

## 2014-12-16 ENCOUNTER — Other Ambulatory Visit: Payer: Self-pay | Admitting: Neurology

## 2014-12-16 ENCOUNTER — Inpatient Hospital Stay (HOSPITAL_COMMUNITY): Payer: Medicare Other

## 2014-12-16 DIAGNOSIS — I6789 Other cerebrovascular disease: Secondary | ICD-10-CM

## 2014-12-16 DIAGNOSIS — G451 Carotid artery syndrome (hemispheric): Secondary | ICD-10-CM

## 2014-12-16 DIAGNOSIS — E785 Hyperlipidemia, unspecified: Secondary | ICD-10-CM

## 2014-12-16 LAB — GLUCOSE, CAPILLARY
GLUCOSE-CAPILLARY: 106 mg/dL — AB (ref 65–99)
Glucose-Capillary: 125 mg/dL — ABNORMAL HIGH (ref 65–99)

## 2014-12-16 LAB — HEMOGLOBIN A1C
Hgb A1c MFr Bld: 7.2 % — ABNORMAL HIGH (ref 4.8–5.6)
MEAN PLASMA GLUCOSE: 160 mg/dL

## 2014-12-16 MED ORDER — ASPIRIN 325 MG PO TABS: 325.0000 mg | ORAL_TABLET | Freq: Every day | ORAL | Status: DC

## 2014-12-16 MED ORDER — CLOPIDOGREL BISULFATE 75 MG PO TABS: 75.0000 mg | ORAL_TABLET | Freq: Every day | ORAL | Status: DC

## 2014-12-16 NOTE — Progress Notes (Signed)
SLP Cancellation Note  Patient Details Name: Jesse Wheeler MRN: [id number] DOB: 1941-04-18   Cancelled treatment:       Reason Eval/Treat Not Completed: SLP screened, no needs identified, will sign off.  Per chart review suspected patient had returned to baseline.  Patient was briefly screened and confirmed that speech and swallow difficulties have resolved.  SLP signing off at this time, please reorder if needed.    Gunnar Fusi, M.A., CCC-SLP [phone]    [place] 12/16/2014, 9:55 AM

## 2014-12-16 NOTE — Progress Notes (Signed)
Discharge orders received. Pt and spouse educated on discharge instructions and stroke prevention. Pt verbalized understanding. Pt given discharge packet and prescriptions. IV and tele removed. Pt taken downstairs by volunteer services via wheelchair.

## 2014-12-16 NOTE — Discharge Summary (Signed)
Physician Discharge Summary  Brysyn Brandenberger HWK:[id number] DOB: December 12, 1940 DOA: 12/14/2014  PCP: Phineas Inches, MD  Admit date: 12/14/2014 Discharge date: 12/16/2014  Time spent: greater than 30 minutes  Recommendations for Outpatient Follow-up:   Recommend 30 day event monitor  Stop aspirin after 3 months, continue plavix  Optimize diabetes management  Discharge Diagnoses:  Principal Problem:   CVA (cerebral infarction) Active Problems:   Dyslipidemia   Essential hypertension   CAD (coronary artery disease)   Obese   TIA (transient ischemic attack)   DM type 2, uncontrolled, with renal complications   Discharge Condition: stable  Diet recommendation: heart healthy carbohydrate modified  Filed Weights   12/14/14 2200  Weight: 123.061 kg (271 lb 4.8 oz)    History of present illness:  74 y.o. male history of diabetes mellitus, hypertension, hyperlipidemia, coronary artery disease and myocardial infarction, presenting with transient numbness and weakness involving his right upper and lower extremities as well as slurred speech and dysphagia. Symptoms lasted about 15-20 minutes then resolved. Onset was at 3 PM today. There's no previous history of stroke nor TIA. He's been taking aspirin 81 mg per day. CT scan of his head showed an area of hypodensity involving the right parietal region, suspicious for acute ischemic stroke. NIH stroke score at the time of this evaluation was 0.  TPA not given. Deficits resolved  Hospital Course:  Admitted to telemetry. Neurology consulted.  TIA: Left hemisphere brain TIA - likely due to left cavernous ICA high-grade stenosis.  Resultant resolution of deficits  MRI no acute abnormality, but subacute or chronic right parietal infarct  MRA Short segment severe stenosis within the anterior genu of the cavernous left ICA.  Carotid Doppler No significant stenosis  2D Echo No source of embolus   Due to chronic right parietal infarct  with current left hemisphere TIA, outpt 30 day cardiac event monitoring recommended to rule out afib.  LDL 58  HgbA1c 7.2  Lovenox for VTE prophylaxis  Diet heart healthy/carb modified Room service appropriate?: Yes; Fluid consistency:: Thin  aspirin 81 mg orally every day prior to admission, now on aspirin 325 mg orally every day and plavix 74m daily. Due to intracranial stenosis, we recommend dural antiplatelet for 3 months and then Plavix alone (ordered).   Essential Hypertension  Home meds: lisinopril  Stable  Hyperlipidemia  Home meds: Zetia and simvastatin resumed in hospital  LDL 58, goal < 70  Continue statin at discharge  Diabetes type 2, uncontrolled  HgbA1c 7.2, goal < 7.0  DM education    Bradycardia, asymptomatic  Procedures:  none  Consultations:  neurology  Discharge Exam: Filed Vitals:   12/16/14 0933  BP: 128/72  Pulse: 79  Temp: 98.4 F (36.9 C)  Resp: 18    General: a and o Cardiovascular: RRR Respiratory: CTA  Discharge Instructions   Discharge Instructions    Activity as tolerated - No restrictions    Complete by:  As directed      Ambulatory referral to Neurology    Complete by:  As directed   Dr. XErlinda Hongrequests followup in 2 months     Diet - low sodium heart healthy    Complete by:  As directed      Diet Carb Modified    Complete by:  As directed      Discharge instructions    Complete by:  As directed   Remain on plavix indefinately. Stop aspirin after 3 months  Current Discharge Medication List    START taking these medications   Details  clopidogrel (PLAVIX) 75 MG tablet Take 1 tablet (75 mg total) by mouth daily. Qty: 30 tablet, Refills: 1      CONTINUE these medications which have CHANGED   Details  aspirin 325 MG tablet Take 1 tablet (325 mg total) by mouth daily. For 3 months only then stop      CONTINUE these medications which have NOT CHANGED   Details  Blood Glucose Monitoring Suppl  (ONE TOUCH ULTRA SYSTEM KIT) W/DEVICE KIT 1 kit by Does not apply route once. Take as Directed     clotrimazole-betamethasone (LOTRISONE) cream Apply 1 application topically daily as needed (itchy skin).  Refills: 0    Cyanocobalamin (VITAMIN B-12 PO) Take 1 tablet by mouth daily.      doxazosin (CARDURA) 8 MG tablet Take 8 mg by mouth daily.    ergocalciferol (VITAMIN D2) 50000 UNITS capsule Take 50,000 Units by mouth once a week.    ezetimibe (ZETIA) 10 MG tablet Take 10 mg by mouth daily.      ferrous sulfate 325 (65 FE) MG tablet Take 325 mg by mouth daily with breakfast.    insulin glargine (LANTUS) 100 UNIT/ML injection Inject 75 Units into the skin at bedtime.     isosorbide mononitrate (IMDUR) 30 MG 24 hr tablet TAKE 2 TABLETS PO DAILY Qty: 60 tablet, Refills: 9   Associated Diagnoses: CAD (coronary artery disease)    Liraglutide (VICTOZA Alden) Inject 1.8 mg into the skin daily.     lisinopril (PRINIVIL,ZESTRIL) 20 MG tablet Take 1 tablet (20 mg total) by mouth daily. Qty: 30 tablet, Refills: 6    metFORMIN (GLUCOPHAGE) 850 MG tablet Take 850 mg by mouth 3 (three) times daily.      simvastatin (ZOCOR) 40 MG tablet Take 40 mg by mouth daily.    nitroGLYCERIN (NITROSTAT) 0.4 MG SL tablet Place 1 tablet (0.4 mg total) under the tongue every 5 (five) minutes as needed. Qty: 25 tablet, Refills: 1       No Known Allergies Follow-up Information    Follow up with Xu,Jindong, MD In 2 months.   Specialty:  Neurology   Why:  [facility]roke [facility], Office will call you with appointment date & time   Contact information:   [address] [place] [address] [phone]        The results of significant diagnostics from this hospitalization (including imaging, microbiology, ancillary and laboratory) are listed below for reference.    Significant Diagnostic Studies: Dg Chest 2 View  12/15/2014   CLINICAL DATA:  Stroke.  EXAM: CHEST  2 VIEW  COMPARISON:  None.   FINDINGS: The lungs are clear except for minor interstitial coarsening. There are no effusions. Heart size is upper normal. Hilar and mediastinal contours are unremarkable.  IMPRESSION: No active cardiopulmonary disease.   Electronically Signed   By: Andreas Newport M.D.   On: 12/15/2014 01:06   Ct Head Wo Contrast  12/14/2014   CLINICAL DATA:  Stroke-like symptoms, numbness to right perioral area, right arm numbness, slurred speech  EXAM: CT HEAD WITHOUT CONTRAST  TECHNIQUE: Contiguous axial images were obtained from the base of the skull through the vertex without intravenous contrast.  COMPARISON:  None.  FINDINGS: No evidence of parenchymal hemorrhage or extra-axial fluid collection. No mass lesion, mass effect, or midline shift.  Hypodensity in the right parietal region (series 2/ image 26), worrisome for acute/subacute infarct.  Subcortical white matter and periventricular small vessel ischemic changes. Intracranial atherosclerosis.  Cerebral volume is within normal limits.  No ventriculomegaly.  The visualized paranasal sinuses are essentially clear. The mastoid air cells are unopacified.  No evidence of calvarial fracture.  IMPRESSION: Hypodensity in the right parietal region, worrisome for acute/subacute infarct.  These results were called by telephone at the time of interpretation on 12/14/2014 at 4:52 pm to Dr. Deno Etienne , who verbally acknowledged these results.   Electronically Signed   By: Julian Hy M.D.   On: 12/14/2014 16:53   Mr Brain Wo Contrast  12/15/2014   CLINICAL DATA:  Initial evaluation for transient weakness and numbness are right sided extremities with slurred speech and dysphagia.  EXAM: MRI HEAD WITHOUT CONTRAST  MRA HEAD WITHOUT CONTRAST  TECHNIQUE: Multiplanar, multiecho pulse sequences of the brain and surrounding structures were obtained without intravenous contrast. Angiographic images of the head were obtained using MRA technique without contrast.  COMPARISON:  Prior  head CT from earlier the same day.  FINDINGS: MRI HEAD FINDINGS  Diffuse prominence of the CSF containing spaces is compatible with generalized cerebral atrophy. Mild patchy and confluent T2/FLAIR hyperintensity within the periventricular and deep white matter both cerebral hemispheres noted, nonspecific, but most likely related to mild chronic small vessel ischemic disease.  There is area of encephalomalacia and laminar necrosis involving the cortex of the right parietal lobe, which corresponds to previously identified hypodensity on prior CT. This lesion demonstrates mildly intense peripheral DWI signal intensity, without corresponding signal loss on ADC map. Finding is most consistent with a late subacute to chronic ischemic infarct. Small amount of chronic hemosiderin staining within this region.  No other foci of abnormal restricted diffusion to suggest acute intracranial infarct. Normal intravascular flow voids are maintained. No acute intracranial hemorrhage.  No mass lesion, midline shift, or mass effect. No hydrocephalus. No extra-axial fluid collection.  Craniocervical junction within normal limits. Pituitary gland normal.  No acute abnormality about the orbits. Sequelae of prior bilateral lens extraction noted.  Mild mucosal thickening present within the ethmoidal air cells, maxillary sinuses, and sphenoid sinuses. No mastoid effusion. Inner ear structures within normal limits.  Bone marrow signal intensity within normal limits. Scalp soft tissues unremarkable.  MRA HEAD FINDINGS  ANTERIOR CIRCULATION:  Visualized portions of the distal cervical segments of the internal carotid arteries are patent with antegrade flow. The petrous segments are well opacified bilaterally. Atheromatous irregularity present within the cavernous/ supraclinoid segments bilaterally. There is secondary moderate smooth narrowing of the distal cavernous/ supraclinoid right ICA. More focal severe stenosis present within the anterior  genu of the cavernous left ICA (series 5, image 59).  Atheromatous irregularity present within the right A1 segment which is slightly hypoplastic. Probable short-segment moderate to severe stenosis within the proximal right A1 segment. Left A1 segment widely patent. Anterior communicating artery normal. Anterior cerebral arteries well opacified bilaterally.  M1 segments widely patent bilaterally without stenosis or occlusion. MCA bifurcations within normal limits. Atheromatous irregularity present within the distal MCA branches bilaterally.  POSTERIOR CIRCULATION:  Vertebral arteries are widely patent to the vertebrobasilar junction. The right vertebral artery is dominant. Right posterior inferior cerebral artery is patent. Left posterior inferior cerebral artery not visualized on this exam. Basilar artery widely patent. Superior cerebellar arteries are patent proximally. Both posterior cerebral arteries arise from the basilar artery and are patent to their distal aspects.  No aneurysm or vascular malformation.  IMPRESSION: MRI HEAD IMPRESSION:  1. No acute intracranial  infarct or other abnormality identified. 2. 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 3. Generalized cerebral atrophy with mild chronic small vessel ischemic disease.  MRA HEAD IMPRESSION:  1. No large or proximal arterial branch occlusion within the intracranial circulation. 2. 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 3. Atheromatous irregularity with moderate narrowing of the distal cavernous/supraclinoid right ICA. 4. Short-segment moderate to severe stenosis within the proximal right A1 segment. Left A1 segment is dominant and widely patent.   Electronically Signed   By: Jeannine Boga M.D.   On: 12/15/2014 00:26   Mr Jodene Nam Head/brain Wo Cm  12/15/2014   CLINICAL DATA:  Initial evaluation for transient weakness and numbness are right sided extremities with slurred speech and  dysphagia.  EXAM: MRI HEAD WITHOUT CONTRAST  MRA HEAD WITHOUT CONTRAST  TECHNIQUE: Multiplanar, multiecho pulse sequences of the brain and surrounding structures were obtained without intravenous contrast. Angiographic images of the head were obtained using MRA technique without contrast.  COMPARISON:  Prior head CT from earlier the same day.  FINDINGS: MRI HEAD FINDINGS  Diffuse prominence of the CSF containing spaces is compatible with generalized cerebral atrophy. Mild patchy and confluent T2/FLAIR hyperintensity within the periventricular and deep white matter both cerebral hemispheres noted, nonspecific, but most likely related to mild chronic small vessel ischemic disease.  There is area of encephalomalacia and laminar necrosis involving the cortex of the right parietal lobe, which corresponds to previously identified hypodensity on prior CT. This lesion demonstrates mildly intense peripheral DWI signal intensity, without corresponding signal loss on ADC map. Finding is most consistent with a late subacute to chronic ischemic infarct. Small amount of chronic hemosiderin staining within this region.  No other foci of abnormal restricted diffusion to suggest acute intracranial infarct. Normal intravascular flow voids are maintained. No acute intracranial hemorrhage.  No mass lesion, midline shift, or mass effect. No hydrocephalus. No extra-axial fluid collection.  Craniocervical junction within normal limits. Pituitary gland normal.  No acute abnormality about the orbits. Sequelae of prior bilateral lens extraction noted.  Mild mucosal thickening present within the ethmoidal air cells, maxillary sinuses, and sphenoid sinuses. No mastoid effusion. Inner ear structures within normal limits.  Bone marrow signal intensity within normal limits. Scalp soft tissues unremarkable.  MRA HEAD FINDINGS  ANTERIOR CIRCULATION:  Visualized portions of the distal cervical segments of the internal carotid arteries are patent  with antegrade flow. The petrous segments are well opacified bilaterally. Atheromatous irregularity present within the cavernous/ supraclinoid segments bilaterally. There is secondary moderate smooth narrowing of the distal cavernous/ supraclinoid right ICA. More focal severe stenosis present within the anterior genu of the cavernous left ICA (series 5, image 59).  Atheromatous irregularity present within the right A1 segment which is slightly hypoplastic. Probable short-segment moderate to severe stenosis within the proximal right A1 segment. Left A1 segment widely patent. Anterior communicating artery normal. Anterior cerebral arteries well opacified bilaterally.  M1 segments widely patent bilaterally without stenosis or occlusion. MCA bifurcations within normal limits. Atheromatous irregularity present within the distal MCA branches bilaterally.  POSTERIOR CIRCULATION:  Vertebral arteries are widely patent to the vertebrobasilar junction. The right vertebral artery is dominant. Right posterior inferior cerebral artery is patent. Left posterior inferior cerebral artery not visualized on this exam. Basilar artery widely patent. Superior cerebellar arteries are patent proximally. Both posterior cerebral arteries arise from the basilar artery and are patent to their distal aspects.  No aneurysm or vascular malformation.  IMPRESSION: MRI HEAD IMPRESSION:  1. No acute intracranial infarct or other abnormality identified. 2. 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 3. Generalized cerebral atrophy with mild chronic small vessel ischemic disease.  MRA HEAD IMPRESSION:  1. No large or proximal arterial branch occlusion within the intracranial circulation. 2. 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 3. Atheromatous irregularity with moderate narrowing of the distal cavernous/supraclinoid right ICA. 4. Short-segment moderate to severe stenosis within the proximal  right A1 segment. Left A1 segment is dominant and widely patent.   Electronically Signed   By: Jeannine Boga M.D.   On: 12/15/2014 00:26   Echo Left ventricle: The cavity size was normal. Wall thickness was increased in a pattern of mild LVH. Systolic function was normal. The estimated ejection fraction was in the range of 50% to 55%. Wall motion was normal; there were no regional wall motion abnormalities. Features are consistent with a pseudonormal left ventricular filling pattern, with concomitant abnormal relaxation and increased filling pressure (grade 2 diastolic dysfunction). - Right ventricle: The cavity size was mildly dilated.  Carotid doppler  - The vertebral arteries appear patent with antegrade flow. - Findings consistent with 1-39 percent stenosis involving the right internal carotid artery and the left internal carotid artery.  Microbiology: No results found for this or any previous visit (from the past 240 hour(s)).   Labs: Basic Metabolic Panel:  Recent Labs Lab 12/14/14 1637 12/14/14 2242  NA 136  --   K 4.6  --   CL 108  --   CO2 20*  --   GLUCOSE 166*  --   BUN 21*  --   CREATININE 1.63* 1.65*  CALCIUM 8.7*  --    Liver Function Tests:  Recent Labs Lab 12/14/14 1637  AST 34  ALT 25  ALKPHOS 36*  BILITOT 0.3  PROT 6.6  ALBUMIN 3.8   No results for input(s): LIPASE, AMYLASE in the last 168 hours. No results for input(s): AMMONIA in the last 168 hours. CBC:  Recent Labs Lab 12/14/14 1637 12/14/14 2242  WBC 6.8 7.2  NEUTROABS 4.1  --   HGB 11.1* 11.1*  HCT 35.4* 34.7*  MCV 86.8 84.8  PLT 202 195   Cardiac Enzymes: No results for input(s): CKTOTAL, CKMB, CKMBINDEX, TROPONINI in the last 168 hours. BNP: BNP (last 3 results) No results for input(s): BNP in the last 8760 hours.  ProBNP (last 3 results) No results for input(s): PROBNP in the last 8760 hours.  CBG:  Recent Labs Lab 12/15/14 1130  12/15/14 1653 12/15/14 2215 12/16/14 0646 12/16/14 1105  GLUCAP 148* 177* 117* 106* 125*       Signed:  Joselynne Killam L  [facility] 12/16/2014, 12:01 PM

## 2014-12-16 NOTE — Progress Notes (Signed)
  Echocardiogram 2D Echocardiogram has been performed.  Jennette Dubin 12/16/2014, 10:40 AM

## 2014-12-16 NOTE — Progress Notes (Signed)
*  PRELIMINARY RESULTS* Vascular Ultrasound Carotid Duplex (Doppler) has been completed.  Preliminary findings: Bilateral:  1-39% ICA stenosis.  Vertebral artery flow is antegrade.      Landry Mellow, RDMS, RVT  12/16/2014, 10:10 AM

## 2014-12-16 NOTE — Progress Notes (Signed)
STROKE TEAM PROGRESS NOTE   HISTORY Jesse Wheeler is an 74 y.o. male history of diabetes mellitus, hypertension, hyperlipidemia, coronary artery disease and myocardial infarction, presenting with transient numbness and weakness involving his right upper and lower extremities as well as slurred speech and dysphagia. Symptoms lasted about 15-20 minutes then resolved. Onset was at 3 PM today. There's no previous history of stroke nor TIA. He's been taking aspirin 81 mg per day. CT scan of his head showed an area of hypodensity involving the right parietal region, suspicious for acute ischemic stroke. NIH stroke score at the time of this evaluation was 0.  LSN: 3 PM on 12/14/2014 tPA Given: No: Deficits resolved mRankin:   SUBJECTIVE (INTERVAL HISTORY) No complaints. It is his Jesse Wheeler and he would like to go home.  OBJECTIVE Temp:  [97.9 F (36.6 C)-98.5 F (36.9 C)] 97.9 F (36.6 C) (08/02 0525) Pulse Rate:  [61-86] 79 (08/02 0525) Cardiac Rhythm:  [-] Normal sinus rhythm (08/01 2013) Resp:  [16-20] 18 (08/02 0525) BP: (112-134)/(60-99) 134/99 mmHg (08/02 0525) SpO2:  [80 %-98 %] 96 % (08/02 0525)   Recent Labs Lab 12/15/14 0632 12/15/14 1130 12/15/14 1653 12/15/14 2215 12/16/14 0646  GLUCAP 100* 148* 177* 117* 106*    Recent Labs Lab 12/14/14 1637 12/14/14 2242  NA 136  --   K 4.6  --   CL 108  --   CO2 20*  --   GLUCOSE 166*  --   BUN 21*  --   CREATININE 1.63* 1.65*  CALCIUM 8.7*  --     Recent Labs Lab 12/14/14 1637  AST 34  ALT 25  ALKPHOS 36*  BILITOT 0.3  PROT 6.6  ALBUMIN 3.8    Recent Labs Lab 12/14/14 1637 12/14/14 2242  WBC 6.8 7.2  NEUTROABS 4.1  --   HGB 11.1* 11.1*  HCT 35.4* 34.7*  MCV 86.8 84.8  PLT 202 195   No results for input(s): CKTOTAL, CKMB, CKMBINDEX, TROPONINI in the last 168 hours. No results for input(s): LABPROT, INR in the last 72 hours.  Recent Labs  12/14/14 1650  COLORURINE YELLOW  LABSPEC 1.012  PHURINE 5.0   GLUCOSEU NEGATIVE  HGBUR NEGATIVE  BILIRUBINUR NEGATIVE  KETONESUR NEGATIVE  PROTEINUR NEGATIVE  UROBILINOGEN 0.2  NITRITE NEGATIVE  LEUKOCYTESUR NEGATIVE       Component Value Date/Time   CHOL 120 12/15/2014 0550   TRIG 98 12/15/2014 0550   HDL 42 12/15/2014 0550   CHOLHDL 2.9 12/15/2014 0550   VLDL 20 12/15/2014 0550   LDLCALC 58 12/15/2014 0550   Lab Results  Component Value Date   HGBA1C 7.2* 12/15/2014   No results found for: LABOPIA, COCAINSCRNUR, LABBENZ, AMPHETMU, THCU, LABBARB  No results for input(s): ETH in the last 168 hours.  I have personally reviewed the radiological images below and agree with the radiology interpretations.  Dg Chest 2 View 12/15/2014    No active cardiopulmonary disease.    Ct Head Wo Contrast 12/14/2014    Hypodensity in the right parietal region, worrisome for acute/subacute infarct.    MRI HEAD 12/15/2014    1. No acute intracranial infarct or other abnormality identified.  2. 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  3. Generalized cerebral atrophy with mild chronic small vessel ischemic disease.    MRA HEAD 12/15/2014    1. No large or proximal arterial branch occlusion within the intracranial circulation.  2. Short segment severe stenosis within the anterior genu of the  cavernous left ICA.  3. Atheromatous irregularity with moderate narrowing of the distal cavernous/supraclinoid right ICA.  4. Short-segment moderate to severe stenosis within the proximal right A1 segment. Left A1 segment is dominant and widely patent.   2D echo  - Left ventricle: The cavity size was normal. Wall thickness wasincrease in a pattern of mild LVH. Systolic function was normal.The estimated ejection fraction was in the range of 50% to 55%.Wall motion was normal; there were no regional wall motionabnormalities. Features are consistent with a pseudonormal leftventricular filling pattern, with concomitant abnormal relaxationand  increased filling pressure (grade 2 diastolic dysfunction). - Right ventricle: The cavity size was mildly dilated.  Carotid Doppler  There is 1-39% bilateral ICA stenosis. Vertebral artery flow is antegrade.     PHYSICAL EXAM   Temp:  [97.9 F (36.6 C)-98.5 F (36.9 C)] 97.9 F (36.6 C) (08/02 0525) Pulse Rate:  [61-86] 79 (08/02 0525) Resp:  [16-20] 18 (08/02 0525) BP: (112-134)/(60-99) 134/99 mmHg (08/02 0525) SpO2:  [80 %-98 %] 96 % (08/02 0525)  General - Well nourished, well developed, in no apparent distress.  Ophthalmologic - Sharp disc margins OU.  Cardiovascular - Irregular heart rhythm, bradycardia - EKG did not show A. fib but sinus arrhythmia.  Mental Status -  Level of arousal and orientation to time, place, and person were intact. Language including expression, naming, repetition, comprehension was assessed and found intact. Fund of Knowledge was assessed and was intact.  Cranial Nerves II - XII - II - Visual field intact OU. III, IV, VI - Extraocular movements intact. V - Facial sensation intact bilaterally. VII - Facial movement intact bilaterally. VIII - Hearing & vestibular intact bilaterally. X - Palate elevates symmetrically. XI - Chin turning & shoulder shrug intact bilaterally. XII - Tongue protrusion intact.  Motor Strength - The patient's strength was normal in all extremities and pronator drift was absent.  Bulk was normal and fasciculations were absent.   Motor Tone - Muscle tone was assessed at the neck and appendages and was normal.  Reflexes - The patient's reflexes were 1+ in all extremities and he had no pathological reflexes.  Sensory - Light touch, temperature/pinprick, vibration and proprioception, and Romberg testing were assessed and were symmetrical.    Coordination - The patient had normal movements in the hands and feet with no ataxia or dysmetria.  Tremor was absent.  Gait and Station - The patient's transfers, posture, gait,  station, and turns were observed as normal   ASSESSMENT/PLAN [name] is a 74 y.o. male with history of diabetes mellitus, hypertension, hyperlipidemia, coronary artery disease and myocardial infarction, presenting with transient right facial droop and slurry speech. He did not receive IV t-PA due to resolution of deficits.  TIA:  Left hemisphere brain TIA - likely due to left cavernous ICA high-grade stenosis.  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  Carotid Doppler  No significant stenosis   2D Echo  No source of embolus   Due to chronic right parietal infarct with current left hemisphere TIA, outpt 30 day cardiac event monitoring recommended to rule out afib.  LDL 58  HgbA1c 7.2  Lovenox for VTE prophylaxis Diet heart healthy/carb modified Room service appropriate?: Yes; Fluid consistency:: Thin  aspirin 81 mg orally every day prior to admission, now on aspirin 325 mg orally every  day and plavix 75mg  daily. Due to intracranial stenosis, we recommend dural antiplatelet for 3 months and then Plavix alone (ordered).   Patient counseled to be compliant with his antithrombotic medications  Ongoing aggressive stroke risk factor management  Therapy recommendations:  No follow-up physical therapy  Disposition: return home  Essential Hypertension  Home meds: lisinopril  Stable  Patient counseled to be compliant with his blood pressure medications  Hyperlipidemia  Home meds: Zetia and simvastatin resumed in hospital  LDL 58, goal < 70  Continue statin at discharge  Diabetes type 2, uncontrolled  HgbA1c 7.2, goal < 7.0  DM education  Follow up with PCP closely  Management as per primary team  Other Stroke Risk Factors  Advanced age  Cigarette smoker, quit smoking in 1983  Morbid Obesity, Body  mass index is 40.05 kg/(m^2).   Family history of TIA - sister  Coronary artery disease  Other Active Problems  Renal insufficiency  Mild anemia  Bradycardia, 35, asymptomatic   NOTHING FURTHER TO ADD FROM THE STROKE STANDPOINT  Patient has a 10-15% risk of having another stroke over the next year, the highest risk is within 2 weeks of the most recent stroke/TIA (risk of having a stroke following a stroke or TIA is the same).  Ongoing risk factor control by Primary Care Physician  Stroke Service will sign off. Please call should any needs arise.  Follow-up Stroke Clinic at [facility] with Dr. Rosalin Hawking in 2 months, order placed.   Hospital day # 2  Neurology will sign off. Please call with questions. Pt will follow up with Dr. Erlinda Hong at [facility] in about 2 months. Thanks for the consult.  Rosalin Hawking, MD PhD Stroke Neurology 12/16/2014 11:06 PM    To contact Stroke Continuity provider, please refer to [email or web address] After hours, contact General Neurology

## 2014-12-23 ENCOUNTER — Other Ambulatory Visit: Payer: Self-pay | Admitting: Neurology

## 2014-12-23 DIAGNOSIS — I4891 Unspecified atrial fibrillation: Secondary | ICD-10-CM

## 2014-12-23 DIAGNOSIS — G459 Transient cerebral ischemic attack, unspecified: Secondary | ICD-10-CM

## 2014-12-24 ENCOUNTER — Ambulatory Visit (INDEPENDENT_AMBULATORY_CARE_PROVIDER_SITE_OTHER): Payer: Medicare Other

## 2014-12-24 DIAGNOSIS — I4891 Unspecified atrial fibrillation: Secondary | ICD-10-CM | POA: Diagnosis not present

## 2014-12-24 DIAGNOSIS — G459 Transient cerebral ischemic attack, unspecified: Secondary | ICD-10-CM

## 2014-12-30 ENCOUNTER — Telehealth: Payer: Self-pay | Admitting: Cardiology

## 2014-12-30 NOTE — Telephone Encounter (Signed)
Patient told they may be moved around just keep in the general vicinity it is there now

## 2014-12-30 NOTE — Telephone Encounter (Signed)
Pt had a monitor put on last week. He says it is getting sore where the electrode patches are. He wants to know if he can move them around please?

## 2014-12-31 ENCOUNTER — Telehealth: Payer: Self-pay | Admitting: Cardiology

## 2014-12-31 NOTE — Telephone Encounter (Signed)
Pt's wife is calling in stating that they are having problems with his monitor going off. She is wondering if it is the "liter" . Pt would like to come in and get it looked at. Please f/u with her  Thanks

## 2014-12-31 NOTE — Telephone Encounter (Signed)
LM for Jesse Wheeler that they should contact [facility] @ [phone] as the monitor was placed there and they may have more tips for troublshooting monitor or advised patient contact the monitor company

## 2015-02-06 ENCOUNTER — Ambulatory Visit (INDEPENDENT_AMBULATORY_CARE_PROVIDER_SITE_OTHER): Payer: Medicare Other | Admitting: Pharmacist Clinician (PhC)/ Clinical Pharmacy Specialist

## 2015-02-06 DIAGNOSIS — I4891 Unspecified atrial fibrillation: Secondary | ICD-10-CM

## 2015-02-06 MED ORDER — RIVAROXABAN 15 MG PO TABS: ORAL_TABLET | ORAL | Status: DC

## 2015-02-06 NOTE — Progress Notes (Signed)
Pt was started on Xarelto for atrial fibrillation/stroke on September 23.    Reviewed patients medication list.  Pt is not currently on any combined P-gp and strong CYP3A4 inhibitors/inducers (ketoconazole, traconazole, ritonavir, carbamazepine, phenytoin, rifampin, St. John's wort).  Reviewed labs.  SCr 1.65, Weight 123 kg, CrCl- 40 (using IBW).  Dose appropriate based on CrCl.   Hgb and HCT 11.1/34.7  A full discussion of the nature of anticoagulants has been carried out.  A benefit/risk analysis has been presented to the patient, so that they understand the justification for choosing anticoagulation with Xarelto at this time.  The need for compliance is stressed.  Pt is aware to take the medication once daily with the largest meal of the day.  Side effects of potential bleeding are discussed, including unusual colored urine or stools, coughing up blood or coffee ground emesis, nose bleeds or serious fall or head trauma.  Discussed signs and symptoms of stroke. The patient should avoid any OTC items containing aspirin or ibuprofen.  Avoid alcohol consumption.   Call if any signs of abnormal bleeding.  Discussed financial obligations and resolved any difficulty in obtaining medication.    Patient to start on 15 mg once daily, will take with breakfast

## 2015-02-06 NOTE — Patient Instructions (Signed)
Start Xarelto 15 mg once daily with breakfast.  Stop aspirin and clopidogrel (Plavix).

## 2015-02-09 ENCOUNTER — Ambulatory Visit: Payer: Medicare Other | Admitting: Neurology

## 2015-02-12 ENCOUNTER — Ambulatory Visit (INDEPENDENT_AMBULATORY_CARE_PROVIDER_SITE_OTHER): Payer: Medicare Other | Admitting: Cardiology

## 2015-02-12 ENCOUNTER — Encounter: Payer: Self-pay | Admitting: Neurology

## 2015-02-12 ENCOUNTER — Ambulatory Visit (INDEPENDENT_AMBULATORY_CARE_PROVIDER_SITE_OTHER): Payer: Medicare Other | Admitting: Neurology

## 2015-02-12 VITALS — BP 118/50 | HR 70 | Ht 70.0 in | Wt 272.9 lb

## 2015-02-12 VITALS — BP 105/69 | HR 94 | Ht 70.0 in | Wt 272.8 lb

## 2015-02-12 DIAGNOSIS — G459 Transient cerebral ischemic attack, unspecified: Secondary | ICD-10-CM | POA: Diagnosis not present

## 2015-02-12 DIAGNOSIS — I2510 Atherosclerotic heart disease of native coronary artery without angina pectoris: Secondary | ICD-10-CM | POA: Diagnosis not present

## 2015-02-12 MED ORDER — RIVAROXABAN 15 MG PO TABS: ORAL_TABLET | ORAL | Status: AC

## 2015-02-12 NOTE — Patient Instructions (Signed)
Your physician wants you to follow-up in: 6 Months You will receive a reminder letter in the mail two months in advance. If you don't receive a letter, please call our office to schedule the follow-up appointment.  

## 2015-02-12 NOTE — Patient Instructions (Signed)
I had a long d/w patient and his wife about his recent TIA and silent stroke, risk for recurrent stroke/TIAs, personally independently reviewed imaging studies and stroke evaluation results and answered questions.Continue xarelto for atrial fibrillation for secondary stroke prevention and maintain strict control of hypertension with blood pressure goal below 130/90, diabetes with hemoglobin A1c goal below 6.5% and lipids with LDL cholesterol goal below 100 mg/dL. I also advised the patient to eat a healthy diet with plenty of whole grains, cereals, fruits and vegetables, exercise regularly and maintain ideal body weight Followup in the future with me in  6 months or call earlier if necessary.  Stroke Prevention Some medical conditions and behaviors are associated with an increased chance of having a stroke. You may prevent a stroke by making healthy choices and managing medical conditions. HOW CAN I REDUCE MY RISK OF HAVING A STROKE?   Stay physically active. Get at least 30 minutes of activity on most or all days.  Do not smoke. It may also be helpful to avoid exposure to secondhand smoke.  Limit alcohol use. Moderate alcohol use is considered to be:  No more than 2 drinks per day for men.  No more than 1 drink per day for nonpregnant women.  Eat healthy foods. This involves:  Eating 5 or more servings of fruits and vegetables a day.  Making dietary changes that address high blood pressure (hypertension), high cholesterol, diabetes, or obesity.  Manage your cholesterol levels.  Making food choices that are high in fiber and low in saturated fat, trans fat, and cholesterol may control cholesterol levels.  Take any prescribed medicines to control cholesterol as directed by your health care provider.  Manage your diabetes.  Controlling your carbohydrate and sugar intake is recommended to manage diabetes.  Take any prescribed medicines to control diabetes as directed by your health care  provider.  Control your hypertension.  Making food choices that are low in salt (sodium), saturated fat, trans fat, and cholesterol is recommended to manage hypertension.  Take any prescribed medicines to control hypertension as directed by your health care provider.  Maintain a healthy weight.  Reducing calorie intake and making food choices that are low in sodium, saturated fat, trans fat, and cholesterol are recommended to manage weight.  Stop drug abuse.  Avoid taking birth control pills.  Talk to your health care provider about the risks of taking birth control pills if you are over 28 years old, smoke, get migraines, or have ever had a blood clot.  Get evaluated for sleep disorders (sleep apnea).  Talk to your health care provider about getting a sleep evaluation if you snore a lot or have excessive sleepiness.  Take medicines only as directed by your health care provider.  For some people, aspirin or blood thinners (anticoagulants) are helpful in reducing the risk of forming abnormal blood clots that can lead to stroke. If you have the irregular heart rhythm of atrial fibrillation, you should be on a blood thinner unless there is a good reason you cannot take them.  Understand all your medicine instructions.  Make sure that other conditions (such as anemia or atherosclerosis) are addressed. SEEK IMMEDIATE MEDICAL CARE IF:   You have sudden weakness or numbness of the face, arm, or leg, especially on one side of the body.  Your face or eyelid droops to one side.  You have sudden confusion.  You have trouble speaking (aphasia) or understanding.  You have sudden trouble seeing in one or  both eyes.  You have sudden trouble walking.  You have dizziness.  You have a loss of balance or coordination.  You have a sudden, severe headache with no known cause.  You have new chest pain or an irregular heartbeat. Any of these symptoms may represent a serious problem that is  an emergency. Do not wait to see if the symptoms will go away. Get medical help at once. Call your local emergency services (911 in [place] Do not drive yourself to the hospital. Document Released: 06/09/2004 Document Revised: 09/16/2013 Document Reviewed: 11/02/2012 [facility] Patient Information 2015 [place], [place]. This information is not intended to replace advice given to you by your health care provider. Make sure you discuss any questions you have with your health care provider.

## 2015-02-12 NOTE — Progress Notes (Signed)
HPI The patient presents for followup of coronary disease.   Since I last saw him he was hosptialized with a left hemisphere brain TIA.  He went home of ASA and Plavix.  However, he was noted on event monitor to have atrial fib briefly.  The rate was controlled and he does not feel this.  He came back to our warfarin clinica and was started on Xarelto.   He came back to discuss this.  The patient denies any new symptoms such as chest discomfort, neck or arm discomfort. There has been no new shortness of breath, PND or orthopnea. There have been no reported palpitations, presyncope or syncope.  Of note during the hospitalization his EF which had been 40% in the past was 50%.    .No Known Allergies  Current Outpatient Prescriptions  Medication Sig Dispense Refill  . Blood Glucose Monitoring Suppl (FIFTY50 GLUCOSE METER 2.0) W/DEVICE KIT by Does not apply route.    . Cyanocobalamin (VITAMIN B-12 PO) Take 1 tablet by mouth daily.      Marland Kitchen doxazosin (CARDURA) 8 MG tablet Take 8 mg by mouth daily.    . ergocalciferol (VITAMIN D2) 50000 UNITS capsule Take 50,000 Units by mouth once a week.    . ezetimibe (ZETIA) 10 MG tablet Take 10 mg by mouth daily.      . ferrous sulfate 325 (65 FE) MG tablet Take 325 mg by mouth daily with breakfast.    . insulin glargine (LANTUS) 100 UNIT/ML injection Inject 75 Units into the skin at bedtime.     . isosorbide mononitrate (IMDUR) 30 MG 24 hr tablet TAKE 2 TABLETS PO DAILY (Patient taking differently: Take 60 mg by mouth daily. ) 60 tablet 9  . Liraglutide (VICTOZA Yoe) Inject 1.8 mg into the skin daily.     Marland Kitchen lisinopril (PRINIVIL,ZESTRIL) 20 MG tablet Take 1 tablet (20 mg total) by mouth daily. 30 tablet 6  . metFORMIN (GLUCOPHAGE) 850 MG tablet Take 850 mg by mouth 3 (three) times daily.      . nitroGLYCERIN (NITROSTAT) 0.4 MG SL tablet Place 1 tablet (0.4 mg total) under the tongue every 5 (five) minutes as needed. 25 tablet 1  . ONETOUCH VERIO test strip   1  .  Rivaroxaban (XARELTO) 15 MG TABS tablet Take 1 tablet by mouth once daily with a meal 30 tablet 5  . Rivaroxaban (XARELTO) 15 MG TABS tablet Take by mouth.    . simvastatin (ZOCOR) 40 MG tablet Take 40 mg by mouth daily.     No current facility-administered medications for this visit.    Past Medical History  Diagnosis Date  . Coronary artery disease   . Acute inferior myocardial infarction     stenting in 2002 to the RCA  . S/P coronary angioplasty   . Hypertension     LCX  . Hyperlipidemia   . Diabetes mellitus     type II  . Hypercholesterolemia   . Right bundle branch block   . Obesity   . Prostatitis, chronic   . Peyronie's disease   . Tinnitus   . Panic disorder   . Dupuytren's contracture of left hand   . Onychomycosis   . Bowen's disease   . History of colonic polyps 04/14/2014  . Actinic keratoses 08/09/2013    Overview:  IMPRESSION: Liquid nitrogen x2   . ONYCHOMYCOSIS 09/07/2006    Qualifier: Diagnosis of  By: Cletus Gash MD, [place]    . Stroke  Past Surgical History  Procedure Laterality Date  . Coronary angioplasty  01/02/01    Acute inferior MI -- Stent placement in the proximal RCA (three separate stents totalling approximately 55 mm of stent with residual thrombus in the posterior descending artery) --  Relatively mild inferior basilar hypokinesia --Severe stenosis in a secondary marginal vessel of the left circumflex -- Minimal coronary atherosclerosis otherwise of the coronary system   . Nuclear stress test  Oct 2011     Dense inferior infarct; mild ischemia with dilated LV. EF was 39%. Study was felt to be unchanged and he has been managed medically  . Cholecystectomy    . Colonoscopy  03/09/2004    Normal  . Cataract extraction w/ intraocular lens  implant, bilateral      ROS:  As stated in the HPI and negative for all other systems.   PHYSICAL EXAM BP 118/50 mmHg  Pulse 70  Ht '5\' 10"'  (1.778 m)  Wt 272 lb 14.4 oz (123.787 kg)  BMI 39.16  kg/m2 GENERAL:  Well appearing NECK:  No jugular venous distention, waveform within normal limits, carotid upstroke brisk and symmetric, no bruits, no thyromegaly LUNGS:  Clear to auscultation bilaterally BACK:  No CVA tenderness CHEST:  Unremarkable HEART:  PMI not displaced or sustained,S1 and S2 within normal limits, no S3, no S4, no clicks, no rubs, no murmurs ABD:  Flat, positive bowel sounds normal in frequency in pitch, no bruits, no rebound, no guarding, no midline pulsatile mass, no hepatomegaly, no splenomegaly, obese EXT:  2 plus pulses throughout, no edema, no cyanosis no clubbing SKIN:  No rashes no nodules   ASSESSMENT AND PLAN  CAD: No new symptoms since his last stress test. Continue with risk reduction.   ATRIAL FIB:   Transient asymptomatic atrial fibrillation. Previous TIA he will be on Xarelto.   OBESITY:  he will continue with diet and exercise.  CARDIOMYOPATHY:  His ejection fraction was slightly reduced but better than previous. No change in therapy is indicated.  Hospital chart reviewed.

## 2015-02-12 NOTE — Progress Notes (Signed)
[facility] [address] [place]. [place] [address] [phone]       OFFICE FOLLOW-UP NOTE  Mr. Tallan Sandoz Date of Birth:  August 10, 1940 Medical Record Number:  [id number]   HPI:  Mr Melendrez is a 74 year old Caucasian male seen today for follow-up following hospital admission for TIA and stroke in July 2016. Branden Shallenberger is an 74 y.o. male history of diabetes mellitus, hypertension, hyperlipidemia, coronary artery disease and myocardial infarction, presenting with transient numbness and weakness involving his right upper and lower extremities as well as slurred speech and dysphagia. Symptoms lasted about 15-20 minutes then resolved. Onset was at 3 PM today. There's no previous history of stroke nor TIA. He's been taking aspirin 81 mg per day. CT scan of his head showed an area of hypodensity involving the right parietal region, suspicious for acute ischemic stroke. NIH stroke score at the time of this evaluation was 0. LSN: 3 PM on 12/14/2014 tPA Given: No: Deficits resolved.. CT scan of the head showed a hypodensity in the right parietal region worrisome for subacute to chronic infarct. 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 Transthoraxic echo showed normal ejection fraction. Carotid ultrasound showed no significant external cranial stenosis. M1 and A1 C was elevated at 7.2 LDL cholesterols 58 mg percent. Patient was started on dual antiplatelets therapy due to intracranial stenosis. He states his done well since discharge but subsequently was found to have atrial fibrillation and hence is anteverted therapy was changed to Xarelto which he seems to tolerating well without significant bleeding or bruising. He states his blood pressure is under good control  and today it is 105/69. He states his sugars are also well controlled. He has no complaints today.  ROS:   14 system review of systems is positive for  hearing loss, easy bruising and all other systems negative PMH:  Past Medical History  Diagnosis Date  . Coronary artery disease   . Acute inferior myocardial infarction     stenting in 2002 to the RCA  . S/P coronary angioplasty   . Hypertension     LCX  . Hyperlipidemia   . Diabetes mellitus     type II  . Hypercholesterolemia   . Right bundle branch block   . Obesity   . Prostatitis, chronic   . Peyronie's disease   . Tinnitus   . Panic disorder   . Dupuytren's contracture of left hand   . Onychomycosis   . Bowen's disease   . History of colonic polyps 04/14/2014  . Actinic keratoses 08/09/2013    Overview:  IMPRESSION: Liquid nitrogen x2   . ONYCHOMYCOSIS 09/07/2006    Qualifier: Diagnosis of  By: Cletus Gash MD, [place]    . Stroke     Social History:  Social History   Social History  . Marital Status: Married    Spouse Name: Meryl Crutch  . Number of Children: 1  . Years of Education: N/A   Occupational History  . Retired    Social History Main Topics  . Smoking status: Former Smoker    Types: Cigarettes    Quit date: 11/17/1981  . Smokeless tobacco: Never Used  . Alcohol Use: No  . Drug Use: No  . Sexual Activity: Not on file   Other  Topics Concern  . Not on file   Social History Narrative   He is a retired Firefighter, he also served as a Psychiatrist.   Daily caffeine drinks    He is married and he has 1 son    Medications:   Current Outpatient Prescriptions on File Prior to Visit  Medication Sig Dispense Refill  . Cyanocobalamin (VITAMIN B-12 PO) Take 1 tablet by mouth daily.      Marland Kitchen doxazosin (CARDURA) 8 MG tablet Take 8 mg by mouth daily.    . ergocalciferol (VITAMIN D2) 50000 UNITS capsule Take 50,000 Units by mouth once a week.    . ezetimibe  (ZETIA) 10 MG tablet Take 10 mg by mouth daily.      . ferrous sulfate 325 (65 FE) MG tablet Take 325 mg by mouth daily with breakfast.    . insulin glargine (LANTUS) 100 UNIT/ML injection Inject 75 Units into the skin at bedtime.     . isosorbide mononitrate (IMDUR) 30 MG 24 hr tablet TAKE 2 TABLETS PO DAILY (Patient taking differently: Take 60 mg by mouth daily. ) 60 tablet 9  . Liraglutide (VICTOZA [place]) Inject 1.8 mg into the skin daily.     Marland Kitchen lisinopril (PRINIVIL,ZESTRIL) 20 MG tablet Take 1 tablet (20 mg total) by mouth daily. 30 tablet 6  . metFORMIN (GLUCOPHAGE) 850 MG tablet Take 850 mg by mouth 3 (three) times daily.      . nitroGLYCERIN (NITROSTAT) 0.4 MG SL tablet Place 1 tablet (0.4 mg total) under the tongue every 5 (five) minutes as needed. 25 tablet 1  . simvastatin (ZOCOR) 40 MG tablet Take 40 mg by mouth daily.    . Rivaroxaban (XARELTO) 15 MG TABS tablet Take 1 tablet by mouth once daily with a meal 90 tablet 3   No current facility-administered medications on file prior to visit.    Allergies:  No Known Allergies  Physical Exam General: well developed, well nourished elderly Caucasian male, seated, in no evident distress Head: head normocephalic and atraumatic.  Neck: supple with no carotid or supraclavicular bruits Cardiovascular: regular rate and rhythm, no murmurs Musculoskeletal: no deformity Skin:  no rash/petichiae Vascular:  Normal pulses all extremities Filed Vitals:   02/12/15 1328  BP: 105/69  Pulse: 94   Neurologic Exam Mental Status: Awake and fully alert. Oriented to place and time. Recent and remote memory intact. Attention span, concentration and fund of knowledge appropriate. Mood and affect appropriate.  Cranial Nerves: Fundoscopic exam reveals sharp disc margins. Pupils equal, briskly reactive to light. Extraocular movements full without nystagmus. Visual fields full to confrontation. Hearing diminished bilaterally.. Facial sensation intact. Face,  tongue, palate moves normally and symmetrically.  Motor: Normal bulk and tone. Normal strength in all tested extremity muscles. Sensory.: intact to touch ,pinprick .position and vibratory sensation.  Coordination: Rapid alternating movements normal in all extremities. Finger-to-nose and heel-to-shin performed accurately bilaterally. Gait and Station: Arises from chair without difficulty. Stance is normal. Gait demonstrates normal stride length and balance . Unable to heel, toe and tandem walk without difficulty.  Reflexes: 1+ and symmetric. Toes downgoing.   NIHSS  0Modified Rankin  1  ASSESSMENT: 17 year patient with left hemispheric TIA in July 2016 as well as subacute right parietal silent infarct with subsequent diagnosis of atrial fibrillation.    PLAN: I had a long d/w patient and his wife about his recent TIA and silent stroke, risk for recurrent stroke/TIAs,  personally independently reviewed imaging studies and stroke evaluation results and answered questions.Continue xarelto for atrial fibrillation for secondary stroke prevention and maintain strict control of hypertension with blood pressure goal below 130/90, diabetes with hemoglobin A1c goal below 6.5% and lipids with LDL cholesterol goal below 100 mg/dL. I also advised the patient to eat a healthy diet with plenty of whole grains, cereals, fruits and vegetables, exercise regularly and maintain ideal body weight Greater than 50% of time during this 25 minute visit was spent on counseling, discussion with patient and family and coordination of care .Followup in the future with me in  6 months or call earlier if necessary.  Antony Contras, MD Note: This document was prepared with digital dictation and possible smart phrase technology. Any transcriptional errors that result from this process are unintentional

## 2015-02-13 ENCOUNTER — Encounter: Payer: Self-pay | Admitting: Cardiology

## 2015-08-12 ENCOUNTER — Ambulatory Visit: Payer: Medicare Other | Admitting: Neurology

## 2015-09-18 ENCOUNTER — Telehealth: Payer: Self-pay | Admitting: *Deleted

## 2015-09-18 NOTE — Telephone Encounter (Signed)
LVM requesting call back, re: appointment next week. Left name, number, office hours.

## 2015-09-21 ENCOUNTER — Telehealth: Payer: Self-pay | Admitting: *Deleted

## 2015-09-21 NOTE — Telephone Encounter (Signed)
LVM requesting call back, re: would patient agree to see Daun Peacock NP on Thursday, 09/24/15. Left name , number.

## 2015-09-24 ENCOUNTER — Ambulatory Visit (INDEPENDENT_AMBULATORY_CARE_PROVIDER_SITE_OTHER): Payer: Medicare Other | Admitting: Neurology

## 2015-09-24 ENCOUNTER — Encounter: Payer: Self-pay | Admitting: Neurology

## 2015-09-24 VITALS — BP 119/72 | HR 93 | Ht 70.0 in | Wt 281.6 lb

## 2015-09-24 DIAGNOSIS — I6522 Occlusion and stenosis of left carotid artery: Secondary | ICD-10-CM | POA: Diagnosis not present

## 2015-09-24 NOTE — Progress Notes (Signed)
[facility] [address] [place]. [place] [address] [phone]       OFFICE FOLLOW-UP NOTE  [name] Date of Birth:  Dec 03, 1940 Medical Record Number:  [id number]   HPI: Prior visit 02/12/2015: [name] is a 75 year old Caucasian male seen today for follow-up following hospital admission for TIA and stroke in July 2016. Jesse Wheeler is an 75 y.o. male history of diabetes mellitus, hypertension, hyperlipidemia, coronary artery disease and myocardial infarction, presenting with transient numbness and weakness involving his right upper and lower extremities as well as slurred speech and dysphagia. Symptoms lasted about 15-20 minutes then resolved. Onset was at 3 PM today. There's no previous history of stroke nor TIA. He's been taking aspirin 81 mg per day. CT scan of his head showed an area of hypodensity involving the right parietal region, suspicious for acute ischemic stroke. NIH stroke score at the time of this evaluation was 0. LSN: 3 PM on 12/14/2014 tPA Given: No: Deficits resolved.. CT scan of the head showed a hypodensity in the right parietal region worrisome for subacute to chronic infarct. 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 Transthoraxic echo showed normal ejection fraction. Carotid ultrasound showed no significant external cranial stenosis. M1 and A1 C was elevated at 7.2 LDL cholesterols 58 mg percent. Patient was started on dual antiplatelets therapy due to intracranial stenosis. He states his done well since discharge but subsequently was found to have atrial fibrillation and hence is anteverted therapy was changed to Xarelto which he seems to tolerating well without significant bleeding or bruising. He states his blood  pressure is under good control and today it is 105/69. He states his sugars are also well controlled. He has no complaints today. Update 09/23/2015 : He returns for follow-up after last visit 8 months ago. Continues to do well without recurrent stroke or TIAs. He was found to have paroxysmal to fibrillation on outpatient cardiac monitoring and started on Xarelto which is tolerating well with minor bruising but no bleeding episodes. States his blood pressure is well controlled and today it is 119/72. He is tolerating Zocor well without side effects but stated he last lipid profile showed LDL to be elevated at 124. He states his fasting sugars range between 110-140 and last for malignancy was 7.1. He is quite active and goes to the gym 3 times a week. He states is she did have edema and has not lost any weight. Did have a polysomnogram done 12 years ago which was negative for sleep apnea. He has not had any follow-up Doppler imaging of the brain or the neck since last year. ROS:   14 system review of systems is positive for  back pain only and all other systems negative  PMH:  Past Medical History  Diagnosis Date  . Coronary artery disease   . Acute inferior myocardial infarction ([place])     stenting in 2002 to the RCA  . S/P coronary angioplasty   . Hypertension     LCX  . Hyperlipidemia   . Diabetes mellitus     type II  . Hypercholesterolemia   . Right bundle branch block   . Obesity   . Prostatitis, chronic   . Peyronie's disease   . Tinnitus   .  Panic disorder   . Dupuytren's contracture of left hand   . Onychomycosis   . Bowen's disease   . History of colonic polyps 04/14/2014  . Actinic keratoses 08/09/2013    Overview:  IMPRESSION: Liquid nitrogen x2   . ONYCHOMYCOSIS 09/07/2006    Qualifier: Diagnosis of  By: Cletus Gash MD, [place]    . Stroke [facility])     Social History:  Social History   Social History  . Marital Status: Married    Spouse Name: Meryl Crutch  . Number of Children: 1  .  Years of Education: N/A   Occupational History  . Retired    Social History Main Topics  . Smoking status: Former Smoker    Types: Cigarettes    Quit date: 11/17/1981  . Smokeless tobacco: Never Used  . Alcohol Use: No  . Drug Use: No  . Sexual Activity: Not on file   Other Topics Concern  . Not on file   Social History Narrative   He is a retired Firefighter, he also served as a Psychiatrist.   Daily caffeine drinks    He is married and he has 1 son    Medications:   Current Outpatient Prescriptions on File Prior to Visit  Medication Sig Dispense Refill  . Blood Glucose Monitoring Suppl (FIFTY50 GLUCOSE METER 2.0) W/DEVICE KIT by Does not apply route.    . Cyanocobalamin (VITAMIN B-12 PO) Take 1 tablet by mouth daily.      Marland Kitchen doxazosin (CARDURA) 8 MG tablet Take 8 mg by mouth daily.    . ergocalciferol (VITAMIN D2) 50000 UNITS capsule Take 50,000 Units by mouth once a week.    . ezetimibe (ZETIA) 10 MG tablet Take 10 mg by mouth daily.      . ferrous sulfate 325 (65 FE) MG tablet Take 325 mg by mouth daily with breakfast.    . insulin glargine (LANTUS) 100 UNIT/ML injection Inject 75 Units into the skin at bedtime.     . isosorbide mononitrate (IMDUR) 30 MG 24 hr tablet TAKE 2 TABLETS PO DAILY (Patient taking differently: Take 60 mg by mouth daily. ) 60 tablet 9  . Liraglutide (VICTOZA [place]) Inject 1.8 mg into the skin daily.     Marland Kitchen lisinopril (PRINIVIL,ZESTRIL) 20 MG tablet Take 1 tablet (20 mg total) by mouth daily. 30 tablet 6  . metFORMIN (GLUCOPHAGE) 850 MG tablet Take 850 mg by mouth 3 (three) times daily.      . nitroGLYCERIN (NITROSTAT) 0.4 MG SL tablet Place 1 tablet (0.4 mg total) under the tongue every 5 (five) minutes as needed. 25 tablet 1  . ONETOUCH VERIO test strip   1  . Rivaroxaban (XARELTO) 15 MG TABS tablet Take 1 tablet by mouth once daily with a meal 90 tablet 3  . simvastatin (ZOCOR) 40 MG tablet Take  40 mg by mouth daily.     No current facility-administered medications on file prior to visit.    Allergies:  No Known Allergies  Physical Exam General: well developed, well nourished, seated, in no evident distress Head: head normocephalic and atraumatic.  Neck: supple with no carotid or supraclavicular bruits Cardiovascular: regular rate and rhythm, no murmurs Musculoskeletal: no deformity Skin:  no rash/petichiae Vascular:  Normal pulses all extremities Filed Vitals:   09/24/15 1614  BP: 119/72  Pulse: 93   Neurologic Exam Mental Status: Awake and fully alert. Oriented to place and time. Recent and remote memory  intact. Attention span, concentration and fund of knowledge appropriate. Mood and affect appropriate.  Cranial Nerves: Fundoscopic exam reveals sharp disc margins. Pupils equal, briskly reactive to light. Extraocular movements full without nystagmus. Visual fields full to confrontation. Hearing intact. Facial sensation intact. Face, tongue, palate moves normally and symmetrically.  Motor: Normal bulk and tone. Normal strength in all tested extremity muscles. Sensory.: intact to touch ,pinprick .position and vibratory sensation.  Coordination: Rapid alternating movements normal in all extremities. Finger-to-nose and heel-to-shin performed accurately bilaterally. Gait and Station: Arises from chair without difficulty. Stance is normal. Gait demonstrates normal stride length and balance . Able to heel, toe and tandem walk with  difficulty.  Reflexes: 1+ and symmetric. Toes downgoing.       ASSESSMENT: 75 year male with left hemispheric TIA in July 2016 secondary to symptomatic left cavernous carotid stenosis. He also had a silent right parietal infarct and subsequently was found to have paroxysmal atrial fibrillation on outpatient cardiac monitoring. Is doing well from neurovascular standpoint. Multiple vascular risk factors of atrial fibrillation, cavernous carotid stenosis,  diabetes, hypertension, hyperlipidemia and obesity.    PLAN: I had a long d/w patient and his wife about his recent TIA, left cavernous carotid stenosis, atrial fibrillation, risk for recurrent stroke/TIAs, personally independently reviewed imaging studies and stroke evaluation results and answered questions.Continue Xarelto (rivaroxaban) daily  for secondary stroke prevention and maintain strict control of hypertension with blood pressure goal below 130/90, diabetes with hemoglobin A1c goal below 6.5% and lipids with LDL cholesterol goal below 70 mg/dL. I also advised the patient to eat a healthy diet with plenty of whole grains, cereals, fruits and vegetables, exercise regularly and maintain ideal body weight Greater than 50% of time during this 25 minute visit was spent on counseling,explanation of diagnosis, planning of further management, discussion with patient and family and coordination of care.Followup in the future with me in 1 year or call earlier if necessary   Antony Contras, MD  [facility] [address] [place], [place] [address]  Phone [phone] Fax [phone]  Note: This document was prepared with digital dictation and possible smart phrase technology. Any transcriptional errors that result from this process are unintentional

## 2015-09-24 NOTE — Patient Instructions (Signed)
I had a long d/w patient and his wife about his recent TIA, left cavernous carotid stenosis, atrial fibrillation, risk for recurrent stroke/TIAs, personally independently reviewed imaging studies and stroke evaluation results and answered questions.Continue Xarelto (rivaroxaban) daily  for secondary stroke prevention and maintain strict control of hypertension with blood pressure goal below 130/90, diabetes with hemoglobin A1c goal below 6.5% and lipids with LDL cholesterol goal below 70 mg/dL. I also advised the patient to eat a healthy diet with plenty of whole grains, cereals, fruits and vegetables, exercise regularly and maintain ideal body weight Followup in the future with me in 1 year or call earlier if necessary  Stroke Prevention Some medical conditions and behaviors are associated with an increased chance of having a stroke. You may prevent a stroke by making healthy choices and managing medical conditions. HOW CAN I REDUCE MY RISK OF HAVING A STROKE?   Stay physically active. Get at least 30 minutes of activity on most or all days.  Do not smoke. It may also be helpful to avoid exposure to secondhand smoke.  Limit alcohol use. Moderate alcohol use is considered to be:  No more than 2 drinks per day for men.  No more than 1 drink per day for nonpregnant women.  Eat healthy foods. This involves:  Eating 5 or more servings of fruits and vegetables a day.  Making dietary changes that address high blood pressure (hypertension), high cholesterol, diabetes, or obesity.  Manage your cholesterol levels.  Making food choices that are high in fiber and low in saturated fat, trans fat, and cholesterol may control cholesterol levels.  Take any prescribed medicines to control cholesterol as directed by your health care provider.  Manage your diabetes.  Controlling your carbohydrate and sugar intake is recommended to manage diabetes.  Take any prescribed medicines to control diabetes as  directed by your health care provider.  Control your hypertension.  Making food choices that are low in salt (sodium), saturated fat, trans fat, and cholesterol is recommended to manage hypertension.  Ask your health care provider if you need treatment to lower your blood pressure. Take any prescribed medicines to control hypertension as directed by your health care provider.  If you are 55-47 years of age, have your blood pressure checked every 3-5 years. If you are 86 years of age or older, have your blood pressure checked every year.  Maintain a healthy weight.  Reducing calorie intake and making food choices that are low in sodium, saturated fat, trans fat, and cholesterol are recommended to manage weight.  Stop drug abuse.  Avoid taking birth control pills.  Talk to your health care provider about the risks of taking birth control pills if you are over 12 years old, smoke, get migraines, or have ever had a blood clot.  Get evaluated for sleep disorders (sleep apnea).  Talk to your health care provider about getting a sleep evaluation if you snore a lot or have excessive sleepiness.  Take medicines only as directed by your health care provider.  For some people, aspirin or blood thinners (anticoagulants) are helpful in reducing the risk of forming abnormal blood clots that can lead to stroke. If you have the irregular heart rhythm of atrial fibrillation, you should be on a blood thinner unless there is a good reason you cannot take them.  Understand all your medicine instructions.  Make sure that other conditions (such as anemia or atherosclerosis) are addressed. SEEK IMMEDIATE MEDICAL CARE IF:   You have  sudden weakness or numbness of the face, arm, or leg, especially on one side of the body.  Your face or eyelid droops to one side.  You have sudden confusion.  You have trouble speaking (aphasia) or understanding.  You have sudden trouble seeing in one or both  eyes.  You have sudden trouble walking.  You have dizziness.  You have a loss of balance or coordination.  You have a sudden, severe headache with no known cause.  You have new chest pain or an irregular heartbeat. Any of these symptoms may represent a serious problem that is an emergency. Do not wait to see if the symptoms will go away. Get medical help at once. Call your local emergency services (911 in [place] Do not drive yourself to the hospital.   This information is not intended to replace advice given to you by your health care provider. Make sure you discuss any questions you have with your health care provider.   Document Released: 06/09/2004 Document Revised: 05/23/2014 Document Reviewed: 11/02/2012 Elsevier Interactive Patient Education [employer].

## 2015-10-02 ENCOUNTER — Telehealth: Payer: Self-pay | Admitting: *Deleted

## 2015-10-02 NOTE — Telephone Encounter (Signed)
Caller MRS Shatswell/WIFE             CID [id number]  Patient Jesse Wheeler         Pt's Dr Leonie Man        Area Code 336 Phone# [place] [phone] [place] APPT ON 5/24                                     Disp:Y/N

## 2015-10-06 NOTE — Telephone Encounter (Signed)
Patient has been scheduled for [place] ultrasound.

## 2015-10-07 ENCOUNTER — Ambulatory Visit (INDEPENDENT_AMBULATORY_CARE_PROVIDER_SITE_OTHER): Payer: Medicare Other

## 2015-10-07 DIAGNOSIS — I6522 Occlusion and stenosis of left carotid artery: Secondary | ICD-10-CM

## 2015-10-26 NOTE — Telephone Encounter (Signed)
RN call patients wife about her husbands carotid ultrasound study. Rn stated per Dr.Sethi the study showed minor hardening of the arteries, but no significant blockage to worry about. Pts wife verbalized understanding.

## 2015-10-26 NOTE — Telephone Encounter (Signed)
LFt vm for patient to call back about carotid ultrasound.results.

## 2015-10-26 NOTE — Telephone Encounter (Signed)
Patient's wife is calling for results of Carotid ultrasound results.  Thanks!

## 2015-10-28 NOTE — Telephone Encounter (Signed)
Rn call patients wife Jesse Wheeler that her husbands TCD study showed no major blockages of any blood vessels in the brain. Only mild hardening of vessels which is age related. Pts wife verbalized understanding.

## 2015-10-28 NOTE — Telephone Encounter (Signed)
Kindly inform patient that TCD study shows no major blockages of any blood vessel in the brain. Only mild hardening of vessels which is age related expected finding

## 2016-09-21 ENCOUNTER — Ambulatory Visit: Payer: Medicare Other | Admitting: Cardiology

## 2016-09-21 NOTE — Progress Notes (Signed)
HPI The patient presents for followup of coronary disease.  I have not seen him since 2016.  At that time he had been hosptialized with a left hemisphere brain TIA.  He went home of ASA and Plavix.  However, he was noted on event monitor to have atrial fib briefly.  The rate was controlled and he does not feel this.  He came back to our warfarin clinic and was started on Xarelto.    Since I last saw him he has done well.  The patient denies any new symptoms such as chest discomfort, neck or arm discomfort. There has been no new shortness of breath, PND or orthopnea. There have been no reported palpitations, presyncope or syncope.  He does go to the gym a little.  He is not able to walk as much because of his back.    .No Known Allergies  Current Outpatient Prescriptions  Medication Sig Dispense Refill  . aspirin EC 81 MG tablet Take 81 mg by mouth.    . Blood Glucose Monitoring Suppl (FIFTY50 GLUCOSE METER 2.0) W/DEVICE KIT by Does not apply route.    . Cyanocobalamin (VITAMIN B-12 PO) Take 1 tablet by mouth daily.      Marland Kitchen doxazosin (CARDURA) 8 MG tablet Take 8 mg by mouth daily.    . ergocalciferol (VITAMIN D2) 50000 UNITS capsule Take 50,000 Units by mouth once a week.    . ezetimibe (ZETIA) 10 MG tablet Take 10 mg by mouth daily.      . ferrous sulfate 325 (65 FE) MG tablet Take 325 mg by mouth daily with breakfast.    . gabapentin (NEURONTIN) 300 MG capsule Take 300 mg by mouth 3 (three) times daily.    . insulin glargine (LANTUS) 100 UNIT/ML injection Inject 75 Units into the skin at bedtime.     . isosorbide mononitrate (IMDUR) 30 MG 24 hr tablet TAKE 2 TABLETS PO DAILY (Patient taking differently: Take 60 mg by mouth daily. ) 60 tablet 9  . liraglutide (VICTOZA) 18 MG/3ML SOPN Inject 1.8 mg into the skin daily.    Marland Kitchen lisinopril (PRINIVIL,ZESTRIL) 20 MG tablet Take 1 tablet (20 mg total) by mouth daily. 30 tablet 6  . metFORMIN (GLUCOPHAGE) 850 MG tablet Take 850 mg by mouth 3 (three)  times daily.      . nitroGLYCERIN (NITROSTAT) 0.4 MG SL tablet Place 1 tablet (0.4 mg total) under the tongue every 5 (five) minutes as needed. 25 tablet 1  . ONETOUCH VERIO test strip   1  . Rivaroxaban (XARELTO) 15 MG TABS tablet Take 1 tablet by mouth once daily with a meal 90 tablet 3  . simvastatin (ZOCOR) 40 MG tablet Take 40 mg by mouth daily.    . temazepam (RESTORIL) 15 MG capsule Take 15 mg by mouth at bedtime as needed for sleep.     No current facility-administered medications for this visit.     Past Medical History:  Diagnosis Date  . Actinic keratoses 08/09/2013   Overview:  IMPRESSION: Liquid nitrogen x2   . 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 Bowen's disease   . Coronary artery disease   . Diabetes mellitus    type II  . Dupuytren's contracture of left hand   . History of colonic polyps 04/14/2014  . Hypercholesterolemia   . Hyperlipidemia   . Hypertension    LCX  . Obesity   . Onychomycosis   .  ONYCHOMYCOSIS 09/07/2006   Qualifier: Diagnosis of  By: Cletus Gash MD, [place]    . Panic disorder   . Peyronie's disease   . Prostatitis, chronic   . Right bundle branch block   . S/P coronary angioplasty   . Stroke ([place])   . Tinnitus     Past Surgical History:  Procedure Laterality Date  . CATARACT EXTRACTION W/ INTRAOCULAR LENS  IMPLANT, BILATERAL    . CHOLECYSTECTOMY    . COLONOSCOPY  03/09/2004   Normal  . CORONARY ANGIOPLASTY  01/02/01   Acute inferior MI -- Stent placement in the proximal RCA (three separate stents totalling approximately 55 mm of stent with residual thrombus in the posterior descending artery) --  Relatively mild inferior basilar hypokinesia --Severe stenosis in a secondary marginal vessel of the left circumflex -- Minimal coronary atherosclerosis otherwise of the coronary system   . Nuclear stress test  Oct 2011    Dense inferior infarct; mild ischemia with dilated LV. EF was 39%. Study was felt to be  unchanged and he has been managed medically    ROS:   As stated in the HPI and negative for all other systems.   PHYSICAL EXAM BP (!) 114/54 (BP Location: Right Arm, Patient Position: Sitting, Cuff Size: Normal)   Pulse 83   Ht 5' 9.5" (1.765 m)   Wt 284 lb (128.8 kg)   BMI 41.34 kg/m  GENERAL:  Well appearing NECK:  No jugular venous distention, waveform within normal limits, carotid upstroke brisk and symmetric, no bruits, no thyromegaly LYMPHATICS:  No cervical, inguinal adenopathy LUNGS:  Clear to auscultation bilaterally BACK:  No CVA tenderness CHEST:  Unremarkable HEART:  PMI not displaced or sustained,S1 and S2 within normal limits, no S3, no S4, no clicks, no rubs, no murmurs ABD:  Flat, positive bowel sounds normal in frequency in pitch, no bruits, no rebound, no guarding, no midline pulsatile mass, no hepatomegaly, no splenomegaly, obese EXT:  2 plus pulses throughout, no edema, no cyanosis no clubbing  EKG:  Sinus rhythm, rate 83, atrial tachycardia short runs, right bundle branch block, nonspecific T-wave changes, QTC slightly prolonged. 09/22/2016    ASSESSMENT AND PLAN  CAD:  No new symptoms since his last stress test in 2016. Continue with risk reduction.   DM:  A1C 7.5.  I will defer to Bernerd Limbo, MD  ATRIAL FIB:  [name] has a CHA2DS2 - VASc score of 7. He tolerates anticoagulation.    MORBID OBESITY:    He is eating too many calories.  We talked about smaller portions and different choices.   CARDIOMYOPATHY:    His ejection fraction was slightly reduced but better than previous in 2016. No change in therapy is indicated.  DYSLIPIDEMIA:   LDL was 59 in Jan of this year.  HDL was 35.  He will continue the meds as listed.

## 2016-09-22 ENCOUNTER — Encounter: Payer: Self-pay | Admitting: Cardiology

## 2016-09-22 ENCOUNTER — Ambulatory Visit (INDEPENDENT_AMBULATORY_CARE_PROVIDER_SITE_OTHER): Payer: Medicare Other | Admitting: Cardiology

## 2016-09-22 VITALS — BP 114/54 | HR 83 | Ht 69.5 in | Wt 284.0 lb

## 2016-09-22 DIAGNOSIS — I10 Essential (primary) hypertension: Secondary | ICD-10-CM | POA: Diagnosis not present

## 2016-09-22 DIAGNOSIS — E118 Type 2 diabetes mellitus with unspecified complications: Secondary | ICD-10-CM | POA: Diagnosis not present

## 2016-09-22 DIAGNOSIS — I480 Paroxysmal atrial fibrillation: Secondary | ICD-10-CM | POA: Diagnosis not present

## 2016-09-22 DIAGNOSIS — I2510 Atherosclerotic heart disease of native coronary artery without angina pectoris: Secondary | ICD-10-CM | POA: Diagnosis not present

## 2016-09-22 NOTE — Patient Instructions (Signed)
Medication Instructions:  Continue current medications  Labwork: None ordered  Testing/Procedures:  None Ordered  Follow-Up: Your physician wants you to follow-up in: 1 Year. You will receive a reminder letter in the mail two months in advance. If you don't receive a letter, please call our office to schedule the follow-up appointment.   Any Other Special Instructions Will Be Listed Below (If Applicable).   If you need a refill on your cardiac medications before your next appointment, please call your pharmacy.

## 2016-10-14 ENCOUNTER — Encounter: Payer: Self-pay | Admitting: Nurse Practitioner

## 2016-10-14 ENCOUNTER — Other Ambulatory Visit (INDEPENDENT_AMBULATORY_CARE_PROVIDER_SITE_OTHER): Payer: Medicare Other

## 2016-10-14 ENCOUNTER — Telehealth: Payer: Self-pay

## 2016-10-14 ENCOUNTER — Ambulatory Visit (INDEPENDENT_AMBULATORY_CARE_PROVIDER_SITE_OTHER): Payer: Medicare Other | Admitting: Nurse Practitioner

## 2016-10-14 DIAGNOSIS — Z7901 Long term (current) use of anticoagulants: Secondary | ICD-10-CM | POA: Diagnosis not present

## 2016-10-14 DIAGNOSIS — D509 Iron deficiency anemia, unspecified: Secondary | ICD-10-CM | POA: Diagnosis not present

## 2016-10-14 LAB — CBC
HCT: 29.4 % — ABNORMAL LOW (ref 39.0–52.0)
Hemoglobin: 9.1 g/dL — ABNORMAL LOW (ref 13.0–17.0)
MCHC: 31.1 g/dL (ref 30.0–36.0)
MCV: 73 fl — ABNORMAL LOW (ref 78.0–100.0)
PLATELETS: 212 10*3/uL (ref 150.0–400.0)
RBC: 4.02 Mil/uL — ABNORMAL LOW (ref 4.22–5.81)
RDW: 18.7 % — ABNORMAL HIGH (ref 11.5–15.5)
WBC: 8.1 10*3/uL (ref 4.0–10.5)

## 2016-10-14 MED ORDER — OMEPRAZOLE 40 MG PO CPDR: 40.0000 mg | DELAYED_RELEASE_CAPSULE | Freq: Every day | ORAL | 6 refills | Status: DC

## 2016-10-14 NOTE — Patient Instructions (Addendum)
If you are age 75 or older, your body mass index should be between 23-30. Your Body mass index is 41.96 kg/m. If this is out of the aforementioned range listed, please consider follow up with your Primary Care Provider.  If you are age 78 or younger, your body mass index should be between 19-25. Your Body mass index is 41.96 kg/m. If this is out of the aformentioned range listed, please consider follow up with your Primary Care Provider.   You have been scheduled for an endoscopy. Please follow written instructions given to you at your visit today. If you use inhalers (even only as needed), please bring them with you on the day of your procedure. Your physician has requested that you go to www.startemmi.com and enter the access code given to you at your visit today. This web site gives a general overview about your procedure. However, you should still follow specific instructions given to you by our office regarding your preparation for the procedure.  You will be contacted by our office prior to your procedure for directions on holding your Plavix.  If you do not hear from our office 1 week prior to your scheduled procedure, please call [phone] to discuss.    Your physician has requested that you go to the basement for the following lab work before leaving today: CBC  Start Omeprazole 40 mg.  Continue Iron.  Thank you for choosing me and [facility].   Tye Savoy, NP

## 2016-10-14 NOTE — Telephone Encounter (Signed)
[facility] [address] [place], [place]  [address] Phone:  [phone]   Fax:  [phone]  10/14/2016   RE:      Jesse Wheeler DOB:   [birth year] MRN:   [id number]   Dear  Dr. Percival Spanish,    We have scheduled the above patient for an endoscopic procedure. Our records show that he is on anticoagulation therapy.   Please advise as to how long the patient may come off his therapy of Xarelto prior to the EGD procedure, which is scheduled for November 02, 2016.  Please fax back/ or route the completed form to [facility], [place] at [phone].   Sincerely,    Thurmon Fair, RMA

## 2016-10-14 NOTE — Progress Notes (Addendum)
HPI: Patient is a 76 year old male with DM 2, CAD/ history of stent. He is known to Dr. Carlean Purl from a screening colonoscopy November 2015 with removal of a 2 mm adenoma. Patient referred by PCP, Dr. Sherilyn Banker, for evaluation of new iron deficiency anemia. He had stroke versus TIA one year ago,  on Xarelto since. He takes a baby aspirin, no other NSAIDs. Hgb July 2016 was 11.1 with normal MCV. Routine labs in mid May of this year  showed a hemoglobin of 9.1. This was repeated a few days later and down to 8.8 /  Ferritin only 13. PCP started him on iron. Apparently omeprazole was called to the pharmacy but wife was not notified  that prescription was ready and therefore has not been started.. No overt bleeding. No bowel changes his stools are chronically loose on Meformin. No abdominal pain. No weight loss. He does not donate blood, denies epistaxis, no hematuria. He has felt a little winded lately. No chest pain. No dizziness.   Past Medical History:  Diagnosis Date  . Actinic keratoses 08/09/2013   Overview:  IMPRESSION: Liquid nitrogen x2   . 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 Bowen's disease   . Coronary artery disease   . Diabetes mellitus    type II  . Dupuytren's contracture of left hand   . History of colonic polyps 04/14/2014  . Hypercholesterolemia   . Hyperlipidemia   . Hypertension    LCX  . Obesity   . Onychomycosis   . ONYCHOMYCOSIS 09/07/2006   Qualifier: Diagnosis of  By: Cletus Gash MD, [place]    . Panic disorder   . Peyronie's disease   . Prostatitis, chronic   . Right bundle branch block   . S/P coronary angioplasty   . Stroke ([place])   . Tinnitus     Patient's surgical history, family medical history, social history, medications and allergies were all reviewed in Epic    Physical Exam: BP (!) 150/58   Pulse 100   Ht 5' 9.5" (1.765 m)   Wt 288 lb 4 oz (130.7 kg)   BMI 41.96 kg/m   GENERAL: Obese white male in  NAD PSYCH: :Pleasant, cooperative, normal affect EENT:  conjunctiva pink, mucous membranes moist, neck supple without masses CARDIAC:  RRR, no peripheral edema PULM: Normal respiratory effort, lungs CTA bilaterally, no wheezing ABDOMEN:  soft, obese, normal bowel sounds RECTAL: light [place] , HEME POSITIVE stool SKIN:  turgor, no lesions seen Musculoskeletal:   NEURO: Alert and oriented x 3, no focal neurologic deficits   ASSESSMENT and PLAN:  1. 30 old male with new iron deficiency anemia, Schweiger heme + stool on Xarelto. Baseline hemoglobin around 11, down to 8.8 mid May . No overt bleeding, Small adenoma removed on colonoscopy less than 3 years ago. He has no bowel changes, just chronic loose stool on metformin.  -Will schedule patient for EGD for further evaluation of iron deficiency anemia, if negative he may need a small bowel capsule study. Dr. Carlean Purl, patient's primary GI, may feel it necessary to repeat colonoscopy at some point but will defer that to him since it has been less than 3 years since his last one  -repeat CBC today.  -continue the iron  -He didn't get the PPI prescribed by PCP . I have advised them to get it and start ASAP  2. 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 He also  had a silent right parietal infarct. Found to have AFib, on Xarelto now. Hold Xarelto for 2 days before procedure - will instruct when and how to resume after procedure. Patient understands that there is a low but real risk of thrombus / stroke while off Xarelto. He agrees to proceed.  Will communicate by phone or EMR with patient's prescribing provider to confirm that holding Xarelto is reasonable in this case.  -Dr. Carlean Purl may opt to do the EGD on Xarelto, if this happens then we will contact the patient to let him know.   Tye Savoy , NP 10/14/2016, 1:42 PM   Cc: Bernerd Limbo, MD Agree with Ms. Guenther's assessment and plan. Gatha Mayer, MD, [place]

## 2016-10-16 NOTE — Telephone Encounter (Signed)
OK to stop Xarelto for two days if this is necessary for the procedure.  He is at slightly higher risk for embolic events off of anticoagulation with a high  CHA2DS2 - VASc score of 7.

## 2016-10-17 NOTE — Telephone Encounter (Signed)
Clearance for surgery

## 2016-10-18 NOTE — Telephone Encounter (Signed)
Spoke with patient this morning. Patient advised per Dr. Percival Spanish to hold Xarelto 2 days prior to procedure on 11/02/16.

## 2016-10-19 ENCOUNTER — Encounter: Payer: Self-pay | Admitting: Internal Medicine

## 2016-11-02 ENCOUNTER — Other Ambulatory Visit (INDEPENDENT_AMBULATORY_CARE_PROVIDER_SITE_OTHER): Payer: Medicare Other

## 2016-11-02 ENCOUNTER — Ambulatory Visit (AMBULATORY_SURGERY_CENTER): Payer: Medicare Other | Admitting: Internal Medicine

## 2016-11-02 ENCOUNTER — Encounter: Payer: Self-pay | Admitting: Internal Medicine

## 2016-11-02 VITALS — BP 125/76 | HR 61 | Temp 98.9°F | Resp 19 | Ht 69.0 in | Wt 288.0 lb

## 2016-11-02 DIAGNOSIS — K317 Polyp of stomach and duodenum: Secondary | ICD-10-CM | POA: Diagnosis not present

## 2016-11-02 DIAGNOSIS — K253 Acute gastric ulcer without hemorrhage or perforation: Secondary | ICD-10-CM | POA: Diagnosis not present

## 2016-11-02 DIAGNOSIS — K2950 Unspecified chronic gastritis without bleeding: Secondary | ICD-10-CM | POA: Diagnosis not present

## 2016-11-02 DIAGNOSIS — D509 Iron deficiency anemia, unspecified: Secondary | ICD-10-CM

## 2016-11-02 LAB — CBC WITH DIFFERENTIAL/PLATELET
BASOS ABS: 0.1 10*3/uL (ref 0.0–0.1)
Basophils Relative: 1 % (ref 0.0–3.0)
EOS ABS: 0.1 10*3/uL (ref 0.0–0.7)
Eosinophils Relative: 1.4 % (ref 0.0–5.0)
HCT: 26.8 % — ABNORMAL LOW (ref 39.0–52.0)
Hemoglobin: 8.4 g/dL — ABNORMAL LOW (ref 13.0–17.0)
LYMPHS ABS: 2.5 10*3/uL (ref 0.7–4.0)
Lymphocytes Relative: 36.3 % (ref 12.0–46.0)
MCHC: 31.5 g/dL (ref 30.0–36.0)
MCV: 73.1 fl — ABNORMAL LOW (ref 78.0–100.0)
MONO ABS: 0.6 10*3/uL (ref 0.1–1.0)
Monocytes Relative: 8.6 % (ref 3.0–12.0)
NEUTROS PCT: 52.7 % (ref 43.0–77.0)
Neutro Abs: 3.6 10*3/uL (ref 1.4–7.7)
Platelets: 195 10*3/uL (ref 150.0–400.0)
RBC: 3.67 Mil/uL — AB (ref 4.22–5.81)
RDW: 18.7 % — ABNORMAL HIGH (ref 11.5–15.5)
WBC: 6.8 10*3/uL (ref 4.0–10.5)

## 2016-11-02 LAB — VITAMIN B12: VITAMIN B 12: 1007 pg/mL — AB (ref 211–911)

## 2016-11-02 MED ORDER — SODIUM CHLORIDE 0.9 % IV SOLN: 500.0000 mL | INTRAVENOUS | Status: DC

## 2016-11-02 NOTE — Op Note (Signed)
[place] Patient Name: Jesse Wheeler Procedure Date: 11/02/2016 2:59 PM MRN: [id number] Endoscopist: Gatha Mayer , MD Age: 76 Referring MD:  Date of Birth: 02-Jan-1941 Gender: Male Account #: [id number] Procedure:                Upper GI endoscopy Indications:              Iron deficiency anemia secondary to chronic blood                            loss Medicines:                Propofol per Anesthesia, Monitored Anesthesia Care Procedure:                Pre-Anesthesia Assessment:                           - Prior to the procedure, a History and Physical                            was performed, and patient medications and                            allergies were reviewed. The patient's tolerance of                            previous anesthesia was also reviewed. The risks                            and benefits of the procedure and the sedation                            options and risks were discussed with the patient.                            All questions were answered, and informed consent                            was obtained. Prior Anticoagulants: The patient                            last took aspirin 1 day and Trental                            (pentoxifylline) 1 day prior to the procedure. ASA                            Grade Assessment: III - A patient with severe                            systemic disease. After reviewing the risks and                            benefits, the patient was deemed in satisfactory  condition to undergo the procedure.                           After obtaining informed consent, the endoscope was                            passed under direct vision. Throughout the                            procedure, the patient's blood pressure, pulse, and                            oxygen saturations were monitored continuously. The                            Endoscope was introduced through the mouth, and                             advanced to the second part of duodenum. The upper                            GI endoscopy was accomplished without difficulty.                            The patient tolerated the procedure well. Scope In: Scope Out: Findings:                 Two oozing superficial gastric ulcers were found in                            the gastric antrum and in the prepyloric region of                            the stomach. The largest lesion was 8 mm in largest                            dimension. Biopsies were taken with a cold forceps                            for histology. Verification of patient                            identification for the specimen was done. Estimated                            blood loss was minimal.                           A single 3 mm sessile polyp with no bleeding and no                            stigmata of recent bleeding was found in the  cardia. The polyp was removed with a cold biopsy                            forceps. Resection and retrieval were complete.                            Verification of patient identification for the                            specimen was done. Estimated blood loss was minimal.                           The exam was otherwise without abnormality.                           The cardia and gastric fundus were otherwise normal                            on retroflexion. Complications:            No immediate complications. Estimated Blood Loss:     Estimated blood loss was minimal. Impression:               - Oozing gastric ulcers. Biopsied. I believe these                            could explain his anemia - they appear to be                            healing since he has started omeprazole a few weeks                            ago                           - A single gastric polyp. Resected and retrieved.                           - The examination was otherwise  normal. Recommendation:           - Patient has a contact number available for                            emergencies. The signs and symptoms of potential                            delayed complications were discussed with the                            patient. Return to normal activities tomorrow.                            Written discharge instructions were provided to the  patient.                           - Continue present medications. Stay on omeprazole                            chronically - or another PPI while on ASA and                            Xarelto                           - Resume previous diet.                           - Check hemogram with white blood cell count and                            platelets today.                           - Check B12 level today. Gatha Mayer, MD 11/02/2016 3:35:47 PM This report has been signed electronically.

## 2016-11-02 NOTE — Progress Notes (Signed)
Called to room to assist during endoscopic procedure.  Patient ID and intended procedure confirmed with present staff. Received instructions for my participation in the procedure from the performing physician.  

## 2016-11-02 NOTE — Progress Notes (Signed)
Dental advisory given to [place] and oriented x3, pleased with MAC, report to RN Judson Roch

## 2016-11-02 NOTE — Patient Instructions (Addendum)
There were 2 small superficial ulcers in the stomach and 1 small stomach polyp. You could be leaking blood from the stomach. These should heal on the omeprazole and you should stay on this.  Will recheck hemoglobin today and also want to make sure B12 level is ok  I appreciate the opportunity to care for you. Gatha Mayer, MD, FACG YOU HAD AN ENDOSCOPIC PROCEDURE TODAY AT [facility]:   Refer to the procedure report that was given to you for any specific questions about what was found during the examination.  If the procedure report does not answer your questions, please call your gastroenterologist to clarify.  If you requested that your care partner not be given the details of your procedure findings, then the procedure report has been included in a sealed envelope for you to review at your convenience later.  YOU SHOULD EXPECT: Some feelings of bloating in the abdomen. Passage of more gas than usual.  Walking can help get rid of the air that was put into your GI tract during the procedure and reduce the bloating. If you had a lower endoscopy (such as a colonoscopy or flexible sigmoidoscopy) you may notice spotting of blood in your stool or on the toilet paper. If you underwent a bowel prep for your procedure, you may not have a normal bowel movement for a few days.  Please Note:  You might notice some irritation and congestion in your nose or some drainage.  This is from the oxygen used during your procedure.  There is no need for concern and it should clear up in a day or so.  SYMPTOMS TO REPORT IMMEDIATELY:   Following upper endoscopy (EGD)  Vomiting of blood or coffee ground material  New chest pain or pain under the shoulder blades  Painful or persistently difficult swallowing  New shortness of breath  Fever of 100F or higher  Black, tarry-looking stools  For urgent or emergent issues, a gastroenterologist can be reached at any hour by calling [phone]  [phone].   DIET:  We do recommend a small meal at first, but then you may proceed to your regular diet.  Drink plenty of fluids but you should avoid alcoholic beverages for 24 hours.  MEDICATIONS: Continue present medications. Stay on Omeprazole chronically - or another PPI (proton pump inhibitor) while on Aspirin and Xarelto. Resume Xarelto at prior dose this evening per your usual routine.  Report to laboratory to check hemogram with white blood cell count and platelets and a B12 level today.  ACTIVITY:  You should plan to take it easy for the rest of today and you should NOT DRIVE or use heavy machinery until tomorrow (because of the sedation medicines used during the test).    FOLLOW UP: Our staff will call the number listed on your records the next business day following your procedure to check on you and address any questions or concerns that you may have regarding the information given to you following your procedure. If we do not reach you, we will leave a message.  However, if you are feeling well and you are not experiencing any problems, there is no need to return our call.  We will assume that you have returned to your regular daily activities without incident.  If any biopsies were taken you will be contacted by phone or by letter within the next 1-3 weeks.  Please call us at [phone] if you have not heard about the biopsies  in 3 weeks.   Thank you for allowing [place] to provide for your healthcare needs today.  SIGNATURES/CONFIDENTIALITY: You and/or your care partner have signed paperwork which will be entered into your electronic medical record.  These signatures attest to the fact that that the information above on your After Visit Summary has been reviewed and is understood.  Full responsibility of the confidentiality of this discharge information lies with you and/or your care-partner.YOU HAD AN ENDOSCOPIC PROCEDURE TODAY AT [facility]:   Refer to the  procedure report that was given to you for any specific questions about what was found during the examination.  If the procedure report does not answer your questions, please call your gastroenterologist to clarify.  If you requested that your care partner not be given the details of your procedure findings, then the procedure report has been included in a sealed envelope for you to review at your convenience later.  YOU SHOULD EXPECT: Some feelings of bloating in the abdomen. Passage of more gas than usual.  Walking can help get rid of the air that was put into your GI tract during the procedure and reduce the bloating. If you had a lower endoscopy (such as a colonoscopy or flexible sigmoidoscopy) you may notice spotting of blood in your stool or on the toilet paper. If you underwent a bowel prep for your procedure, you may not have a normal bowel movement for a few days.  Please Note:  You might notice some irritation and congestion in your nose or some drainage.  This is from the oxygen used during your procedure.  There is no need for concern and it should clear up in a day or so.  SYMPTOMS TO REPORT IMMEDIATELY:   Following upper endoscopy (EGD)  Vomiting of blood or coffee ground material  New chest pain or pain under the shoulder blades  Painful or persistently difficult swallowing  New shortness of breath  Fever of 100F or higher  Black, tarry-looking stools  For urgent or emergent issues, a gastroenterologist can be reached at any hour by calling [phone].   DIET:  We do recommend a small meal at first, but then you may proceed to your regular diet.  Drink plenty of fluids but you should avoid alcoholic beverages for 24 hours.  ACTIVITY:  You should plan to take it easy for the rest of today and you should NOT DRIVE or use heavy machinery until tomorrow (because of the sedation medicines used during the test).    FOLLOW UP: Our staff will call the number listed on your  records the next business day following your procedure to check on you and address any questions or concerns that you may have regarding the information given to you following your procedure. If we do not reach you, we will leave a message.  However, if you are feeling well and you are not experiencing any problems, there is no need to return our call.  We will assume that you have returned to your regular daily activities without incident.  If any biopsies were taken you will be contacted by phone or by letter within the next 1-3 weeks.  Please call us at [phone] if you have not heard about the biopsies in 3 weeks.    SIGNATURES/CONFIDENTIALITY: You and/or your care partner have signed paperwork which will be entered into your electronic medical record.  These signatures attest to the fact that that the information above on your After Visit  Summary has been reviewed and is understood.  Full responsibility of the confidentiality of this discharge information lies with you and/or your care-partner. 

## 2016-11-03 ENCOUNTER — Telehealth: Payer: Self-pay | Admitting: *Deleted

## 2016-11-03 NOTE — Telephone Encounter (Signed)
  Follow up Call-  Call back number 11/02/2016 04/07/2014  Post procedure Call Back phone  # [phone]  Permission to leave phone message Yes Yes  Some recent data might be hidden     Patient questions:  Do you have a fever, pain , or abdominal swelling? No. Pain Score  0 *  Have you tolerated food without any problems? Yes.    Have you been able to return to your normal activities? Yes.    Do you have any questions about your discharge instructions: Diet   No. Medications  No. Follow up visit  No.  Do you have questions or concerns about your Care? No.  Actions: * If pain score is 4 or above: No action needed, pain <4.

## 2016-11-07 ENCOUNTER — Other Ambulatory Visit: Payer: Self-pay

## 2016-11-07 ENCOUNTER — Telehealth: Payer: Self-pay | Admitting: Internal Medicine

## 2016-11-07 DIAGNOSIS — D509 Iron deficiency anemia, unspecified: Secondary | ICD-10-CM

## 2016-11-07 NOTE — Telephone Encounter (Signed)
Message left for short stay to schedule Feraheme

## 2016-11-07 NOTE — Telephone Encounter (Signed)
Patient is asking about his lab results.  Please review and advise

## 2016-11-07 NOTE — Telephone Encounter (Signed)
Iron still low  Ask him to do feraheme x 2 and CBC ferritin 1 month after last I do not think the oral iron is working for him  B12 was high - ok

## 2016-11-08 NOTE — Telephone Encounter (Signed)
Patient's wife notified of the lab results and recommendations Patient is scheduled for 11/11/16 12:00 and 11/18/16 11:00, both at [facility].  CBC entered for 1 month

## 2016-11-10 ENCOUNTER — Encounter: Payer: Self-pay | Admitting: Internal Medicine

## 2016-11-10 NOTE — Progress Notes (Signed)
Gastritis + intestinal metaplasia Hyperplastic polyp No recall

## 2016-11-11 ENCOUNTER — Ambulatory Visit (HOSPITAL_COMMUNITY)
Admission: RE | Admit: 2016-11-11 | Discharge: 2016-11-11 | Disposition: A | Discharge status: Home / Self Care | Payer: Medicare Other | Source: Ambulatory Visit | Attending: Internal Medicine | Admitting: Internal Medicine

## 2016-11-11 DIAGNOSIS — D509 Iron deficiency anemia, unspecified: Secondary | ICD-10-CM | POA: Insufficient documentation

## 2016-11-11 MED ORDER — SODIUM CHLORIDE 0.9 % IV SOLN
510.0000 mg | INTRAVENOUS | Status: DC
  Administered 2016-11-11: 510 mg via INTRAVENOUS
  Filled 2016-11-11: qty 17

## 2016-11-11 NOTE — Discharge Instructions (Signed)

## 2016-11-18 ENCOUNTER — Ambulatory Visit (HOSPITAL_COMMUNITY)
Admission: RE | Admit: 2016-11-18 | Discharge: 2016-11-18 | Disposition: A | Discharge status: Home / Self Care | Payer: Medicare Other | Source: Ambulatory Visit | Attending: Internal Medicine | Admitting: Internal Medicine

## 2016-11-18 DIAGNOSIS — D509 Iron deficiency anemia, unspecified: Secondary | ICD-10-CM | POA: Insufficient documentation

## 2016-11-18 MED ORDER — SODIUM CHLORIDE 0.9 % IV SOLN
510.0000 mg | INTRAVENOUS | Status: AC
  Administered 2016-11-18: 11:00:00 510 mg via INTRAVENOUS
  Filled 2016-11-18: qty 17

## 2016-12-19 ENCOUNTER — Telehealth: Payer: Self-pay | Admitting: Internal Medicine

## 2016-12-19 ENCOUNTER — Other Ambulatory Visit: Payer: Self-pay

## 2016-12-19 DIAGNOSIS — D509 Iron deficiency anemia, unspecified: Secondary | ICD-10-CM

## 2016-12-19 NOTE — Telephone Encounter (Signed)
Advised per the telephone note from June, Dr Carlean Purl wants labs. Does not mention office visit.

## 2016-12-20 ENCOUNTER — Other Ambulatory Visit (INDEPENDENT_AMBULATORY_CARE_PROVIDER_SITE_OTHER): Payer: Medicare Other

## 2016-12-20 ENCOUNTER — Other Ambulatory Visit: Payer: Self-pay | Admitting: *Deleted

## 2016-12-20 DIAGNOSIS — D500 Iron deficiency anemia secondary to blood loss (chronic): Secondary | ICD-10-CM

## 2016-12-20 DIAGNOSIS — D509 Iron deficiency anemia, unspecified: Secondary | ICD-10-CM

## 2016-12-20 LAB — CBC WITH DIFFERENTIAL/PLATELET
BASOS PCT: 1 % (ref 0.0–3.0)
Basophils Absolute: 0.1 10*3/uL (ref 0.0–0.1)
EOS ABS: 0.2 10*3/uL (ref 0.0–0.7)
Eosinophils Relative: 2 % (ref 0.0–5.0)
HEMATOCRIT: 39.7 % (ref 39.0–52.0)
HEMOGLOBIN: 12.3 g/dL — AB (ref 13.0–17.0)
LYMPHS PCT: 25.7 % (ref 12.0–46.0)
Lymphs Abs: 2 10*3/uL (ref 0.7–4.0)
MCHC: 31.1 g/dL (ref 30.0–36.0)
MCV: 86.2 fl (ref 78.0–100.0)
Monocytes Absolute: 0.6 10*3/uL (ref 0.1–1.0)
Monocytes Relative: 7.4 % (ref 3.0–12.0)
Neutro Abs: 5 10*3/uL (ref 1.4–7.7)
Neutrophils Relative %: 63.9 % (ref 43.0–77.0)
Platelets: 166 10*3/uL (ref 150.0–400.0)
RBC: 4.6 Mil/uL (ref 4.22–5.81)
RDW: 29 % — AB (ref 11.5–15.5)
WBC: 7.8 10*3/uL (ref 4.0–10.5)

## 2016-12-20 LAB — FERRITIN: FERRITIN: 24.7 ng/mL (ref 22.0–322.0)

## 2016-12-20 NOTE — Progress Notes (Signed)
Labs in [facility] as per MD for 02/20/17. Left a message for patient to call back.

## 2016-12-20 NOTE — Progress Notes (Signed)
Patient given results and recommendations. He states he is still having stomach pain. He is taking Omeprazole BID x 2 months. Please,advise.

## 2016-12-20 NOTE — Progress Notes (Signed)
Let him know Hgb almost normal and ferritin is now normal but not by much.  Ask him to stay on the oral iron and repeat the same labs in 2 months dx chronic blood loss anemia

## 2016-12-22 ENCOUNTER — Other Ambulatory Visit: Payer: Self-pay

## 2016-12-22 DIAGNOSIS — D649 Anemia, unspecified: Secondary | ICD-10-CM

## 2016-12-23 ENCOUNTER — Encounter: Payer: Self-pay | Admitting: Internal Medicine

## 2016-12-23 NOTE — Telephone Encounter (Signed)
This encounter was created in error - please disregard.

## 2016-12-23 NOTE — Progress Notes (Signed)
Please call wife and add patient on for office visit Thurs 8/16

## 2016-12-29 ENCOUNTER — Encounter: Payer: Self-pay | Admitting: Internal Medicine

## 2016-12-29 ENCOUNTER — Ambulatory Visit (INDEPENDENT_AMBULATORY_CARE_PROVIDER_SITE_OTHER): Payer: Medicare Other | Admitting: Internal Medicine

## 2016-12-29 ENCOUNTER — Encounter (INDEPENDENT_AMBULATORY_CARE_PROVIDER_SITE_OTHER): Payer: Self-pay

## 2016-12-29 VITALS — BP 108/64 | HR 82 | Ht 69.0 in | Wt 286.0 lb

## 2016-12-29 DIAGNOSIS — R1032 Left lower quadrant pain: Secondary | ICD-10-CM | POA: Diagnosis not present

## 2016-12-29 DIAGNOSIS — Z7901 Long term (current) use of anticoagulants: Secondary | ICD-10-CM | POA: Diagnosis not present

## 2016-12-29 DIAGNOSIS — R10814 Left lower quadrant abdominal tenderness: Secondary | ICD-10-CM | POA: Diagnosis not present

## 2016-12-29 DIAGNOSIS — K259 Gastric ulcer, unspecified as acute or chronic, without hemorrhage or perforation: Secondary | ICD-10-CM | POA: Diagnosis not present

## 2016-12-29 DIAGNOSIS — D500 Iron deficiency anemia secondary to blood loss (chronic): Secondary | ICD-10-CM

## 2016-12-29 NOTE — Patient Instructions (Addendum)
  Please take the order for the CT scan to the [facility] and have them fax [place] the results, fax # [phone].   We will call you with results and plans.     I appreciate the opportunity to care for you. Silvano Rusk, MD, [facility]

## 2016-12-29 NOTE — Progress Notes (Signed)
Jesse Wheeler 76 y.o. [birth year] [id number]  Assessment & Plan:   Encounter Diagnoses  Name Primary?  Marland Kitchen LLQ pain Yes  . Left lower quadrant abdominal tenderness without rebound tenderness   . Multiple gastric ulcers   . Iron deficiency anemia due to chronic blood loss   . Chronic anticoagulation with Xarelto    Not sure what is causing pain - CT scan reasonable. May be musculoskeletal. He is worried about cancer.  Wants to do [facility] at [facility] so order given - e has to take to his PCP ? Capsule endoscopy to evaluate further pending CT ? Repeat colonoscopy - no major fidings and no cause of anemia 2015.  F/u labs He is responding to iron supplementation w/ feraheme   CC: Jesse Limbo, Jesse Wheeler  Subjective:   Chief Complaint: abdominal pain  HPI Still having intermittent dull LUQ pain - comes and goes - not related to movement or eating or stools. Feraheme infusions x 2 since last visit  Hgb better  O/W stable   No Known Allergies Current Meds  Medication Sig  . aspirin EC 81 MG tablet Take 81 mg by mouth.  . Blood Glucose Monitoring Suppl (FIFTY50 GLUCOSE METER 2.0) W/DEVICE KIT by Does not apply route.  . Cyanocobalamin (VITAMIN B-12 PO) Take 1 tablet by mouth daily.    Marland Kitchen doxazosin (CARDURA) 8 MG tablet Take 8 mg by mouth daily.  Marland Kitchen ezetimibe (ZETIA) 10 MG tablet Take 10 mg by mouth daily.    Marland Kitchen gabapentin (NEURONTIN) 300 MG capsule Take 300 mg by mouth 3 (three) times daily.  . insulin glargine (LANTUS) 100 UNIT/ML injection Inject 75 Units into the skin at bedtime.   . isosorbide mononitrate (IMDUR) 30 MG 24 hr tablet TAKE 2 TABLETS PO DAILY (Patient taking differently: Take 60 mg by mouth daily. )  . liraglutide (VICTOZA) 18 MG/3ML SOPN Inject 1.8 mg into the skin daily.  Marland Kitchen lisinopril (PRINIVIL,ZESTRIL) 20 MG tablet Take 1 tablet (20 mg total) by mouth daily.  . metFORMIN (GLUCOPHAGE) 500 MG tablet Take 500 mg by mouth 3 (three) times daily.  . nitroGLYCERIN (NITROSTAT) 0.4 MG  SL tablet Place 1 tablet (0.4 mg total) under the tongue every 5 (five) minutes as needed.  Marland Kitchen omeprazole (PRILOSEC) 40 MG capsule Take 1 capsule (40 mg total) by mouth daily.  Glory Rosebush VERIO test strip   . Rivaroxaban (XARELTO) 15 MG TABS tablet Take 1 tablet by mouth once daily with a meal  . simvastatin (ZOCOR) 40 MG tablet Take 40 mg by mouth daily.  . temazepam (RESTORIL) 15 MG capsule Take 15 mg by mouth at bedtime as needed for sleep.   Past Medical History:  Diagnosis Date  . Actinic keratoses 08/09/2013   Overview:  IMPRESSION: Liquid nitrogen x2   . 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 Bowen's disease   . Coronary artery disease   . Diabetes mellitus    type II  . Dupuytren's contracture of left hand   . History of colonic polyps 04/14/2014  . Hypercholesterolemia   . Hyperlipidemia   . Hypertension    LCX  . Obesity   . Onychomycosis   . ONYCHOMYCOSIS 09/07/2006   Qualifier: Diagnosis of  By: Cletus Gash Jesse Wheeler, [place]    . Panic disorder   . Peyronie's disease   . Prostatitis, chronic   . Right bundle branch block   . S/P coronary angioplasty   . Stroke (  [place])   . Tinnitus    Past Surgical History:  Procedure Laterality Date  . CATARACT EXTRACTION W/ INTRAOCULAR LENS  IMPLANT, BILATERAL    . CHOLECYSTECTOMY    . COLONOSCOPY  03/09/2004   Normal  . CORONARY ANGIOPLASTY  01/02/01   Acute inferior MI -- Stent placement in the proximal RCA (three separate stents totalling approximately 55 mm of stent with residual thrombus in the posterior descending artery) --  Relatively mild inferior basilar hypokinesia --Severe stenosis in a secondary marginal vessel of the left circumflex -- Minimal coronary atherosclerosis otherwise of the coronary system   . Nuclear stress test  Oct 2011    Dense inferior infarct; mild ischemia with dilated LV. EF was 39%. Study was felt to be unchanged and he has been managed medically   Social History    Social History  . Marital status: Married    Spouse name: Meryl Crutch  . Number of children: 1  . Years of education: N/A   Occupational History  . Retired    Social History Main Topics  . Smoking status: Former Smoker    Types: Cigarettes    Quit date: 11/17/1981  . Smokeless tobacco: Never Used  . Alcohol use No  . Drug use: No  . Sexual activity: Not on file   Other Topics Concern  . Not on file   Social History Narrative   He is a retired Firefighter, he also served as a Psychiatrist.   Daily caffeine drinks    He is married and he has 1 son   family history includes Diabetes in his sister; Heart attack in his father; Heart disease in his father; Transient ischemic attack in his sister.  Review of Systems As above  Objective:   Physical Exam BP 108/64 (BP Location: Left Arm, Patient Position: Sitting, Cuff Size: Normal)   Pulse 82   Ht '5\' 9"'  (1.753 m)   Wt 286 lb (129.7 kg)   BMI 42.23 kg/m  NAD obese Mildly tender LLQ no mass Ribs not tender

## 2017-01-24 ENCOUNTER — Encounter: Payer: Self-pay | Admitting: Neurology

## 2017-01-24 ENCOUNTER — Ambulatory Visit (INDEPENDENT_AMBULATORY_CARE_PROVIDER_SITE_OTHER): Payer: Medicare Other | Admitting: Neurology

## 2017-01-24 VITALS — BP 107/61 | HR 102 | Ht 69.0 in | Wt 282.8 lb

## 2017-01-24 DIAGNOSIS — I6990 Unspecified sequelae of unspecified cerebrovascular disease: Secondary | ICD-10-CM | POA: Diagnosis not present

## 2017-01-24 NOTE — Patient Instructions (Signed)
I had a long d/w patient and his wife about his remote TIA, atrial fibrillation, risk for recurrent stroke/TIAs, personally independently reviewed imaging studies and stroke evaluation results and answered questions.Continue Xarelto (rivaroxaban) daily  for secondary stroke prevention and maintain strict control of hypertension with blood pressure goal below 130/90, diabetes with hemoglobin A1c goal below 6.5% and lipids with LDL cholesterol goal below 70 mg/dL. I also advised the patient to eat a healthy diet with plenty of whole grains, cereals, fruits and vegetables, exercise regularly and maintain ideal body weight  check follow-up screening carotid ultrasound study. No routine scheduled follow-up appointment is necessary as he has been free of TIA and stroke symptoms were more than 2 years. He may be referred back forr follow-up in the future only as necessary

## 2017-01-24 NOTE — Progress Notes (Signed)
[facility] [address] [place]. [place] [address] [phone]       OFFICE FOLLOW-UP NOTE  [name] Date of Birth:  11/01/1940 Medical Record Number:  [id number]   HPI: Prior visit 02/12/2015: [name] is a 76 year old Caucasian male seen today for follow-up following hospital admission for TIA and stroke in July 2016. Jesse Wheeler is an 76 y.o. male history of diabetes mellitus, hypertension, hyperlipidemia, coronary artery disease and myocardial infarction, presenting with transient numbness and weakness involving his right upper and lower extremities as well as slurred speech and dysphagia. Symptoms lasted about 15-20 minutes then resolved. Onset was at 3 PM today. There's no previous history of stroke nor TIA. He's been taking aspirin 81 mg per day. CT scan of his head showed an area of hypodensity involving the right parietal region, suspicious for acute ischemic stroke. NIH stroke score at the time of this evaluation was 0. LSN: 3 PM on 12/14/2014 tPA Given: No: Deficits resolved.. CT scan of the head showed a hypodensity in the right parietal region worrisome for subacute to chronic infarct. 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 Transthoraxic echo showed normal ejection fraction. Carotid ultrasound showed no significant external cranial stenosis. M1 and A1 C was elevated at 7.2 LDL cholesterols 58 mg percent. Patient was started on dual antiplatelets therapy due to intracranial stenosis. He states his done well since discharge but subsequently was found to have atrial fibrillation and hence is anteverted therapy was changed to Xarelto which he seems to tolerating well without significant bleeding or bruising. He states his blood  pressure is under good control and today it is 105/69. He states his sugars are also well controlled. He has no complaints today. Update 09/23/2015 : He returns for follow-up after last visit 8 months ago. Continues to do well without recurrent stroke or TIAs. He was found to have paroxysmal to fibrillation on outpatient cardiac monitoring and started on Xarelto which is tolerating well with minor bruising but no bleeding episodes. States his blood pressure is well controlled and today it is 119/72. He is tolerating Zocor well without side effects but stated he last lipid profile showed LDL to be elevated at 124. He states his fasting sugars range between 110-140 and last for malignancy was 7.1. He is quite active and goes to the gym 3 times a week. He states is she did have edema and has not lost any weight. Did have a polysomnogram done 12 years ago which was negative for sleep apnea. He has not had any follow-up Doppler imaging of the brain or the neck since last year. Update 01/24/2017 ; he returns for follow-up after last visit a year ago. He continues to do well without recurrent stroke or TIA symptoms now for more than 2 years. He had a follow-up carotid ultrasound study and transcranial Doppler studies done on 10/16/15 that showed no significant extra can stenosis and only mild increase in pulsatility indexes without hemodynamically significant stenosis. He remains on Xarelto which is tolerating well without bleeding but does bruise easily. His blood pressure well controlled and today it is [id number]. His starting Zocor well without muscle aches and pain. Last total cholesterol 124 mg percent. His diabetes also remains quite well controlled and last hemoglobin  A1c checked 3 weeks ago was 6.5. He has no new neurological complaints. ROS:   14 system review of systems is positive for  wheezing,   and numbness in the feet only and all other systems negative  PMH:  Past Medical History:  Diagnosis Date  .  Actinic keratoses 08/09/2013   Overview:  IMPRESSION: Liquid nitrogen x2   . Acute inferior myocardial infarction ([place])    stenting in 2002 to the RCA  . Bowen's disease   . Coronary artery disease   . Diabetes mellitus    type II  . Dupuytren's contracture of left hand   . History of colonic polyps 04/14/2014  . Hypercholesterolemia   . Hyperlipidemia   . Hypertension    LCX  . Obesity   . Onychomycosis   . ONYCHOMYCOSIS 09/07/2006   Qualifier: Diagnosis of  By: Cletus Gash MD, [place]    . Panic disorder   . Peyronie's disease   . Prostatitis, chronic   . Right bundle branch block   . S/P coronary angioplasty   . Stroke ([place])   . Tinnitus     Social History:  Social History   Social History  . Marital status: Married    Spouse name: Meryl Crutch  . Number of children: 1  . Years of education: N/A   Occupational History  . Retired    Social History Main Topics  . Smoking status: Former Smoker    Types: Cigarettes    Quit date: 11/17/1981  . Smokeless tobacco: Never Used  . Alcohol use No  . Drug use: No  . Sexual activity: Not on file   Other Topics Concern  . Not on file   Social History Narrative   He is a retired Firefighter, he also served as a Psychiatrist.   Daily caffeine drinks    He is married and he has 1 son    Medications:   Current Outpatient Prescriptions on File Prior to Visit  Medication Sig Dispense Refill  . aspirin EC 81 MG tablet Take 81 mg by mouth.    . Blood Glucose Monitoring Suppl (FIFTY50 GLUCOSE METER 2.0) W/DEVICE KIT by Does not apply route.    . Cyanocobalamin (VITAMIN B-12 PO) Take 1 tablet by mouth daily.      Marland Kitchen doxazosin (CARDURA) 8 MG tablet Take 8 mg by mouth daily.    Marland Kitchen ezetimibe (ZETIA) 10 MG tablet Take 10 mg by mouth daily.      . ferrous sulfate 325 (65 FE) MG tablet Take 325 mg by mouth daily with breakfast.    . gabapentin (NEURONTIN) 300 MG capsule Take 300 mg by mouth  3 (three) times daily.    . insulin glargine (LANTUS) 100 UNIT/ML injection Inject 75 Units into the skin at bedtime.     . isosorbide mononitrate (IMDUR) 30 MG 24 hr tablet TAKE 2 TABLETS PO DAILY (Patient taking differently: Take 60 mg by mouth daily. ) 60 tablet 9  . liraglutide (VICTOZA) 18 MG/3ML SOPN Inject 1.8 mg into the skin daily.    Marland Kitchen lisinopril (PRINIVIL,ZESTRIL) 20 MG tablet Take 1 tablet (20 mg total) by mouth daily. 30 tablet 6  . metFORMIN (GLUCOPHAGE) 500 MG tablet Take 500 mg by mouth 3 (three) times daily.    . nitroGLYCERIN (NITROSTAT) 0.4 MG SL tablet Place 1 tablet (0.4 mg total) under the tongue every 5 (five) minutes as needed. 25 tablet 1  . omeprazole (PRILOSEC)  40 MG capsule Take 1 capsule (40 mg total) by mouth daily. 30 capsule 6  . ONETOUCH VERIO test strip   1  . Rivaroxaban (XARELTO) 15 MG TABS tablet Take 1 tablet by mouth once daily with a meal 90 tablet 3  . simvastatin (ZOCOR) 40 MG tablet Take 40 mg by mouth daily.    . temazepam (RESTORIL) 15 MG capsule Take 15 mg by mouth at bedtime as needed for sleep.     No current facility-administered medications on file prior to visit.     Allergies:  No Known Allergies  Physical Exam General: Obese elderly Caucasian male seated, in no evident distress Head: head normocephalic and atraumatic.  Neck: supple with no carotid or supraclavicular bruits Cardiovascular: regular rate and rhythm, no murmurs Musculoskeletal: no deformity Skin:  no rash/petichiae Vascular:  Normal pulses all extremities Vitals:   01/24/17 0957  BP: 107/61  Pulse: (!) 102   Neurologic Exam Mental Status: Awake and fully alert. Oriented to place and time. Recent and remote memory intact. Attention span, concentration and fund of knowledge appropriate. Mood and affect appropriate.  Cranial Nerves: Fundoscopic exam Not done. Pupils equal, briskly reactive to light. Extraocular movements full without nystagmus. Visual fields full to  confrontation. Hearing intact. Facial sensation intact. Face, tongue, palate moves normally and symmetrically.  Motor: Normal bulk and tone. Normal strength in all tested extremity muscles. Sensory.: Diminished touch ,pinprick .position and vibratory sensation from ankle down bilaterally.  Coordination: Rapid alternating movements normal in all extremities. Finger-to-nose and heel-to-shin performed accurately bilaterally. Gait and Station: Arises from chair without difficulty. Stance is normal. Gait demonstrates normal stride length and balance . Unable ble to heel, toe and tandem walk with  difficulty.  Reflexes: 1+ and symmetric except both ankle jerks are depressed. Toes downgoing.       ASSESSMENT: 76 year male with left hemispheric TIA in July 2016 secondary to symptomatic left cavernous carotid stenosis. He also had a silent right parietal infarct and subsequently was found to have paroxysmal atrial fibrillation on outpatient cardiac monitoring. Is doing well from neurovascular standpoint. Multiple vascular risk factors of atrial fibrillation, cavernous carotid stenosis, diabetes, hypertension, hyperlipidemia and obesity.    PLAN: I had a long d/w patient and his wife about his remote TIA, atrial fibrillation, risk for recurrent stroke/TIAs, personally independently reviewed imaging studies and stroke evaluation results and answered questions.Continue Xarelto (rivaroxaban) daily  for secondary stroke prevention and maintain strict control of hypertension with blood pressure goal below 130/90, diabetes with hemoglobin A1c goal below 6.5% and lipids with LDL cholesterol goal below 70 mg/dL. I also advised the patient to eat a healthy diet with plenty of whole grains, cereals, fruits and vegetables, exercise regularly and maintain ideal body weight  check follow-up screening carotid ultrasound study. No routine scheduled follow-up appointment is necessary as he has been free of TIA and stroke  symptoms were more than 2 years. He may be referred back forr follow-up in the future only as necessary.Greater than 50% of time during this 25 minute visit was spent on counseling,explanation of diagnosis, planning of further management, discussion with patient and family and coordination of care.Followup in the future with me in 1 year or call earlier if necessary   Antony Contras, MD  [facility] [address] [place], [place] [address]  Phone [phone] Fax [phone]  Note: This document was prepared with digital dictation and possible smart phrase technology. Any transcriptional errors that result from this  process are unintentional

## 2017-01-31 ENCOUNTER — Ambulatory Visit (HOSPITAL_COMMUNITY)
Admission: RE | Admit: 2017-01-31 | Discharge: 2017-01-31 | Disposition: A | Discharge status: Home / Self Care | Payer: Medicare Other | Source: Ambulatory Visit | Attending: Neurology | Admitting: Neurology

## 2017-01-31 DIAGNOSIS — I6529 Occlusion and stenosis of unspecified carotid artery: Secondary | ICD-10-CM | POA: Insufficient documentation

## 2017-01-31 DIAGNOSIS — I6990 Unspecified sequelae of unspecified cerebrovascular disease: Secondary | ICD-10-CM | POA: Diagnosis present

## 2017-01-31 NOTE — Progress Notes (Signed)
VASCULAR LAB PRELIMINARY  PRELIMINARY  PRELIMINARY  PRELIMINARY  Carotid duplex completed.    Preliminary report:  1-39% ICA stenosis.  Vertebral artery flow is antegrade.   Catarino Vold, RVT 01/31/2017, 11:17 AM

## 2017-02-01 LAB — VAS US CAROTID
LCCAPDIAS: 12 cm/s
LEFT ECA DIAS: -8 cm/s
LEFT VERTEBRAL DIAS: -7 cm/s
LICADDIAS: -16 cm/s
LICAPDIAS: -20 cm/s
LICAPSYS: -51 cm/s
Left CCA dist dias: -12 cm/s
Left CCA dist sys: -58 cm/s
Left CCA prox sys: 79 cm/s
Left ICA dist sys: -53 cm/s
RIGHT ECA DIAS: -9 cm/s
RIGHT VERTEBRAL DIAS: -16 cm/s
Right CCA prox dias: 16 cm/s
Right CCA prox sys: 70 cm/s
Right cca dist sys: -62 cm/s

## 2017-02-07 ENCOUNTER — Telehealth: Payer: Self-pay

## 2017-02-07 NOTE — Telephone Encounter (Signed)
-----   Message from Garvin Fila, MD sent at 02/01/2017  6:38 PM EDT ----- Jesse Wheeler let patient know that carotid doppler study was normal

## 2017-02-07 NOTE — Telephone Encounter (Signed)
Rn call patients wife that the carotid doppler was normal. Pts wife on dpr verbalized understanding. ------

## 2017-02-16 ENCOUNTER — Other Ambulatory Visit (INDEPENDENT_AMBULATORY_CARE_PROVIDER_SITE_OTHER): Payer: Medicare Other

## 2017-02-16 DIAGNOSIS — D649 Anemia, unspecified: Secondary | ICD-10-CM

## 2017-02-16 DIAGNOSIS — D500 Iron deficiency anemia secondary to blood loss (chronic): Secondary | ICD-10-CM | POA: Diagnosis not present

## 2017-02-16 LAB — CBC WITH DIFFERENTIAL/PLATELET
BASOS PCT: 0.3 % (ref 0.0–3.0)
Basophils Absolute: 0 10*3/uL (ref 0.0–0.1)
EOS ABS: 0.1 10*3/uL (ref 0.0–0.7)
EOS PCT: 1.8 % (ref 0.0–5.0)
HEMATOCRIT: 37.5 % — AB (ref 39.0–52.0)
HEMOGLOBIN: 12.1 g/dL — AB (ref 13.0–17.0)
LYMPHS PCT: 30.5 % (ref 12.0–46.0)
Lymphs Abs: 1.9 10*3/uL (ref 0.7–4.0)
MCHC: 32.2 g/dL (ref 30.0–36.0)
MCV: 89.6 fl (ref 78.0–100.0)
MONOS PCT: 9.3 % (ref 3.0–12.0)
Monocytes Absolute: 0.6 10*3/uL (ref 0.1–1.0)
NEUTROS ABS: 3.7 10*3/uL (ref 1.4–7.7)
Neutrophils Relative %: 58.1 % (ref 43.0–77.0)
PLATELETS: 163 10*3/uL (ref 150.0–400.0)
RBC: 4.19 Mil/uL — ABNORMAL LOW (ref 4.22–5.81)
RDW: 20.4 % — AB (ref 11.5–15.5)
WBC: 6.3 10*3/uL (ref 4.0–10.5)

## 2017-02-16 LAB — FERRITIN: Ferritin: 15.8 ng/mL — ABNORMAL LOW (ref 22.0–322.0)

## 2017-02-17 ENCOUNTER — Telehealth: Payer: Self-pay | Admitting: Internal Medicine

## 2017-02-17 ENCOUNTER — Other Ambulatory Visit: Payer: Self-pay

## 2017-02-17 DIAGNOSIS — D508 Other iron deficiency anemias: Secondary | ICD-10-CM

## 2017-02-17 NOTE — Progress Notes (Signed)
Iron is low again feraheme x 2 CBC and ferritin 1 month after that  See me after the labs are done (sometime after - can be a few weeks)

## 2017-02-17 NOTE — Telephone Encounter (Signed)
Made in error. Nurse spoke with pt about lab results.

## 2017-02-20 NOTE — Telephone Encounter (Signed)
All questions answered about recent lab work and feraheme infusions next week.

## 2017-02-27 ENCOUNTER — Ambulatory Visit (HOSPITAL_COMMUNITY)
Admission: RE | Admit: 2017-02-27 | Discharge: 2017-02-27 | Disposition: A | Discharge status: Home / Self Care | Payer: Medicare Other | Source: Ambulatory Visit | Attending: Internal Medicine | Admitting: Internal Medicine

## 2017-02-27 DIAGNOSIS — D508 Other iron deficiency anemias: Secondary | ICD-10-CM | POA: Insufficient documentation

## 2017-02-27 MED ORDER — SODIUM CHLORIDE 0.9 % IV SOLN
510.0000 mg | INTRAVENOUS | Status: DC
Start: 2017-02-27 — End: 2017-02-28
  Administered 2017-02-27: 510 mg via INTRAVENOUS
  Filled 2017-02-27: qty 17

## 2017-03-06 ENCOUNTER — Ambulatory Visit (HOSPITAL_COMMUNITY)
Admission: RE | Admit: 2017-03-06 | Discharge: 2017-03-06 | Disposition: A | Discharge status: Home / Self Care | Payer: Medicare Other | Source: Ambulatory Visit | Attending: Internal Medicine | Admitting: Internal Medicine

## 2017-03-06 DIAGNOSIS — D508 Other iron deficiency anemias: Secondary | ICD-10-CM | POA: Diagnosis present

## 2017-03-06 MED ORDER — FERUMOXYTOL INJECTION 510 MG/17 ML
510.0000 mg | INTRAVENOUS | Status: DC
Start: 2017-03-06 — End: 2017-03-07
  Administered 2017-03-06: 510 mg via INTRAVENOUS
  Filled 2017-03-06: qty 17

## 2017-04-10 ENCOUNTER — Telehealth: Payer: Self-pay | Admitting: Internal Medicine

## 2017-04-10 NOTE — Telephone Encounter (Signed)
Patient is due for labs 6 weeks after last Feraheme infusion.  Last infusion was 03/06/17.  Wife notified that he is due for labs next week and they can come at their convenience.

## 2017-04-10 NOTE — Telephone Encounter (Signed)
Patient to have follow up

## 2017-04-17 ENCOUNTER — Other Ambulatory Visit (INDEPENDENT_AMBULATORY_CARE_PROVIDER_SITE_OTHER): Payer: Medicare Other

## 2017-04-17 DIAGNOSIS — D508 Other iron deficiency anemias: Secondary | ICD-10-CM | POA: Diagnosis not present

## 2017-04-17 LAB — CBC WITH DIFFERENTIAL/PLATELET
BASOS ABS: 0.1 10*3/uL (ref 0.0–0.1)
Basophils Relative: 0.6 % (ref 0.0–3.0)
Eosinophils Absolute: 0.1 10*3/uL (ref 0.0–0.7)
Eosinophils Relative: 0.9 % (ref 0.0–5.0)
HEMATOCRIT: 43.5 % (ref 39.0–52.0)
Hemoglobin: 14.2 g/dL (ref 13.0–17.0)
LYMPHS PCT: 20.8 % (ref 12.0–46.0)
Lymphs Abs: 1.8 10*3/uL (ref 0.7–4.0)
MCHC: 32.8 g/dL (ref 30.0–36.0)
MCV: 94.3 fl (ref 78.0–100.0)
MONOS PCT: 10.7 % (ref 3.0–12.0)
Monocytes Absolute: 0.9 10*3/uL (ref 0.1–1.0)
Neutro Abs: 5.7 10*3/uL (ref 1.4–7.7)
Neutrophils Relative %: 67 % (ref 43.0–77.0)
Platelets: 201 10*3/uL (ref 150.0–400.0)
RBC: 4.61 Mil/uL (ref 4.22–5.81)
RDW: 17.3 % — ABNORMAL HIGH (ref 11.5–15.5)
WBC: 8.5 10*3/uL (ref 4.0–10.5)

## 2017-04-17 LAB — FERRITIN: FERRITIN: 120.1 ng/mL (ref 22.0–322.0)

## 2017-04-17 NOTE — Progress Notes (Signed)
Iron and Hgb are NL  I need to see him next available to discuss any further testing

## 2017-06-12 ENCOUNTER — Ambulatory Visit: Payer: Medicare Other | Admitting: Internal Medicine

## 2017-07-18 ENCOUNTER — Telehealth: Payer: Self-pay | Admitting: Adult Health

## 2017-07-18 NOTE — Telephone Encounter (Signed)
Pt's wife called to scheduled f/u. appt has been scheduled for 3/7.  FYI

## 2017-07-20 ENCOUNTER — Ambulatory Visit: Payer: Medicare Other | Admitting: Adult Health

## 2017-07-20 ENCOUNTER — Encounter: Payer: Self-pay | Admitting: Adult Health

## 2017-07-20 VITALS — BP 100/62 | HR 72 | Wt 290.2 lb

## 2017-07-20 DIAGNOSIS — M792 Neuralgia and neuritis, unspecified: Secondary | ICD-10-CM | POA: Diagnosis not present

## 2017-07-20 DIAGNOSIS — Z8673 Personal history of transient ischemic attack (TIA), and cerebral infarction without residual deficits: Secondary | ICD-10-CM | POA: Diagnosis not present

## 2017-07-20 DIAGNOSIS — E1142 Type 2 diabetes mellitus with diabetic polyneuropathy: Secondary | ICD-10-CM

## 2017-07-20 DIAGNOSIS — Z794 Long term (current) use of insulin: Secondary | ICD-10-CM | POA: Diagnosis not present

## 2017-07-20 NOTE — Progress Notes (Signed)
[facility] [address] [place]. [place] [address] [phone]       OFFICE FOLLOW-UP NOTE  Jesse Wheeler Date of Birth:  1941-03-18 Medical Record Number:  [id number]   HPI: Prior visit 02/12/2015: Jesse Wheeler is a 77 year old Caucasian male seen today for follow-up following hospital admission for TIA and stroke in July 2016. Jesse Wheeler is an 77 y.o. male history of diabetes mellitus, hypertension, hyperlipidemia, coronary artery disease and myocardial infarction, presenting with transient numbness and weakness involving his right upper and lower extremities as well as slurred speech and dysphagia. Symptoms lasted about 15-20 minutes then resolved. Onset was at 3 PM today. There's no previous history of stroke nor TIA. He's been taking aspirin 81 mg per day. CT scan of his head showed an area of hypodensity involving the right parietal region, suspicious for acute ischemic stroke. NIH stroke score at the time of this evaluation was 0. LSN: 3 PM on 12/14/2014 tPA Given: No: Deficits resolved.. CT scan of the head showed a hypodensity in the right parietal region worrisome for subacute to chronic infarct. 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 Transthoraxic echo showed normal ejection fraction. Carotid ultrasound showed no significant external cranial stenosis. M1 and A1 C was elevated at 7.2 LDL cholesterols 58 mg percent. Patient was started on dual antiplatelets therapy due to intracranial stenosis. He states his done well since discharge but subsequently was found to have atrial fibrillation and hence is anteverted therapy was changed to Xarelto which he seems to tolerating well without significant bleeding or bruising. He states his blood  pressure is under good control and today it is 105/69. He states his sugars are also well controlled. He has no complaints today. Update 09/23/2015 : He returns for follow-up after last visit 8 months ago. Continues to do well without recurrent stroke or TIAs. He was found to have paroxysmal to fibrillation on outpatient cardiac monitoring and started on Xarelto which is tolerating well with minor bruising but no bleeding episodes. States his blood pressure is well controlled and today it is 119/72. He is tolerating Zocor well without side effects but stated he last lipid profile showed LDL to be elevated at 124. He states his fasting sugars range between 110-140 and last for malignancy was 7.1. He is quite active and goes to the gym 3 times a week. He states is she did have edema and has not lost any weight. Did have a polysomnogram done 12 years ago which was negative for sleep apnea. He has not had any follow-up Doppler imaging of the brain or the neck since last year. Update 01/24/2017 ; he returns for follow-up after last visit a year ago. He continues to do well without recurrent stroke or TIA symptoms now for more than 2 years. He had a follow-up carotid ultrasound study and transcranial Doppler studies done on 10/16/15 that showed no significant extra can stenosis and only mild increase in pulsatility indexes without hemodynamically significant stenosis. He remains on Xarelto which is tolerating well without bleeding but does bruise easily. His blood pressure well controlled and today it is [id number]. His starting Zocor well without muscle aches and pain. Last total cholesterol 124 mg percent. His diabetes also remains quite well controlled and last  hemoglobin A1c checked 3 weeks ago was 6.5. He has no new neurological complaints.  UPDATE 07/20/17: Patient requested an appointment today for numbness/tingling that radiates from right clavicle down into scapula.  Denies numbness or tingling going up into the neck  or down the arm.  Denies pain.  Denies recent trauma such as falling, lifting a heavy object or straining his neck.  Denies pain with palpation of area.  Denies increased numbness tingling or pain with neck range of motion or shoulder range of motion.  Patient states this has been occurring for the past week and a half.  He denies ever having this symptom before.  He has not sought out medical attention prior to this appointment for this complaint.  He states the numbness is worse in the morning when he first wakes up and slowly subsides throughout the day.  He states this numbness can come and go intermittently that lasts only a short while.  Patient does have a stroke and TIA history with the most recent occurring in 2016 with symptoms of right upper and lower extremity numbness, slurred speech and dysphasia.  MRI negative for acute infarct but did show subacute right parietal infarct.  Patient denies residual deficit at that time. Patient does continue to take Xarelto for atrial fibrillation and denies increase in bleeding or bruising.  Patient does continue to take simvastatin without increase in myalgias.  Blood pressure satisfactory at today's visit 100/62.  Most recent A1c 8.4, patient has appointment with endocrinology next week.  Patient has been on gabapentin for bilateral lower extremity diabetic neuropathy.  Prescription dose gabapentin 300 mg 3 times a day.  Patient states that he takes 300 mg twice a day with mild effect but does not take away pain completely.  Patient denies this numbness or tingling as debilitating and states he is still able to function normally.  Denies any other symptoms such as weakness, dysarthria, aphasia, or any other numbness or tingling.  Denies new or worsening stroke/TIA symptoms.   ROS:   14 system review of systems is positive for  Numbness on right clavicle down to scapula, and numbness in the feet only and all other systems negative  PMH:  Past Medical History:    Diagnosis Date  . Actinic keratoses 08/09/2013   Overview:  IMPRESSION: Liquid nitrogen x2   . Acute inferior myocardial infarction ([place])    stenting in 2002 to the RCA  . Bowen's disease   . Coronary artery disease   . Diabetes mellitus    type II  . Dupuytren's contracture of left hand   . History of colonic polyps 04/14/2014  . Hypercholesterolemia   . Hyperlipidemia   . Hypertension    LCX  . Obesity   . Onychomycosis   . ONYCHOMYCOSIS 09/07/2006   Qualifier: Diagnosis of  By: Cletus Gash MD, [place]    . Panic disorder   . Peyronie's disease   . Prostatitis, chronic   . Right bundle branch block   . S/P coronary angioplasty   . Stroke ([place])   . Tinnitus     Social History:  Social History   Socioeconomic History  . Marital status: Married    Spouse name: Meryl Crutch  . Number of children: 1  . Years of education: Not on file  . Highest education level: Not on file  Social Needs  . Financial resource strain: Not on file  . Food insecurity - worry: Not on file  . Food insecurity - inability: Not  on file  . Transportation needs - medical: Not on file  . Transportation needs - non-medical: Not on file  Occupational History  . Occupation: Retired  Tobacco Use  . Smoking status: Former Smoker    Types: Cigarettes    Last attempt to quit: 11/17/1981    Years since quitting: 35.6  . Smokeless tobacco: Never Used  Substance and Sexual Activity  . Alcohol use: No    Alcohol/week: 0.0 oz  . Drug use: No  . Sexual activity: Not on file  Other Topics Concern  . Not on file  Social History Narrative   He is a retired Firefighter, he also served as a Psychiatrist.   Daily caffeine drinks    He is married and he has 1 son    Medications:   Current Outpatient Medications on File Prior to Visit  Medication Sig Dispense Refill  . albuterol (PROVENTIL HFA) 108 (90 Base) MCG/ACT inhaler Inhale into the lungs.    Marland Kitchen aspirin  EC 81 MG tablet Take 81 mg by mouth.    . Blood Glucose Monitoring Suppl (FIFTY50 GLUCOSE METER 2.0) W/DEVICE KIT by Does not apply route.    . Cyanocobalamin (VITAMIN B-12 PO) Take 1 tablet by mouth daily.      Marland Kitchen doxazosin (CARDURA) 1 MG tablet Take by mouth.    . ergocalciferol (VITAMIN D2) 50000 units capsule Take 50,000 Units by mouth once a week.    . ezetimibe (ZETIA) 10 MG tablet Take 10 mg by mouth daily.      . ferrous sulfate 325 (65 FE) MG tablet Take 325 mg by mouth daily with breakfast.    . furosemide (LASIX) 20 MG tablet Take by mouth.    . gabapentin (NEURONTIN) 300 MG capsule Take 300 mg by mouth 3 (three) times daily.    . insulin glargine (LANTUS) 100 UNIT/ML injection Inject 75 Units into the skin 2 (two) times daily.     . isosorbide mononitrate (IMDUR) 30 MG 24 hr tablet TAKE 2 TABLETS PO DAILY (Patient taking differently: Take 60 mg by mouth daily. ) 60 tablet 9  . lisinopril (PRINIVIL,ZESTRIL) 20 MG tablet Take 1 tablet (20 mg total) by mouth daily. 30 tablet 6  . metoprolol tartrate (LOPRESSOR) 25 MG tablet Take by mouth.    . nitroGLYCERIN (NITROSTAT) 0.4 MG SL tablet Place 1 tablet (0.4 mg total) under the tongue every 5 (five) minutes as needed. 25 tablet 1  . omeprazole (PRILOSEC) 40 MG capsule Take 1 capsule (40 mg total) by mouth daily. 30 capsule 6  . ONETOUCH VERIO test strip   1  . predniSONE (STERAPRED UNI-PAK 21 TAB) 10 MG (21) TBPK tablet     . Rivaroxaban (XARELTO) 15 MG TABS tablet Take 1 tablet by mouth once daily with a meal 90 tablet 3  . Semaglutide 1 MG/DOSE SOPN Inject 1.47m once a week    . simvastatin (ZOCOR) 40 MG tablet Take 40 mg by mouth daily.    . temazepam (RESTORIL) 15 MG capsule Take 15 mg by mouth at bedtime as needed for sleep.     No current facility-administered medications on file prior to visit.     Allergies:  No Known Allergies  Physical Exam General: Obese elderly Caucasian male seated, in no evident distress Head: head  normocephalic and atraumatic.  Neck: supple with no carotid or supraclavicular bruits Cardiovascular: regular rate and rhythm, no murmurs Musculoskeletal: no deformity; full  range of motion in neck and bilateral upper extremities Skin:  no rash/petichiae Vascular:  Normal pulses all extremities Vitals:   07/20/17 1228  BP: 100/62  Pulse: 72   Neurologic Exam Mental Status: Awake and fully alert. Oriented to place and time. Recent and remote memory intact. Attention span, concentration and fund of knowledge appropriate. Mood and affect appropriate.  Cranial Nerves: Fundoscopic exam Not done. Pupils equal, briskly reactive to light. Extraocular movements full without nystagmus. Visual fields full to confrontation. Hearing intact. Facial sensation intact. Face, tongue, palate moves normally and symmetrically.  Motor: Normal bulk and tone. Normal strength in all tested extremity muscles.  Sensory.: Diminished touch ,pinprick .position and vibratory sensation from ankle down bilaterally.  Denies diminished sensation around right clavicle down scapula. Coordination: Rapid alternating movements normal in all extremities. Finger-to-nose and heel-to-shin performed accurately bilaterally. Gait and Station: Arises from chair without difficulty. Stance is normal. Gait demonstrates normal stride length and balance . Unable ble to heel, toe and tandem walk with  difficulty.  Reflexes: 1+ and symmetric except both ankle jerks are depressed. Toes downgoing.       ASSESSMENT: 77 year male with left hemispheric TIA in July 2016 secondary to symptomatic left cavernous carotid stenosis. He also had a silent right parietal infarct and subsequently was found to have paroxysmal atrial fibrillation on outpatient cardiac monitoring. Is doing well from neurovascular standpoint. Multiple vascular risk factors of atrial fibrillation, cavernous carotid stenosis, diabetes, hypertension, hyperlipidemia and obesity.   Patient is being seen today for new complaint of right sided numbness rating from clavicle to scapula that has been present for approximately 1.5 weeks.  Denies trauma or numbness rating down the arm or up into the neck.    PLAN: As his pain has been present for 1.5 weeks and is not radiating down into arm or up into the neck, we will conservatively treat at this time.  Is recommended patient take Tylenol as needed for pain and use heat/ice.  Patient was provided with neck stretches and shoulder stretches. Emphasized that patient ensure he has a supportive pillow at night, and as he is a side sleeper, it is recommended that patient uses a pillow underneath his top arm in order to better align his body.  Patient was advised that if numbness or tingling gets worse or does go down into arm or of neck, experience weakness on one side of the body, or unable to speak/speech difficulties to call 911 immediately to be evaluated for possible stroke.  Patient also advised to call office with any concerns or questions but otherwise to follow-up in 6-8 weeks.  We will consider physical therapy or imaging at that time if needed.   Venancio Poisson, AGNP-BC  [facility] [address] [place], [place] [address]  Phone [phone] Fax [phone]    Note: This document was prepared with digital dictation and possible smart phrase technology. Any transcriptional errors that result from this process are unintentional

## 2017-07-20 NOTE — Patient Instructions (Signed)
Your Plan:  Take gabapentin as prescribed - 300mg  three times daily to see if this helps with lower extremity neuropathy pain  We will monitor this shoulder/neck nerve pain for the time being. If it gets worse, please call or call 911. Come back to see me in 6-8 weeks but in the mean time, take tylenol for pain and do neck/shoulder stretches. You can also use heat/ice as needed.   Call earlier if needed or with concerns/questions  Continue xarelto, simvastatin, blood pressure and diabetes control for stroke prevention     Thank you for coming to see Jesse Wheeler at [facility]. I hope we have been able to provide you high quality care today.  You may receive a patient satisfaction survey over the next few weeks. We would appreciate your feedback and comments so that we may continue to improve ourselves and the health of our patients.   Shoulder Exercises Ask your health care provider which exercises are safe for you. Do exercises exactly as told by your health care provider and adjust them as directed. It is normal to feel mild stretching, pulling, tightness, or discomfort as you do these exercises, but you should stop right away if you feel sudden pain or your pain gets worse.Do not begin these exercises until told by your health care provider. RANGE OF MOTION EXERCISES These exercises warm up your muscles and joints and improve the movement and flexibility of your shoulder. These exercises also help to relieve pain, numbness, and tingling. These exercises involve stretching your injured shoulder directly. Exercise A: Pendulum  1. Stand near a wall or a surface that you can hold onto for balance. 2. Bend at the waist and let your left / right arm hang straight down. Use your other arm to support you. Keep your back straight and do not lock your knees. 3. Relax your left / right arm and shoulder muscles, and move your hips and your trunk so your left / right arm swings freely.  Your arm should swing because of the motion of your body, not because you are using your arm or shoulder muscles. 4. Keep moving your body so your arm swings in the following directions, as told by your health care provider: ? Side to side. ? Forward and backward. ? In clockwise and counterclockwise circles. 5. Continue each motion for __________ seconds, or for as long as told by your health care provider. 6. Slowly return to the starting position. Repeat __________ times. Complete this exercise __________ times a day. Exercise B:Flexion, Standing  1. Stand and hold a broomstick, a cane, or a similar object. Place your hands a little more than shoulder-width apart on the object. Your left / right hand should be palm-up, and your other hand should be palm-down. 2. Keep your elbow straight and keep your shoulder muscles relaxed. Push the stick down with your healthy arm to raise your left / right arm in front of your body, and then over your head until you feel a stretch in your shoulder. ? Avoid shrugging your shoulder while you raise your arm. Keep your shoulder blade tucked down toward the middle of your back. 3. Hold for __________ seconds. 4. Slowly return to the starting position. Repeat __________ times. Complete this exercise __________ times a day. Exercise C: Abduction, Standing 1. Stand and hold a broomstick, a cane, or a similar object. Place your hands a little more than shoulder-width apart on the object. Your left / right hand should be palm-up,  and your other hand should be palm-down. 2. While keeping your elbow straight and your shoulder muscles relaxed, push the stick across your body toward your left / right side. Raise your left / right arm to the side of your body and then over your head until you feel a stretch in your shoulder. ? Do not raise your arm above shoulder height, unless your health care provider tells you to do that. ? Avoid shrugging your shoulder while you  raise your arm. Keep your shoulder blade tucked down toward the middle of your back. 3. Hold for __________ seconds. 4. Slowly return to the starting position. Repeat __________ times. Complete this exercise __________ times a day. Exercise D:Internal Rotation  1. Place your left / right hand behind your back, palm-up. 2. Use your other hand to dangle an exercise band, a towel, or a similar object over your shoulder. Grasp the band with your left / right hand so you are holding onto both ends. 3. Gently pull up on the band until you feel a stretch in the front of your left / right shoulder. ? Avoid shrugging your shoulder while you raise your arm. Keep your shoulder blade tucked down toward the middle of your back. 4. Hold for __________ seconds. 5. Release the stretch by letting go of the band and lowering your hands. Repeat __________ times. Complete this exercise __________ times a day. STRETCHING EXERCISES These exercises warm up your muscles and joints and improve the movement and flexibility of your shoulder. These exercises also help to relieve pain, numbness, and tingling. These exercises are done using your healthy shoulder to help stretch the muscles of your injured shoulder. Exercise E: Warehouse manager (External Rotation and Abduction)  1. Stand in a doorway with one of your feet slightly in front of the other. This is called a staggered stance. If you cannot reach your forearms to the door frame, stand facing a corner of a room. 2. Choose one of the following positions as told by your health care provider: ? Place your hands and forearms on the door frame above your head. ? Place your hands and forearms on the door frame at the height of your head. ? Place your hands on the door frame at the height of your elbows. 3. Slowly move your weight onto your front foot until you feel a stretch across your chest and in the front of your shoulders. Keep your head and chest upright and keep  your abdominal muscles tight. 4. Hold for __________ seconds. 5. To release the stretch, shift your weight to your back foot. Repeat __________ times. Complete this stretch __________ times a day. Exercise F:Extension, Standing 1. Stand and hold a broomstick, a cane, or a similar object behind your back. ? Your hands should be a little wider than shoulder-width apart. ? Your palms should face away from your back. 2. Keeping your elbows straight and keeping your shoulder muscles relaxed, move the stick away from your body until you feel a stretch in your shoulder. ? Avoid shrugging your shoulders while you move the stick. Keep your shoulder blade tucked down toward the middle of your back. 3. Hold for __________ seconds. 4. Slowly return to the starting position. Repeat __________ times. Complete this exercise __________ times a day. STRENGTHENING EXERCISES These exercises build strength and endurance in your shoulder. Endurance is the ability to use your muscles for a long time, even after they get tired. Exercise G:External Rotation  1. Sit in a  stable chair without armrests. 2. Secure an exercise band at elbow height on your left / right side. 3. Place a soft object, such as a folded towel or a small pillow, between your left / right upper arm and your body to move your elbow a few inches away (about 10 cm) from your side. 4. Hold the end of the band so it is tight and there is no slack. 5. Keeping your elbow pressed against the soft object, move your left / right forearm out, away from your abdomen. Keep your body steady so only your forearm moves. 6. Hold for __________ seconds. 7. Slowly return to the starting position. Repeat __________ times. Complete this exercise __________ times a day. Exercise H:Shoulder Abduction  1. Sit in a stable chair without armrests, or stand. 2. Hold a __________ weight in your left / right hand, or hold an exercise band with both hands. 3. Start  with your arms straight down and your left / right palm facing in, toward your body. 4. Slowly lift your left / right hand out to your side. Do not lift your hand above shoulder height unless your health care provider tells you that this is safe. ? Keep your arms straight. ? Avoid shrugging your shoulder while you do this movement. Keep your shoulder blade tucked down toward the middle of your back. 5. Hold for __________ seconds. 6. Slowly lower your arm, and return to the starting position. Repeat __________ times. Complete this exercise __________ times a day. Exercise I:Shoulder Extension 1. Sit in a stable chair without armrests, or stand. 2. Secure an exercise band to a stable object in front of you where it is at shoulder height. 3. Hold one end of the exercise band in each hand. Your palms should face each other. 4. Straighten your elbows and lift your hands up to shoulder height. 5. Step back, away from the secured end of the exercise band, until the band is tight and there is no slack. 6. Squeeze your shoulder blades together as you pull your hands down to the sides of your thighs. Stop when your hands are straight down by your sides. Do not let your hands go behind your body. 7. Hold for __________ seconds. 8. Slowly return to the starting position. Repeat __________ times. Complete this exercise __________ times a day. Exercise J:Standing Shoulder Row 1. Sit in a stable chair without armrests, or stand. 2. Secure an exercise band to a stable object in front of you so it is at waist height. 3. Hold one end of the exercise band in each hand. Your palms should be in a thumbs-up position. 4. Bend each of your elbows to an "L" shape (about 90 degrees) and keep your upper arms at your sides. 5. Step back until the band is tight and there is no slack. 6. Slowly pull your elbows back behind you. 7. Hold for __________ seconds. 8. Slowly return to the starting position. Repeat  __________ times. Complete this exercise __________ times a day. Exercise K:Shoulder Press-Ups  1. Sit in a stable chair that has armrests. Sit upright, with your feet flat on the floor. 2. Put your hands on the armrests so your elbows are bent and your fingers are pointing forward. Your hands should be about even with the sides of your body. 3. Push down on the armrests and use your arms to lift yourself off of the chair. Straighten your elbows and lift yourself up as much as you comfortably can. ?  Move your shoulder blades down, and avoid letting your shoulders move up toward your ears. ? Keep your feet on the ground. As you get stronger, your feet should support less of your body weight as you lift yourself up. 4. Hold for __________ seconds. 5. Slowly lower yourself back into the chair. Repeat __________ times. Complete this exercise __________ times a day. Exercise L: Wall Push-Ups  1. Stand so you are facing a stable wall. Your feet should be about one arm-length away from the wall. 2. Lean forward and place your palms on the wall at shoulder height. 3. Keep your feet flat on the floor as you bend your elbows and lean forward toward the wall. 4. Hold for __________ seconds. 5. Straighten your elbows to push yourself back to the starting position. Repeat __________ times. Complete this exercise __________ times a day. This information is not intended to replace advice given to you by your health care provider. Make sure you discuss any questions you have with your health care provider. Document Released: 03/16/2005 Document Revised: 01/25/2016 Document Reviewed: 01/11/2015 Elsevier Interactive Patient Education  2018 [employer].                Neck Exercises Neck exercises can be important for many reasons:  They can help you to improve and maintain flexibility in your neck. This can be especially important as you age.  They can help to make your neck stronger. This  can make movement easier.  They can reduce or prevent neck pain.  They may help your upper back.  Ask your health care provider which neck exercises would be best for you. Exercises Neck Press Repeat this exercise 10 times. Do it first thing in the morning and right before bed or as told by your health care provider. 1. Lie on your back on a firm bed or on the floor with a pillow under your head. 2. Use your neck muscles to push your head down on the pillow and straighten your spine. 3. Hold the position as well as you can. Keep your head facing up and your chin tucked. 4. Slowly count to 5 while holding this position. 5. Relax for a few seconds. Then repeat.  Isometric Strengthening Do a full set of these exercises 2 times a day or as told by your health care provider. 1. Sit in a supportive chair and place your hand on your forehead. 2. Push forward with your head and neck while pushing back with your hand. Hold for 10 seconds. 3. Relax. Then repeat the exercise 3 times. 4. Next, do thesequence again, this time putting your hand against the back of your head. Use your head and neck to push backward against the hand pressure. 5. Finally, do the same exercise on either side of your head, pushing sideways against the pressure of your hand.  Prone Head Lifts Repeat this exercise 5 times. Do this 2 times a day or as told by your health care provider. 1. Lie face-down, resting on your elbows so that your chest and upper back are raised. 2. Start with your head facing downward, near your chest. Position your chin either on or near your chest. 3. Slowly lift your head upward. Lift until you are looking straight ahead. Then continue lifting your head as far back as you can stretch. 4. Hold your head up for 5 seconds. Then slowly lower it to your starting position.  Supine Head Lifts Repeat this exercise 8-10 times. Do this  2 times a day or as told by your health care provider. 1. Lie on  your back, bending your knees to point to the ceiling and keeping your feet flat on the floor. 2. Lift your head slowly off the floor, raising your chin toward your chest. 3. Hold for 5 seconds. 4. Relax and repeat.  Scapular Retraction Repeat this exercise 5 times. Do this 2 times a day or as told by your health care provider. 1. Stand with your arms at your sides. Look straight ahead. 2. Slowly pull both shoulders backward and downward until you feel a stretch between your shoulder blades in your upper back. 3. Hold for 10-30 seconds. 4. Relax and repeat.  Contact a health care provider if:  Your neck pain or discomfort gets much worse when you do an exercise.  Your neck pain or discomfort does not improve within 2 hours after you exercise. If you have any of these problems, stop exercising right away. Do not do the exercises again unless your health care provider says that you can. Get help right away if:  You develop sudden, severe neck pain. If this happens, stop exercising right away. Do not do the exercises again unless your health care provider says that you can. Exercises Neck Stretch  Repeat this exercise 3-5 times. 1. Do this exercise while standing or while sitting in a chair. 2. Place your feet flat on the floor, shoulder-width apart. 3. Slowly turn your head to the right. Turn it all the way to the right so you can look over your right shoulder. Do not tilt or tip your head. 4. Hold this position for 10-30 seconds. 5. Slowly turn your head to the left, to look over your left shoulder. 6. Hold this position for 10-30 seconds.  Neck Retraction Repeat this exercise 8-10 times. Do this 3-4 times a day or as told by your health care provider. 1. Do this exercise while standing or while sitting in a sturdy chair. 2. Look straight ahead. Do not bend your neck. 3. Use your fingers to push your chin backward. Do not bend your neck for this movement. Continue to face straight  ahead. If you are doing the exercise properly, you will feel a slight sensation in your throat and a stretch at the back of your neck. 4. Hold the stretch for 1-2 seconds. Relax and repeat.  This information is not intended to replace advice given to you by your health care provider. Make sure you discuss any questions you have with your health care provider. Document Released: 04/13/2015 Document Revised: 10/08/2015 Document Reviewed: 11/10/2014 Elsevier Interactive Patient Education  [employer].

## 2017-07-21 NOTE — Progress Notes (Signed)
I agree with the assessment and plan as directed by NP Dory Peru .The patient is not known to me, but I was available  for consultation .   Marcy Bogosian, MD

## 2017-07-24 ENCOUNTER — Other Ambulatory Visit (INDEPENDENT_AMBULATORY_CARE_PROVIDER_SITE_OTHER): Payer: Medicare Other

## 2017-07-24 ENCOUNTER — Ambulatory Visit: Payer: Medicare Other | Admitting: Internal Medicine

## 2017-07-24 ENCOUNTER — Encounter: Payer: Self-pay | Admitting: Internal Medicine

## 2017-07-24 VITALS — BP 114/50 | HR 68 | Ht 68.5 in | Wt 288.2 lb

## 2017-07-24 DIAGNOSIS — Z7901 Long term (current) use of anticoagulants: Secondary | ICD-10-CM | POA: Diagnosis not present

## 2017-07-24 DIAGNOSIS — D500 Iron deficiency anemia secondary to blood loss (chronic): Secondary | ICD-10-CM

## 2017-07-24 LAB — CBC WITH DIFFERENTIAL/PLATELET
BASOS PCT: 0.9 % (ref 0.0–3.0)
Basophils Absolute: 0.1 10*3/uL (ref 0.0–0.1)
Eosinophils Absolute: 0.2 10*3/uL (ref 0.0–0.7)
Eosinophils Relative: 2.3 % (ref 0.0–5.0)
HEMATOCRIT: 42.2 % (ref 39.0–52.0)
Hemoglobin: 14.1 g/dL (ref 13.0–17.0)
LYMPHS PCT: 28.3 % (ref 12.0–46.0)
Lymphs Abs: 2.3 10*3/uL (ref 0.7–4.0)
MCHC: 33.3 g/dL (ref 30.0–36.0)
MCV: 95.7 fl (ref 78.0–100.0)
Monocytes Absolute: 0.7 10*3/uL (ref 0.1–1.0)
Monocytes Relative: 8.2 % (ref 3.0–12.0)
NEUTROS ABS: 4.9 10*3/uL (ref 1.4–7.7)
Neutrophils Relative %: 60.3 % (ref 43.0–77.0)
PLATELETS: 165 10*3/uL (ref 150.0–400.0)
RBC: 4.41 Mil/uL (ref 4.22–5.81)
RDW: 13.6 % (ref 11.5–15.5)
WBC: 8.1 10*3/uL (ref 4.0–10.5)

## 2017-07-24 NOTE — Patient Instructions (Signed)
Normal BMI (Body Mass Index- based on height and weight) is between 23 and 30. Your BMI today is Body mass index is 43.19 kg/m. Marland Kitchen Please consider follow up  regarding your BMI with your Primary Care Provider.   We are going to obtain your CT reports from 2018/2019 for review from the [place] in [place].    Your physician has requested that you go to the basement for the following lab work before leaving today: CBC/diff, Ferritin   I appreciate the opportunity to care for you. Silvano Rusk, MD, [facility]

## 2017-07-24 NOTE — Progress Notes (Signed)
Jesse Wheeler 77 y.o. [birth year] [id number]  Assessment & Plan:   Encounter Diagnoses  Name Primary?  . Iron deficiency anemia due to chronic blood loss Yes  . Long term current use of anticoagulant - xarelto       So far we know that he had some superficial ulcers in the stomach which may have contributed some to the blood loss, the colonoscopy in 2015 did not show any lesions that would and a diminutive polyp there, contribute to chronic blood loss.  He has not had a capsule endoscopy.  He has responded but not sustained his iron levels with Feraheme i.e. he needed a repeat treatment.  We are going to recheck his CBC and ferritin today and I am going to review the records of his CT scans from [place].  He does not seem to have any overt signs of malignancy he is not losing weight having bowel habit changes etc. but I explained to him that is in the background of possibilities.  Will need to consider what if any additional workup might need to be done depending upon results from the testing described above.  I appreciate the opportunity to care for this patient. CC: Clinic, [facility]   Subjective:   Chief Complaint:  HPI The patient is here for a follow-up visit alone, because of iron deficiency anemia presumed due to chronic blood loss though exact etiology not 100% clear.  He had a screening colonoscopy with a diminutive polyp removed in 2015, and then subsequently went on Xarelto after a TIA and developed anemia.  Last year in the summer he had 2 superficial benign gastric ulcers found and was treated with PPI.  He has had Feraheme infusions since then I think on 2 separate occasions he has had the double infusions.  When last checked late last year his hemoglobin was normal as was his ferritin though did not have the follow-up testing I had recommended.  He feels well today overall though is frustrated because his [facility] primary care doctor left and his other civilian  primary care doctor has moved and he is not happy with his current endocrinologist as well.  His Metformin was stopped when he had a CT scan that I recommended.  I have not yet seen the copies of the CT report from the [facility] but he says other than a spot on his lung that they are following up, a 6 mm 1, there were no problems seen on the abdominal pelvic CT scan that was ordered because of his left lower quadrant pain.  He is not having any pain or GI symptoms at this time he reports, no signs of bleeding.  He does remain on oral iron therapy and his B12 level was greater than 1000. No Known Allergies Current Meds  Medication Sig  . albuterol (PROVENTIL HFA) 108 (90 Base) MCG/ACT inhaler Inhale into the lungs.  Marland Kitchen aspirin EC 81 MG tablet Take 81 mg by mouth.  . Blood Glucose Monitoring Suppl (FIFTY50 GLUCOSE METER 2.0) W/DEVICE KIT by Does not apply route.  . Cyanocobalamin (VITAMIN B-12 PO) Take 1 tablet by mouth daily.    Marland Kitchen doxazosin (CARDURA) 1 MG tablet Take by mouth.  . ergocalciferol (VITAMIN D2) 50000 units capsule Take 50,000 Units by mouth once a week.  . ezetimibe (ZETIA) 10 MG tablet Take 10 mg by mouth daily.    . ferrous sulfate 325 (65 FE) MG tablet Take 325 mg by mouth daily with breakfast.  .  furosemide (LASIX) 20 MG tablet Take by mouth.  . gabapentin (NEURONTIN) 300 MG capsule Take 300 mg by mouth 3 (three) times daily.  . insulin glargine (LANTUS) 100 UNIT/ML injection Inject 75 Units into the skin 2 (two) times daily.   . isosorbide mononitrate (IMDUR) 30 MG 24 hr tablet TAKE 2 TABLETS PO DAILY  . lisinopril (PRINIVIL,ZESTRIL) 20 MG tablet Take 1 tablet (20 mg total) by mouth daily.  . metoprolol tartrate (LOPRESSOR) 25 MG tablet Take by mouth.  Glory Rosebush VERIO test strip   . predniSONE (STERAPRED UNI-PAK 21 TAB) 10 MG (21) TBPK tablet   . Rivaroxaban (XARELTO) 15 MG TABS tablet Take 1 tablet by mouth once daily with a meal  . Semaglutide 1 MG/DOSE SOPN Inject 1.35m  once a week  . simvastatin (ZOCOR) 40 MG tablet Take 40 mg by mouth daily.  . temazepam (RESTORIL) 15 MG capsule Take 15 mg by mouth at bedtime as needed for sleep.   Past Medical History:  Diagnosis Date  . Actinic keratoses 08/09/2013   Overview:  IMPRESSION: Liquid nitrogen x2   . Acute inferior myocardial infarction ([place]    stenting in 2002 to the RCA  . Bowen's disease   . Coronary artery disease   . Diabetes mellitus    type II  . Dupuytren's contracture of left hand   . History of colonic polyps 04/14/2014  . Hypercholesterolemia   . Hyperlipidemia   . Hypertension    LCX  . Obesity   . Onychomycosis   . ONYCHOMYCOSIS 09/07/2006   Qualifier: Diagnosis of  By: ACletus GashMD, LBertram   . Panic disorder   . Peyronie's disease   . Prostatitis, chronic   . Right bundle branch block   . S/P coronary angioplasty   . Stroke ([place]   . Tinnitus    Past Surgical History:  Procedure Laterality Date  . CATARACT EXTRACTION W/ INTRAOCULAR LENS  IMPLANT, BILATERAL    . CHOLECYSTECTOMY    . COLONOSCOPY  03/09/2004   Normal  . CORONARY ANGIOPLASTY  01/02/01   Acute inferior MI -- Stent placement in the proximal RCA (three separate stents totalling approximately 55 mm of stent with residual thrombus in the posterior descending artery) --  Relatively mild inferior basilar hypokinesia --Severe stenosis in a secondary marginal vessel of the left circumflex -- Minimal coronary atherosclerosis otherwise of the coronary system   . Nuclear stress test  Oct 2011    Dense inferior infarct; mild ischemia with dilated LV. EF was 39%. 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   Review of Systems  As above Objective:   Physical Exam BP (!) 114/50 (BP Location: Left Arm, Patient Position: Sitting, Cuff Size: Normal)   Pulse 68 Comment: irregular  Ht 5' 8.5" (1.74 m) Comment: height measured without shoes  Wt 288 lb 4 oz (130.7 kg)   BMI 43.19 kg/m   15 minutes time spent with patient > half in counseling coordination of care

## 2017-07-25 ENCOUNTER — Other Ambulatory Visit: Payer: Self-pay

## 2017-07-25 DIAGNOSIS — D649 Anemia, unspecified: Secondary | ICD-10-CM

## 2017-07-25 LAB — FERRITIN: Ferritin: 71.8 ng/mL (ref 22.0–322.0)

## 2017-07-25 NOTE — Progress Notes (Signed)
Hgb and ferritin remain Normal Repeat CBC and ferritin in 3 mos I am waiting on copies of the CT scan he had at [facility] also If he does not hear from me w/in 2 weeks he should call us back

## 2017-09-01 ENCOUNTER — Encounter (HOSPITAL_COMMUNITY): Payer: Self-pay | Admitting: Emergency Medicine

## 2017-09-01 ENCOUNTER — Inpatient Hospital Stay (HOSPITAL_COMMUNITY)
Admission: EM | Admit: 2017-09-01 | Discharge: 2017-09-03 | DRG: 872 | Disposition: A | Discharge status: Home / Self Care | Payer: Medicare Other | Attending: Family Medicine | Admitting: Family Medicine

## 2017-09-01 ENCOUNTER — Observation Stay (HOSPITAL_COMMUNITY): Payer: Medicare Other

## 2017-09-01 ENCOUNTER — Other Ambulatory Visit: Payer: Self-pay

## 2017-09-01 DIAGNOSIS — I4510 Unspecified right bundle-branch block: Secondary | ICD-10-CM | POA: Diagnosis present

## 2017-09-01 DIAGNOSIS — N179 Acute kidney failure, unspecified: Secondary | ICD-10-CM | POA: Diagnosis not present

## 2017-09-01 DIAGNOSIS — I480 Paroxysmal atrial fibrillation: Secondary | ICD-10-CM | POA: Diagnosis present

## 2017-09-01 DIAGNOSIS — D696 Thrombocytopenia, unspecified: Secondary | ICD-10-CM | POA: Diagnosis not present

## 2017-09-01 DIAGNOSIS — A419 Sepsis, unspecified organism: Principal | ICD-10-CM | POA: Diagnosis present

## 2017-09-01 DIAGNOSIS — I2510 Atherosclerotic heart disease of native coronary artery without angina pectoris: Secondary | ICD-10-CM | POA: Diagnosis present

## 2017-09-01 DIAGNOSIS — N411 Chronic prostatitis: Secondary | ICD-10-CM | POA: Diagnosis present

## 2017-09-01 DIAGNOSIS — D6959 Other secondary thrombocytopenia: Secondary | ICD-10-CM | POA: Diagnosis present

## 2017-09-01 DIAGNOSIS — Z8249 Family history of ischemic heart disease and other diseases of the circulatory system: Secondary | ICD-10-CM

## 2017-09-01 DIAGNOSIS — Z7982 Long term (current) use of aspirin: Secondary | ICD-10-CM

## 2017-09-01 DIAGNOSIS — Z8673 Personal history of transient ischemic attack (TIA), and cerebral infarction without residual deficits: Secondary | ICD-10-CM

## 2017-09-01 DIAGNOSIS — G4700 Insomnia, unspecified: Secondary | ICD-10-CM | POA: Diagnosis present

## 2017-09-01 DIAGNOSIS — I252 Old myocardial infarction: Secondary | ICD-10-CM

## 2017-09-01 DIAGNOSIS — Z79899 Other long term (current) drug therapy: Secondary | ICD-10-CM

## 2017-09-01 DIAGNOSIS — Z86010 Personal history of colonic polyps: Secondary | ICD-10-CM | POA: Diagnosis not present

## 2017-09-01 DIAGNOSIS — D509 Iron deficiency anemia, unspecified: Secondary | ICD-10-CM | POA: Diagnosis present

## 2017-09-01 DIAGNOSIS — Z66 Do not resuscitate: Secondary | ICD-10-CM | POA: Diagnosis present

## 2017-09-01 DIAGNOSIS — E1165 Type 2 diabetes mellitus with hyperglycemia: Secondary | ICD-10-CM | POA: Diagnosis present

## 2017-09-01 DIAGNOSIS — N3090 Cystitis, unspecified without hematuria: Secondary | ICD-10-CM

## 2017-09-01 DIAGNOSIS — E669 Obesity, unspecified: Secondary | ICD-10-CM | POA: Diagnosis present

## 2017-09-01 DIAGNOSIS — I10 Essential (primary) hypertension: Secondary | ICD-10-CM | POA: Diagnosis not present

## 2017-09-01 DIAGNOSIS — R350 Frequency of micturition: Secondary | ICD-10-CM | POA: Diagnosis not present

## 2017-09-01 DIAGNOSIS — Z87891 Personal history of nicotine dependence: Secondary | ICD-10-CM

## 2017-09-01 DIAGNOSIS — E1122 Type 2 diabetes mellitus with diabetic chronic kidney disease: Secondary | ICD-10-CM | POA: Diagnosis present

## 2017-09-01 DIAGNOSIS — F410 Panic disorder [episodic paroxysmal anxiety] without agoraphobia: Secondary | ICD-10-CM | POA: Diagnosis present

## 2017-09-01 DIAGNOSIS — N390 Urinary tract infection, site not specified: Secondary | ICD-10-CM | POA: Diagnosis present

## 2017-09-01 DIAGNOSIS — N183 Chronic kidney disease, stage 3 unspecified: Secondary | ICD-10-CM

## 2017-09-01 DIAGNOSIS — E1129 Type 2 diabetes mellitus with other diabetic kidney complication: Secondary | ICD-10-CM | POA: Diagnosis present

## 2017-09-01 DIAGNOSIS — Z833 Family history of diabetes mellitus: Secondary | ICD-10-CM

## 2017-09-01 DIAGNOSIS — R413 Other amnesia: Secondary | ICD-10-CM | POA: Diagnosis present

## 2017-09-01 DIAGNOSIS — I129 Hypertensive chronic kidney disease with stage 1 through stage 4 chronic kidney disease, or unspecified chronic kidney disease: Secondary | ICD-10-CM | POA: Diagnosis present

## 2017-09-01 DIAGNOSIS — Z961 Presence of intraocular lens: Secondary | ICD-10-CM | POA: Diagnosis present

## 2017-09-01 DIAGNOSIS — Z794 Long term (current) use of insulin: Secondary | ICD-10-CM

## 2017-09-01 DIAGNOSIS — Z9841 Cataract extraction status, right eye: Secondary | ICD-10-CM

## 2017-09-01 DIAGNOSIS — Z6841 Body Mass Index (BMI) 40.0 and over, adult: Secondary | ICD-10-CM

## 2017-09-01 DIAGNOSIS — E785 Hyperlipidemia, unspecified: Secondary | ICD-10-CM | POA: Diagnosis present

## 2017-09-01 DIAGNOSIS — IMO0002 Reserved for concepts with insufficient information to code with codable children: Secondary | ICD-10-CM | POA: Diagnosis present

## 2017-09-01 DIAGNOSIS — Z7901 Long term (current) use of anticoagulants: Secondary | ICD-10-CM

## 2017-09-01 DIAGNOSIS — Z9842 Cataract extraction status, left eye: Secondary | ICD-10-CM

## 2017-09-01 DIAGNOSIS — E7800 Pure hypercholesterolemia, unspecified: Secondary | ICD-10-CM | POA: Diagnosis present

## 2017-09-01 DIAGNOSIS — Z9861 Coronary angioplasty status: Secondary | ICD-10-CM

## 2017-09-01 DIAGNOSIS — Z9049 Acquired absence of other specified parts of digestive tract: Secondary | ICD-10-CM

## 2017-09-01 HISTORY — DX: Acute kidney failure, unspecified: N17.9

## 2017-09-01 LAB — PROCALCITONIN: PROCALCITONIN: 1.15 ng/mL

## 2017-09-01 LAB — GLUCOSE, CAPILLARY
Glucose-Capillary: 214 mg/dL — ABNORMAL HIGH (ref 65–99)
Glucose-Capillary: 222 mg/dL — ABNORMAL HIGH (ref 65–99)

## 2017-09-01 LAB — URINALYSIS, ROUTINE W REFLEX MICROSCOPIC
Bilirubin Urine: NEGATIVE
GLUCOSE, UA: NEGATIVE mg/dL
Hgb urine dipstick: NEGATIVE
Ketones, ur: NEGATIVE mg/dL
Nitrite: NEGATIVE
PH: 5 (ref 5.0–8.0)
Protein, ur: NEGATIVE mg/dL
SPECIFIC GRAVITY, URINE: 1.017 (ref 1.005–1.030)
SQUAMOUS EPITHELIAL / LPF: NONE SEEN

## 2017-09-01 LAB — LACTIC ACID, PLASMA: Lactic Acid, Venous: 1.9 mmol/L (ref 0.5–1.9)

## 2017-09-01 LAB — CBC
HCT: 45.9 % (ref 39.0–52.0)
HEMOGLOBIN: 15.5 g/dL (ref 13.0–17.0)
MCH: 32.3 pg (ref 26.0–34.0)
MCHC: 33.8 g/dL (ref 30.0–36.0)
MCV: 95.6 fL (ref 78.0–100.0)
Platelets: 156 10*3/uL (ref 150–400)
RBC: 4.8 MIL/uL (ref 4.22–5.81)
RDW: 14.2 % (ref 11.5–15.5)
WBC: 18.4 10*3/uL — ABNORMAL HIGH (ref 4.0–10.5)

## 2017-09-01 LAB — I-STAT CG4 LACTIC ACID, ED
Lactic Acid, Venous: 2.25 mmol/L (ref 0.5–1.9)
Lactic Acid, Venous: 2.97 mmol/L (ref 0.5–1.9)

## 2017-09-01 LAB — BASIC METABOLIC PANEL
ANION GAP: 14 (ref 5–15)
BUN: 23 mg/dL — ABNORMAL HIGH (ref 6–20)
CHLORIDE: 100 mmol/L — AB (ref 101–111)
CO2: 20 mmol/L — AB (ref 22–32)
Calcium: 9.3 mg/dL (ref 8.9–10.3)
Creatinine, Ser: 2.11 mg/dL — ABNORMAL HIGH (ref 0.61–1.24)
GFR calc non Af Amer: 29 mL/min — ABNORMAL LOW (ref >60)
GFR, EST AFRICAN AMERICAN: 34 mL/min — AB (ref >60)
GLUCOSE: 187 mg/dL — AB (ref 65–99)
Potassium: 4.2 mmol/L (ref 3.5–5.1)
Sodium: 134 mmol/L — ABNORMAL LOW (ref 135–145)

## 2017-09-01 LAB — PROTIME-INR
INR: 1.59
PROTHROMBIN TIME: 18.8 s — AB (ref 11.4–15.2)

## 2017-09-01 LAB — APTT: aPTT: 41 seconds — ABNORMAL HIGH (ref 24–36)

## 2017-09-01 MED ORDER — SIMVASTATIN 40 MG PO TABS
40.0000 mg | ORAL_TABLET | Freq: Every day | ORAL | Status: DC
  Administered 2017-09-01 – 2017-09-02 (×2): 40 mg via ORAL
  Filled 2017-09-01 (×2): qty 1

## 2017-09-01 MED ORDER — GABAPENTIN 300 MG PO CAPS
600.0000 mg | ORAL_CAPSULE | Freq: Two times a day (BID) | ORAL | Status: DC
  Administered 2017-09-01 – 2017-09-03 (×4): 600 mg via ORAL
  Filled 2017-09-01 (×4): qty 2

## 2017-09-01 MED ORDER — ONDANSETRON HCL 4 MG/2ML IJ SOLN: 4.0000 mg | Freq: Four times a day (QID) | INTRAMUSCULAR | Status: DC | PRN

## 2017-09-01 MED ORDER — INSULIN ASPART 100 UNIT/ML ~~LOC~~ SOLN
0.0000 [IU] | Freq: Three times a day (TID) | SUBCUTANEOUS | Status: DC
  Administered 2017-09-02 (×3): 3 [IU] via SUBCUTANEOUS
  Administered 2017-09-03: 2 [IU] via SUBCUTANEOUS
  Administered 2017-09-03: 5 [IU] via SUBCUTANEOUS

## 2017-09-01 MED ORDER — SODIUM CHLORIDE 0.9 % IV SOLN
1.0000 g | Freq: Once | INTRAVENOUS | Status: AC
  Administered 2017-09-01: 1 g via INTRAVENOUS
  Filled 2017-09-01: qty 10

## 2017-09-01 MED ORDER — ACETAMINOPHEN 325 MG PO TABS
650.0000 mg | ORAL_TABLET | Freq: Four times a day (QID) | ORAL | Status: DC | PRN
  Administered 2017-09-02: 650 mg via ORAL
  Filled 2017-09-01: qty 2

## 2017-09-01 MED ORDER — TEMAZEPAM 15 MG PO CAPS
15.0000 mg | ORAL_CAPSULE | Freq: Every evening | ORAL | Status: DC | PRN
  Filled 2017-09-01: qty 1

## 2017-09-01 MED ORDER — SODIUM CHLORIDE 0.9 % IV BOLUS (SEPSIS)
200.0000 mL | Freq: Once | INTRAVENOUS | Status: AC
  Administered 2017-09-01: 200 mL via INTRAVENOUS

## 2017-09-01 MED ORDER — SODIUM CHLORIDE 0.9 % IV BOLUS
1000.0000 mL | Freq: Once | INTRAVENOUS | Status: AC
  Administered 2017-09-01: 1000 mL via INTRAVENOUS

## 2017-09-01 MED ORDER — NITROGLYCERIN 0.4 MG SL SUBL: 0.4000 mg | SUBLINGUAL_TABLET | SUBLINGUAL | Status: DC | PRN

## 2017-09-01 MED ORDER — FUROSEMIDE 20 MG PO TABS
20.0000 mg | ORAL_TABLET | Freq: Every day | ORAL | Status: DC
  Administered 2017-09-02 – 2017-09-03 (×2): 20 mg via ORAL
  Filled 2017-09-01 (×2): qty 1

## 2017-09-01 MED ORDER — SODIUM CHLORIDE 0.9 % IV BOLUS (SEPSIS)
1000.0000 mL | Freq: Once | INTRAVENOUS | Status: AC
  Administered 2017-09-01: 1000 mL via INTRAVENOUS

## 2017-09-01 MED ORDER — ACETAMINOPHEN 650 MG RE SUPP: 650.0000 mg | Freq: Four times a day (QID) | RECTAL | Status: DC | PRN

## 2017-09-01 MED ORDER — METOPROLOL SUCCINATE ER 25 MG PO TB24
12.5000 mg | ORAL_TABLET | Freq: Every day | ORAL | Status: DC
  Administered 2017-09-01 – 2017-09-03 (×3): 12.5 mg via ORAL
  Filled 2017-09-01 (×3): qty 1

## 2017-09-01 MED ORDER — SODIUM CHLORIDE 0.9 % IV SOLN
2.0000 g | INTRAVENOUS | Status: DC
  Administered 2017-09-02: 2 g via INTRAVENOUS
  Filled 2017-09-01: qty 20

## 2017-09-01 MED ORDER — ASPIRIN EC 81 MG PO TBEC
81.0000 mg | DELAYED_RELEASE_TABLET | Freq: Every day | ORAL | Status: DC
  Administered 2017-09-01 – 2017-09-03 (×3): 81 mg via ORAL
  Filled 2017-09-01 (×3): qty 1

## 2017-09-01 MED ORDER — ONDANSETRON HCL 4 MG PO TABS: 4.0000 mg | ORAL_TABLET | Freq: Four times a day (QID) | ORAL | Status: DC | PRN

## 2017-09-01 MED ORDER — DOXAZOSIN MESYLATE 8 MG PO TABS
8.0000 mg | ORAL_TABLET | Freq: Every day | ORAL | Status: DC
  Administered 2017-09-01 – 2017-09-03 (×3): 8 mg via ORAL
  Filled 2017-09-01 (×3): qty 1

## 2017-09-01 MED ORDER — LACTATED RINGERS IV BOLUS: 1000.0000 mL | Freq: Once | INTRAVENOUS | Status: DC

## 2017-09-01 MED ORDER — ISOSORBIDE MONONITRATE ER 60 MG PO TB24
60.0000 mg | ORAL_TABLET | Freq: Every day | ORAL | Status: DC
  Administered 2017-09-01 – 2017-09-03 (×3): 60 mg via ORAL
  Filled 2017-09-01 (×3): qty 1

## 2017-09-01 MED ORDER — ACETAMINOPHEN 325 MG PO TABS
650.0000 mg | ORAL_TABLET | Freq: Once | ORAL | Status: AC
  Administered 2017-09-01: 650 mg via ORAL
  Filled 2017-09-01: qty 2

## 2017-09-01 MED ORDER — RIVAROXABAN 15 MG PO TABS
15.0000 mg | ORAL_TABLET | Freq: Every day | ORAL | Status: DC
  Administered 2017-09-01 – 2017-09-02 (×2): 15 mg via ORAL
  Filled 2017-09-01 (×2): qty 1

## 2017-09-01 MED ORDER — INSULIN GLARGINE 100 UNIT/ML ~~LOC~~ SOLN
56.0000 [IU] | Freq: Two times a day (BID) | SUBCUTANEOUS | Status: DC
  Administered 2017-09-01 – 2017-09-03 (×4): 56 [IU] via SUBCUTANEOUS
  Filled 2017-09-01 (×4): qty 0.56

## 2017-09-01 MED ORDER — SODIUM CHLORIDE 0.9% FLUSH
3.0000 mL | Freq: Two times a day (BID) | INTRAVENOUS | Status: DC
  Administered 2017-09-02 – 2017-09-03 (×2): 3 mL via INTRAVENOUS

## 2017-09-01 MED ORDER — GLIPIZIDE 5 MG PO TABS
5.0000 mg | ORAL_TABLET | Freq: Two times a day (BID) | ORAL | Status: DC
  Administered 2017-09-02 – 2017-09-03 (×3): 5 mg via ORAL
  Filled 2017-09-01 (×3): qty 1

## 2017-09-01 NOTE — ED Notes (Signed)
Pt ambulated to bathroom Independently

## 2017-09-01 NOTE — ED Provider Notes (Addendum)
[facility] EMERGENCY DEPARTMENT Provider Note   CSN: [id number] Arrival date & time: 09/01/17  1159     History   Chief Complaint Chief Complaint  Patient presents with  . Polyuria  . Dysuria    HPI Jesse Wheeler is a 77 y.o. male.  The history is provided by the patient.  Dysuria   This is a new problem. The current episode started 2 days ago. The problem occurs every urination. The problem has not changed since onset.The quality of the pain is described as burning. The pain is at a severity of 3/10. The pain is mild. There has been no fever. He is not sexually active. There is no history of pyelonephritis. Associated symptoms include frequency and hesitancy. Pertinent negatives include no chills, no sweats, no nausea, no vomiting, no discharge, no hematuria, no possible pregnancy, no urgency and no flank pain. He has tried nothing for the symptoms. His past medical history does not include kidney stones, single kidney, urological procedure, recurrent UTIs, urinary stasis or catheterization.    Past Medical History:  Diagnosis Date  . Actinic keratoses 08/09/2013   Overview:  IMPRESSION: Liquid nitrogen x2   . Acute inferior myocardial infarction ([place])    stenting in 2002 to the RCA  . Bowen's disease   . Coronary artery disease   . Diabetes mellitus    type II  . Dupuytren's contracture of left hand   . History of colonic polyps 04/14/2014  . Hypercholesterolemia   . Hyperlipidemia   . Hypertension    LCX  . Obesity   . Onychomycosis   . ONYCHOMYCOSIS 09/07/2006   Qualifier: Diagnosis of  By: Cletus Gash MD, [place]    . Panic disorder   . Peyronie's disease   . Prostatitis, chronic   . Right bundle branch block   . S/P coronary angioplasty   . Stroke ([place])   . Tinnitus     Patient Active Problem List   Diagnosis Date Noted  . UTI (urinary tract infection) 09/01/2017  . Chronic anticoagulation with Xarelto 12/29/2016  . Iron deficiency anemia  due to chronic blood loss 12/29/2016  . PAF (paroxysmal atrial fibrillation) ([place]) 09/22/2016  . TIA (transient ischemic attack) 12/14/2014  . CVA (cerebral infarction) 12/14/2014  . DM type 2, uncontrolled, with renal complications ([place]) 52/77/8242  . History of colonic polyps 04/14/2014  . Cerumen impaction 02/10/2014  . 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 Diabetes mellitus, type 2 ([place]) 08/09/2013  . Routine general medical examination at a health care facility 07/18/2013  . Benign prostatic hyperplasia with urinary obstruction 02/06/2013  . Amnesia 02/06/2013  . Insomnia, persistent 02/06/2013  . Back ache 02/01/2012  . CAD (coronary artery disease) 12/27/2010  . Obese 12/27/2010  . Anemia, deficiency 08/10/2010  . ONYCHOMYCOSIS 09/07/2006  . BOWEN'S DISEASE 09/07/2006  . DIABETES MELLITUS, TYPE II 09/07/2006  . Dyslipidemia 09/07/2006  . PANIC DISORDER 09/07/2006  . SEXUAL DYSFUNCTION 09/07/2006  . TINNITUS 09/07/2006  . Essential hypertension 09/07/2006  . 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 VERTIGO 09/07/2006    Past Surgical History:  Procedure Laterality Date  . CATARACT EXTRACTION W/ INTRAOCULAR LENS  IMPLANT, BILATERAL    . CHOLECYSTECTOMY    . COLONOSCOPY  03/09/2004   Normal  . CORONARY ANGIOPLASTY  01/02/01   Acute inferior MI -- Stent placement in the proximal RCA (three separate stents totalling approximately 55 mm of stent with residual thrombus in  the posterior descending artery) --  Relatively mild inferior basilar hypokinesia --Severe stenosis in a secondary marginal vessel of the left circumflex -- Minimal coronary atherosclerosis otherwise of the coronary system   . Nuclear stress test  Oct 2011    Dense inferior infarct; mild ischemia with dilated LV. EF was 39%. Study was felt to be unchanged and he has been managed medically        Home Medications    Prior to  Admission medications   Medication Sig Start Date End Date Taking? Authorizing Provider  albuterol (PROVENTIL HFA) 108 (90 Base) MCG/ACT inhaler Inhale 2 puffs into the lungs every 6 (six) hours as needed for wheezing or shortness of breath.  09/06/16  Yes [provider]  aspirin EC 81 MG tablet Take 81 mg by mouth.   Yes [provider]  Cyanocobalamin (VITAMIN B-12 PO) Take 1 tablet by mouth daily.     Yes [provider]  doxazosin (CARDURA) 8 MG tablet Take 8 mg by mouth daily.  07/31/12  Yes [provider]  ergocalciferol (VITAMIN D2) 50000 units capsule Take 50,000 Units by mouth once a week.   Yes [provider]  ezetimibe (ZETIA) 10 MG tablet Take 10 mg by mouth daily.    Yes [provider]  ferrous sulfate 325 (65 FE) MG tablet Take 325 mg by mouth daily with breakfast.   Yes [provider]  furosemide (LASIX) 20 MG tablet Take 20 mg by mouth daily.    Yes [provider]  gabapentin (NEURONTIN) 300 MG capsule Take 600 mg by mouth 2 (two) times daily.    Yes [provider]  glipiZIDE (GLUCOTROL) 5 MG tablet Take 5 mg by mouth 2 (two) times daily before a meal.   Yes [provider]  insulin glargine (LANTUS) 100 UNIT/ML injection Inject 56 Units into the skin 2 (two) times daily.    Yes [provider]  isosorbide mononitrate (IMDUR) 30 MG 24 hr tablet TAKE 2 TABLETS PO DAILY 05/31/13  Yes Minus Breeding, MD  lisinopril (PRINIVIL,ZESTRIL) 20 MG tablet Take 1 tablet (20 mg total) by mouth daily. 01/27/12  Yes Burtis Junes, NP  metoprolol succinate (TOPROL-XL) 25 MG 24 hr tablet Take 12.5 mg by mouth daily.   Yes [provider]  nitroGLYCERIN (NITROSTAT) 0.4 MG SL tablet Place 1 tablet (0.4 mg total) under the tongue every 5 (five) minutes as needed. 10/15/12  Yes Minus Breeding, MD  Rivaroxaban (XARELTO) 15 MG TABS tablet Take 1 tablet by mouth once daily with a meal 02/12/15   Yes Minus Breeding, MD  Semaglutide 1 MG/DOSE SOPN Inject 1.35m once a week 05/15/17  Yes [provider]  senna-docusate (SENOKOT-S) 8.6-50 MG tablet Take 1 tablet by mouth as needed for mild constipation.   Yes [provider]  simvastatin (ZOCOR) 40 MG tablet Take 40 mg by mouth daily.   Yes [provider]  temazepam (RESTORIL) 15 MG capsule Take 15 mg by mouth at bedtime as needed for sleep.   Yes [provider]  Blood Glucose Monitoring Suppl (FIFTY50 GLUCOSE METER 2.0) W/DEVICE KIT by Does not apply route.    [provider]  metoprolol tartrate (LOPRESSOR) 25 MG tablet Take by mouth.    [provider]  [facility] test [facility]  01/27/15   [provider]    Family History Family History  Problem Relation Age of Onset  . Heart attack Father   . Heart  disease Father   . Diabetes Sister   . Transient ischemic attack Sister   . Colon cancer Neg Hx     Social History Social History   Tobacco Use  . Smoking status: Former Smoker    Types: Cigarettes    Last attempt to quit: 11/17/1981    Years since quitting: 35.8  . Smokeless tobacco: Never Used  Substance Use Topics  . Alcohol use: No    Alcohol/week: 0.0 oz  . Drug use: No     Allergies   Patient has no known allergies.   Review of Systems Review of Systems  Constitutional: Negative for chills and fever.  HENT: Negative for ear pain and sore throat.   Eyes: Negative for pain and visual disturbance.  Respiratory: Negative for cough and shortness of breath.   Cardiovascular: Negative for chest pain and palpitations.  Gastrointestinal: Negative for abdominal pain, nausea and vomiting.  Genitourinary: Positive for dysuria, frequency and hesitancy. Negative for flank pain, hematuria, scrotal swelling, testicular pain and urgency.  Musculoskeletal: Negative for arthralgias and back pain.  Skin: Negative for color change and rash.  Neurological: Negative  for seizures and syncope.  All other systems reviewed and are negative.    Physical Exam Updated Vital Signs ED Triage Vitals  Enc Vitals Group     BP 09/01/17 1305 126/64     Pulse Rate 09/01/17 1305 (!) 110     Resp 09/01/17 1305 17     Temp 09/01/17 1305 98.7 F (37.1 C)     Temp src --      SpO2 09/01/17 1305 98 %     Weight 09/01/17 1307 288 lb (130.6 kg)     Height 09/01/17 1307 '5\' 9"'  (1.753 m)     Head Circumference --      Peak Flow --      Pain Score 09/01/17 1305 6     Pain Loc --      Pain Edu? --      Excl. in [place]? --     Physical Exam  Constitutional: He is oriented to person, place, and time. He appears well-developed and well-nourished.  HENT:  Head: Normocephalic and atraumatic.  Right Ear: External ear normal.  Left Ear: External ear normal.  Eyes: Conjunctivae and EOM are normal.  Neck: Normal range of motion. Neck supple.  Cardiovascular: Normal rate, regular rhythm, normal heart sounds and intact distal pulses.  No murmur heard. Pulmonary/Chest: Effort normal and breath sounds normal. No respiratory distress.  Abdominal: Soft. Bowel sounds are normal. He exhibits no distension. There is tenderness (TTP in the suprapubic area, no CVA tenderness).  Musculoskeletal: Normal range of motion. He exhibits no edema or tenderness.  Neurological: He is alert and oriented to person, place, and time.  Skin: Skin is warm and dry. Capillary refill takes less than 2 seconds. No pallor.  Psychiatric: He has a normal mood and affect.  Nursing note and vitals reviewed.    ED Treatments / Results  Labs (all labs ordered are listed, but only abnormal results are displayed) Labs Reviewed  URINALYSIS, ROUTINE W REFLEX MICROSCOPIC - Abnormal; Notable for the following components:      Result Value   APPearance HAZY (*)    Leukocytes, UA MODERATE (*)    Bacteria, UA RARE (*)    All other components within normal limits  BASIC METABOLIC PANEL - Abnormal; Notable for  the following components:   Sodium 134 (*)    Chloride 100 (*)  CO2 20 (*)    Glucose, Bld 187 (*)    BUN 23 (*)    Creatinine, Ser 2.11 (*)    GFR calc non Af Amer 29 (*)    GFR calc Af Amer 34 (*)    All other components within normal limits  CBC - Abnormal; Notable for the following components:   WBC 18.4 (*)    All other components within normal limits  URINE CULTURE  CULTURE, BLOOD (ROUTINE X 2)  CULTURE, BLOOD (ROUTINE X 2)  I-STAT CG4 LACTIC ACID, ED    EKG None  Radiology No results found.  Procedures Procedures (including critical care time)  Medications Ordered in ED Medications  lactated ringers bolus 1,000 mL (has no administration in time range)  cefTRIAXone (ROCEPHIN) 1 g in sodium chloride 0.9 % 100 mL IVPB (has no administration in time range)     Initial Impression / Assessment and Plan / ED Course  I have reviewed the triage vital signs and the nursing notes.  Pertinent labs & imaging results that were available during my care of the patient were reviewed by me and considered in my medical decision making (see chart for details).     Jesse Wheeler is a 77 year old male with history of hypertension, diabetes, TIA/stroke who presents to the ED with dysuria, urinary frequency.  Patient with tachycardia upon arrival but otherwise normal vitals.  Patient with symptoms for the last 2 days.  Denies any fever, cough, sputum production.  Patient has no history of urinary tract infections or kidney stones.  Denies any flank pain, nausea, vomiting.  Patient has had some suprapubic abdominal pain.  Overall patient appears well but has some mild tenderness on suprapubic abdominal exam.  Patient has no CVA tenderness.  Does appear clinically dehydrated as he does have some dry mucous membranes.  Suspect tachycardia from dehydration.  Patient had basic labs drawn and urinalysis ordered prior to my evaluation that are consistent with a urinary tract infection.  Patient  has mild elevation in creatinine likely from dehydration.  Patient otherwise no significant electrolyte abnormalities.  Patient does have a leukocytosis of 18 and concern for sepsis from urinary tract infection.  Urine culture ordered, blood cultures ordered and patient given IV Rocephin.  Will give patient LR bolus as well as lactic acid mildly elevated.  Code sepsis called. CXR wnl. Patient to be admitted to medicine for further care.  Hemodynamically stable throughout my care.  Final Clinical Impressions(s) / ED Diagnoses   Final diagnoses:  Sepsis, due to unspecified organism [facility])  Cystitis  Urinary frequency  AKI (acute kidney injury) [facility])    ED Discharge Orders    None       Lennice Sites, DO 09/01/17 [place], [place], DO 09/01/17 1715    Tanna Furry, MD 09/01/17 2022    Tanna Furry, MD 09/17/17 [phone]

## 2017-09-01 NOTE — Progress Notes (Signed)
Spoke with patient's wife and she verified that patient is a DNR. Purple DNR band applied to patient's wrist.

## 2017-09-01 NOTE — Progress Notes (Signed)
Patient transferred from ED to 5W25. Patient A&O 2-3 and disoriented to time. Telemetry box 07 applied and second verified. Skin assessment completed by 2 RNs. MASD in groin and bottom.  Patient instructed how to use the callbell before getting out of bed. Callbell within reach. Admission handbook given to patient. Will continue to monitor and treat per MD orders.

## 2017-09-01 NOTE — ED Notes (Signed)
Code sepsis activated RN Gerald Stabs aware

## 2017-09-01 NOTE — Progress Notes (Signed)
Received report from [facility] in the ED

## 2017-09-01 NOTE — H&P (Signed)
History and Physical  Jesse Wheeler UJW:[id number] DOB: Oct 17, 1940 DOA: 09/01/2017  PCP: [facility]   Chief Complaint: frequent urination  HPI:  77 year old man PMH coronary artery disease, diabetes, chronic prostatitis per chart although the patient denied, presented to the emergency department with several days of urinary frequency, hesitancy and pain.  Found to have UTI with early sepsis, acute kidney injury.  History obtained from patient and wife at bedside.  For the last few days he has had urinary frequency, hesitancy and some pain.  He denies previous UTI.  Chart shows history of chronic prostatitis although the patient reported no prostate problems.  He does appear to have some memory loss at baseline.  He denies any low back pain currently although he may have had some recently although he cannot characterize it further.  No abdominal pain.  No fever chills or other systemic symptoms.  ED Course: Afebrile tachycardic, tachypneic but normotensive.  Lactic acid elevated, WBC elevated, creatinine elevated.  Treated with ceftriaxone.  Review of Systems:  Negative for fever, chills, night sweats, new visual changes, sore throat, rash, new muscle aches, chest pain, SOB, bleeding, n/v/abdominal pain.  Has been eating well.  Past Medical History:  Diagnosis Date  . Actinic keratoses 08/09/2013   Overview:  IMPRESSION: Liquid nitrogen x2   . Acute inferior myocardial infarction ([place])    stenting in 2002 to the RCA  . Bowen's disease   . Coronary artery disease   . Diabetes mellitus    type II  . Dupuytren's contracture of left hand   . History of colonic polyps 04/14/2014  . Hypercholesterolemia   . Hyperlipidemia   . Hypertension    LCX  . Obesity   . Onychomycosis   . ONYCHOMYCOSIS 09/07/2006   Qualifier: Diagnosis of  By: Cletus Gash MD, [place]    . Panic disorder   . Peyronie's disease   . Prostatitis, chronic   . Right bundle branch block   . S/P coronary  angioplasty   . Stroke ([place])   . Tinnitus     Past Surgical History:  Procedure Laterality Date  . CATARACT EXTRACTION W/ INTRAOCULAR LENS  IMPLANT, BILATERAL    . CHOLECYSTECTOMY    . COLONOSCOPY  03/09/2004   Normal  . CORONARY ANGIOPLASTY  01/02/01   Acute inferior MI -- Stent placement in the proximal RCA (three separate stents totalling approximately 55 mm of stent with residual thrombus in the posterior descending artery) --  Relatively mild inferior basilar hypokinesia --Severe stenosis in a secondary marginal vessel of the left circumflex -- Minimal coronary atherosclerosis otherwise of the coronary system   . Nuclear stress test  Oct 2011    Dense inferior infarct; mild ischemia with dilated LV. EF was 39%. Study was felt to be unchanged and he has been managed medically     reports that he quit smoking about 35 years ago. His smoking use included cigarettes. He has never used smokeless tobacco. He reports that he does not drink alcohol or use drugs.   No Known Allergies  Family History  Problem Relation Age of Onset  . Heart attack Father   . Heart disease Father   . Diabetes Sister   . Transient ischemic attack Sister   . Colon cancer Neg Hx      Prior to Admission medications   Medication Sig Start Date End Date Taking? Authorizing Provider  albuterol (PROVENTIL HFA) 108 (90 Base) MCG/ACT inhaler Inhale 2 puffs into the lungs every  6 (six) hours as needed for wheezing or shortness of breath.  09/06/16  Yes [provider]  aspirin EC 81 MG tablet Take 81 mg by mouth.   Yes [provider]  Cyanocobalamin (VITAMIN B-12 PO) Take 1 tablet by mouth daily.     Yes [provider]  doxazosin (CARDURA) 8 MG tablet Take 8 mg by mouth daily.  07/31/12  Yes [provider]  ergocalciferol (VITAMIN D2) 50000 units capsule Take 50,000 Units by mouth once a week.   Yes [provider]  ezetimibe (ZETIA) 10 MG tablet Take 10 mg by mouth  daily.    Yes [provider]  ferrous sulfate 325 (65 FE) MG tablet Take 325 mg by mouth daily with breakfast.   Yes [provider]  furosemide (LASIX) 20 MG tablet Take 20 mg by mouth daily.    Yes [provider]  gabapentin (NEURONTIN) 300 MG capsule Take 600 mg by mouth 2 (two) times daily.    Yes [provider]  glipiZIDE (GLUCOTROL) 5 MG tablet Take 5 mg by mouth 2 (two) times daily before a meal.   Yes [provider]  insulin glargine (LANTUS) 100 UNIT/ML injection Inject 56 Units into the skin 2 (two) times daily.    Yes [provider]  isosorbide mononitrate (IMDUR) 30 MG 24 hr tablet TAKE 2 TABLETS PO DAILY 05/31/13  Yes Minus Breeding, MD  lisinopril (PRINIVIL,ZESTRIL) 20 MG tablet Take 1 tablet (20 mg total) by mouth daily. 01/27/12  Yes Burtis Junes, NP  metoprolol succinate (TOPROL-XL) 25 MG 24 hr tablet Take 12.5 mg by mouth daily.   Yes [provider]  nitroGLYCERIN (NITROSTAT) 0.4 MG SL tablet Place 1 tablet (0.4 mg total) under the tongue every 5 (five) minutes as needed. 10/15/12  Yes Minus Breeding, MD  Rivaroxaban (XARELTO) 15 MG TABS tablet Take 1 tablet by mouth once daily with a meal 02/12/15  Yes Minus Breeding, MD  Semaglutide 1 MG/DOSE SOPN Inject 1.49m once a week 05/15/17  Yes [provider]  senna-docusate (SENOKOT-S) 8.6-50 MG tablet Take 1 tablet by mouth as needed for mild constipation.   Yes [provider]  simvastatin (ZOCOR) 40 MG tablet Take 40 mg by mouth daily.   Yes [provider]  temazepam (RESTORIL) 15 MG capsule Take 15 mg by mouth at bedtime as needed for sleep.   Yes [provider]  Blood Glucose Monitoring Suppl (FIFTY50 GLUCOSE METER 2.0) W/DEVICE KIT by Does not apply route.    [provider]  [facility] test strip  01/27/15   [provider]    Physical Exam:  Constitutional:   Appears calm and comfortable Eyes:    pupils and irises appear normal Normal lids  ENMT:  grossly normal hearing  Oropharynx: mucosa, tongue, appear unremarkable Neck:  neck appears normal, no masses no thyromegaly Respiratory:  CTA bilaterally, no w/r/r.  Respiratory effort normal.  Cardiovascular:  RRR, no m/r/g No LE extremity edema   Abdomen:  Abdomen appears normal; no tenderness or masses No hepatomegaly Musculoskeletal:  RUE, LUE strength and tone normal, no atrophy, no abnormal movements Skin:  No rashes, lesions, ulcers palpation of skin: no induration or nodules Psychiatric:  Mental status Mood, affect appropriate judgement and insight appears intact  I have personally reviewed following labs and imaging studies  Labs:   Potassium within normal limits.  CO2 depressed, 20.  BUN 23, creatinine 2.11, baseline appears to be 1.5  or so.  Lactic acid 2.25  WBC 18.4, remainder CBC unremarkable  Urinalysis grossly positive with too numerous to count WBC.  Urine culture has been sent  Imaging studies:   Chest x-ray independently reviewed, no acute disease   Principal Problem:   Sepsis secondary to UTI [facility]) Active Problems:   DM type 2, uncontrolled, with renal complications ([place])   AKI (acute kidney injury) ([place])   CKD (chronic kidney disease), stage III ([place])   Benign essential HTN   Assessment/Plan UTI with early sepsis by criteria.  Currently normotensive. --Normotensive tachycardic.  Empiric antibiotics.  Fluids.  Appears stable for telemetry. --Trend lactic acid.  Sepsis protocol.  Acute kidney injury superimposed on chronic kidney disease stage III --Presumably secondary to acute infection, early sepsis, there also may be a post renal element.   -- IV fluids.  BMP in a.m.  Diabetes mellitus type 2 --Stable.  Random glucose 187.  Anion gap within normal limits. --Continue Lantus.  Add sliding scale insulin.  PMH stroke --Stable.  Continue Xarelto, statin  Essential  hypertension --Stable.  Continue beta-blocker.  Hyperlipidemia --Continue statin  Severity of Illness: The appropriate patient status for this patient is INPATIENT. Inpatient status is judged to be reasonable and necessary in order to provide the required intensity of service to ensure the patient's safety. The patient's presenting symptoms, physical exam findings, and initial radiographic and laboratory data in the context of their chronic comorbidities is felt to place them at high risk for further clinical deterioration. Furthermore, it is not anticipated that the patient will be medically stable for discharge from the hospital within 2 midnights of admission. The following factors support the patient status of inpatient.   See assessment and plan above  * I certify that at the point of admission it is my clinical judgment that the patient will require inpatient hospital care spanning beyond 2 midnights from the point of admission due to high intensity of service, high risk for further deterioration and high frequency of surveillance required.*   DVT prophylaxis: Xarelto Code Status: DNR Family Communication: wife at bedside Consults called: none    Time spent: 37 minutes  Murray Hodgkins, MD  [facility] Direct contact: [phone] --[place] [place]  --[email or web address]; password [email or web address]  7PM-7AM contact night coverage as above  09/01/2017, 6:15 PM

## 2017-09-01 NOTE — ED Triage Notes (Signed)
Pt. Stated, Im peeing allot and it hurts when I pee

## 2017-09-02 DIAGNOSIS — D696 Thrombocytopenia, unspecified: Secondary | ICD-10-CM

## 2017-09-02 LAB — CBC
HEMATOCRIT: 39.3 % (ref 39.0–52.0)
HEMOGLOBIN: 13.1 g/dL (ref 13.0–17.0)
MCH: 31.8 pg (ref 26.0–34.0)
MCHC: 33.3 g/dL (ref 30.0–36.0)
MCV: 95.4 fL (ref 78.0–100.0)
Platelets: 128 10*3/uL — ABNORMAL LOW (ref 150–400)
RBC: 4.12 MIL/uL — ABNORMAL LOW (ref 4.22–5.81)
RDW: 14.4 % (ref 11.5–15.5)
WBC: 14.9 10*3/uL — AB (ref 4.0–10.5)

## 2017-09-02 LAB — BASIC METABOLIC PANEL
ANION GAP: 10 (ref 5–15)
BUN: 22 mg/dL — ABNORMAL HIGH (ref 6–20)
CHLORIDE: 104 mmol/L (ref 101–111)
CO2: 20 mmol/L — ABNORMAL LOW (ref 22–32)
Calcium: 7.9 mg/dL — ABNORMAL LOW (ref 8.9–10.3)
Creatinine, Ser: 2 mg/dL — ABNORMAL HIGH (ref 0.61–1.24)
GFR, EST AFRICAN AMERICAN: 36 mL/min — AB (ref >60)
GFR, EST NON AFRICAN AMERICAN: 31 mL/min — AB (ref >60)
Glucose, Bld: 220 mg/dL — ABNORMAL HIGH (ref 65–99)
POTASSIUM: 4.4 mmol/L (ref 3.5–5.1)
SODIUM: 134 mmol/L — AB (ref 135–145)

## 2017-09-02 LAB — GLUCOSE, CAPILLARY
GLUCOSE-CAPILLARY: 208 mg/dL — AB (ref 65–99)
GLUCOSE-CAPILLARY: 214 mg/dL — AB (ref 65–99)
Glucose-Capillary: 205 mg/dL — ABNORMAL HIGH (ref 65–99)

## 2017-09-02 LAB — LACTIC ACID, PLASMA: Lactic Acid, Venous: 1.9 mmol/L (ref 0.5–1.9)

## 2017-09-02 MED ORDER — LACTATED RINGERS IV SOLN
INTRAVENOUS | Status: DC
  Administered 2017-09-02 – 2017-09-03 (×3): via INTRAVENOUS

## 2017-09-02 NOTE — Progress Notes (Signed)
PROGRESS NOTE  ARIC JOST YHC:[id number] DOB: 07-08-1940 DOA: 09/01/2017 PCP: [facility]  Brief Narrative: 77 year old man PMH coronary artery disease, diabetes, chronic prostatitis per chart although the patient denied, presented to the emergency department with several days of urinary frequency, hesitancy and pain.  Found to have UTI with early sepsis, acute kidney injury.  Assessment/Plan UTI with early sepsis by criteria.   --Hemodynamics stable.  Lactic acid is normalized.  Sepsis appears resolved.  Was again febrile last night.  Continue empiric antibiotics, follow culture data.    Acute kidney injury superimposed on chronic kidney disease stage III.  Secondary to sepsis.  Possible post renal element. --Improved.  Continue IV fluids, recheck basic metabolic panel in the morning.  Thrombocytopenia.  Secondary to sepsis. --Check CBC in a.m.  Expect to improve as condition improves.  Diabetes mellitus type 2 --Stable.  Continue Lantus, sliding scale insulin, glipizide.  PMH stroke --Stable.    Continue Xarelto, statin  Essential hypertension --Stable.    Continue beta-blocker.  Hyperlipidemia --Continue statin   Overall improving.  Anticipate discharge once afebrile for at least 24 hours, renal function improved and platelets have normalized.  DVT prophylaxis: Xarelto Code Status: DNR Family Communication: wife at bedside  Disposition Plan: home likely next 35 hours    Murray Hodgkins, MD  [facility] Direct contact: [phone] --[place]ia [place]  --[email or web address]; password [email or web address]  7PM-7AM contact night coverage as above 09/02/2017, 10:10 AM  LOS: 1 day   Consultants:    Procedures:    Antimicrobials:  Ceftriaxone 4/19 >>  Interval history/Subjective: Feels better today.  Fair appetite.  Diffuse myalgias.  Less pain with urination.  Objective: Vitals:  Vitals:   09/02/17 0628 09/02/17 0820  BP: (!) 106/54 113/64  Pulse: 94  85  Resp: (!) 32 17  Temp: 100.2 F (37.9 C) 99 F (37.2 C)  SpO2: 93% 94%    Exam:  Constitutional:  . Appears calm and comfortable Eyes:  . pupils and irises appear normal . Normal lids  ENMT:  . grossly normal hearing  . Lips appear normal Respiratory:  . CTA bilaterally, no w/r/r.  . Respiratory effort normal Cardiovascular:  . RRR, no m/r/g . No LE extremity edema   Abdomen:  . Soft Skin:  . No rashes, lesions, ulcers Psychiatric:  . Mental status o Mood, affect appropriate  I have personally reviewed the following:   Labs:  Blood sugars stable.  BUN and creatinine trending down, 22/2.0.  Potassium within normal limits.  Anion gap within normal limits.  Lactic acid is normalized.  WBC trending down, 14.9.  Hemoglobin within normal limits.  Platelets down, 128.  Scheduled Meds: . aspirin EC  81 mg Oral Daily  . doxazosin  8 mg Oral Daily  . furosemide  20 mg Oral Daily  . gabapentin  600 mg Oral BID  . glipiZIDE  5 mg Oral BID AC  . insulin aspart  0-9 Units Subcutaneous TID WC  . insulin glargine  56 Units Subcutaneous BID  . isosorbide mononitrate  60 mg Oral Daily  . metoprolol succinate  12.5 mg Oral Daily  . Rivaroxaban  15 mg Oral Q supper  . simvastatin  40 mg Oral QHS  . sodium chloride flush  3 mL Intravenous Q12H   Continuous Infusions: . cefTRIAXone (ROCEPHIN)  IV    . lactated ringers      Principal Problem:   Sepsis secondary to UTI [facility]) Active Problems:   DM type  2, uncontrolled, with renal complications ([place])   AKI (acute kidney injury) ([place])   CKD (chronic kidney disease), stage III ([name])   Benign essential HTN   Thrombocytopenia ([place])   LOS: 1 day

## 2017-09-03 LAB — BASIC METABOLIC PANEL
ANION GAP: 10 (ref 5–15)
BUN: 25 mg/dL — ABNORMAL HIGH (ref 6–20)
CALCIUM: 8.1 mg/dL — AB (ref 8.9–10.3)
CHLORIDE: 105 mmol/L (ref 101–111)
CO2: 20 mmol/L — ABNORMAL LOW (ref 22–32)
CREATININE: 1.89 mg/dL — AB (ref 0.61–1.24)
GFR calc non Af Amer: 33 mL/min — ABNORMAL LOW (ref >60)
GFR, EST AFRICAN AMERICAN: 38 mL/min — AB (ref >60)
Glucose, Bld: 138 mg/dL — ABNORMAL HIGH (ref 65–99)
Potassium: 3.8 mmol/L (ref 3.5–5.1)
SODIUM: 135 mmol/L (ref 135–145)

## 2017-09-03 LAB — CBC
HCT: 36.4 % — ABNORMAL LOW (ref 39.0–52.0)
HEMOGLOBIN: 12.5 g/dL — AB (ref 13.0–17.0)
MCH: 32.5 pg (ref 26.0–34.0)
MCHC: 34.3 g/dL (ref 30.0–36.0)
MCV: 94.5 fL (ref 78.0–100.0)
PLATELETS: 122 10*3/uL — AB (ref 150–400)
RBC: 3.85 MIL/uL — AB (ref 4.22–5.81)
RDW: 14.3 % (ref 11.5–15.5)
WBC: 8.9 10*3/uL (ref 4.0–10.5)

## 2017-09-03 LAB — GLUCOSE, CAPILLARY
GLUCOSE-CAPILLARY: 161 mg/dL — AB (ref 65–99)
Glucose-Capillary: 260 mg/dL — ABNORMAL HIGH (ref 65–99)

## 2017-09-03 LAB — URINE CULTURE: Culture: >=100000 — AB

## 2017-09-03 MED ORDER — CEPHALEXIN 500 MG PO CAPS: 500.0000 mg | ORAL_CAPSULE | Freq: Two times a day (BID) | ORAL | Status: DC

## 2017-09-03 MED ORDER — CEPHALEXIN 500 MG PO CAPS: 500.0000 mg | ORAL_CAPSULE | Freq: Two times a day (BID) | ORAL | 0 refills | Status: AC

## 2017-09-03 NOTE — Discharge Summary (Addendum)
Physician Discharge Summary  SAMBA CUMBA STM:[id number] DOB: [birth year] DOA: 09/01/2017  PCP: [facility]  Admit date: 09/01/2017 Discharge date: 09/03/2017  Admitted From: Home Disposition: Home  Recommendations for Outpatient Follow-up:  1. Follow up with PCP in 1 week 2. Please obtain BMP/CBC in one week 3. Please follow up on the following pending results: blood culture  Home Health: None Equipment/Devices: NOne  Discharge Condition: Stable CODE STATUS: DNR Diet recommendation: Heart healthy/carb modified   Brief/Interim Summary:  Admission HPI written by Samuella Cota, MD   Chief Complaint: frequent urination  HPI:  77 year old man PMH coronary artery disease, diabetes, chronic prostatitis per chart although the patient denied, presented to the emergency department with several days of urinary frequency, hesitancy and pain.  Found to have UTI with early sepsis, acute kidney injury.  History obtained from patient and wife at bedside.  For the last few days he has had urinary frequency, hesitancy and some pain.  He denies previous UTI.  Chart shows history of chronic prostatitis although the patient reported no prostate problems.  He does appear to have some memory loss at baseline.  He denies any low back pain currently although he may have had some recently although he cannot characterize it further.  No abdominal pain.  No fever chills or other systemic symptoms.  ED Course: Afebrile tachycardic, tachypneic but normotensive.  Lactic acid elevated, WBC elevated, creatinine elevated.  Treated with ceftriaxone.    Hospital course:  UTI Sepsis secondary to UTI Patient empirically treated with ceftriaxone. Urine culture significant for E. Coli. Blood culture no growth to date. Patient with prostate disease, predisposing him to urinary tract infection. Afebrile x24 hours. Leukocytosis resolved. Transitioned to Keflex, renally adjusted. Follow-up with  urologist and PCP as an outpatient.  Acute kidney injury on CKD III On chart review, baseline creatinine appears to be around 1.8. 2.11 on admission which improved with IV fluids.  Thrombocytopenia Likely secondary to sepsis/infection. Stable. Repeat CBC as an outpatient. No evidence of bleeding.  Diabetes mellitus, uncontrolled with hyperglycemia Continued Lantus and started sliding scale insulin while inpatient. Resume home regimen as an outpatient. Consider discontinuing glipizide in setting of subcutaneous insulin usage to reduce the risk of hypoglycemia.  History of stroke Continued Xarelto and simvastatin  Essential hypertension Stable. Continued metoprolol  Hyperlipidemia Continued simvastatin  Discharge Diagnoses:  Principal Problem:   Sepsis secondary to UTI [facility]) Active Problems:   DM type 2, uncontrolled, with renal complications ([place])   AKI (acute kidney injury) ([place])   CKD (chronic kidney disease), stage III ([place])   Benign essential HTN   Thrombocytopenia ([place])    Discharge Instructions  Discharge Instructions    Diet - low sodium heart healthy   Complete by:  As directed    Increase activity slowly   Complete by:  As directed      Allergies as of 09/03/2017   No Known Allergies     Medication List    TAKE these medications   aspirin EC 81 MG tablet Take 81 mg by mouth.   cephALEXin 500 MG capsule Commonly known as:  KEFLEX Take 1 capsule (500 mg total) by mouth 2 (two) times daily for 5 days.   doxazosin 8 MG tablet Commonly known as:  CARDURA Take 8 mg by mouth daily.   ergocalciferol 50000 units capsule Commonly known as:  VITAMIN D2 Take 50,000 Units by mouth once a week.   ezetimibe 10 MG tablet Commonly known as:  ZETIA Take 10 mg by mouth daily.   ferrous sulfate 325 (65 FE) MG tablet Take 325 mg by mouth daily with breakfast.   FIFTY50 GLUCOSE METER 2.0 w/Device Kit by Does not apply route.   furosemide 20 MG  tablet Commonly known as:  LASIX Take 20 mg by mouth daily.   gabapentin 300 MG capsule Commonly known as:  NEURONTIN Take 600 mg by mouth 2 (two) times daily.   glipiZIDE 5 MG tablet Commonly known as:  GLUCOTROL Take 5 mg by mouth 2 (two) times daily before a meal.   insulin glargine 100 UNIT/ML injection Commonly known as:  LANTUS Inject 56 Units into the skin 2 (two) times daily.   isosorbide mononitrate 30 MG 24 hr tablet Commonly known as:  IMDUR TAKE 2 TABLETS PO DAILY   lisinopril 20 MG tablet Commonly known as:  PRINIVIL,ZESTRIL Take 1 tablet (20 mg total) by mouth daily.   metoprolol succinate 25 MG 24 hr tablet Commonly known as:  TOPROL-XL Take 12.5 mg by mouth daily.   nitroGLYCERIN 0.4 MG SL tablet Commonly known as:  NITROSTAT Place 1 tablet (0.4 mg total) under the tongue every 5 (five) minutes as needed.   ONETOUCH VERIO test strip Generic drug:  glucose blood   PROVENTIL HFA 108 (90 Base) MCG/ACT inhaler Generic drug:  albuterol Inhale 2 puffs into the lungs every 6 (six) hours as needed for wheezing or shortness of breath.   Rivaroxaban 15 MG Tabs tablet Commonly known as:  XARELTO Take 1 tablet by mouth once daily with a meal   Semaglutide 1 MG/DOSE Sopn Inject 1.52m once a week   senna-docusate 8.6-50 MG tablet Commonly known as:  Senokot-S Take 1 tablet by mouth as needed for mild constipation.   simvastatin 40 MG tablet Commonly known as:  ZOCOR Take 40 mg by mouth daily.   temazepam 15 MG capsule Commonly known as:  RESTORIL Take 15 mg by mouth at bedtime as needed for sleep.   VITAMIN B-12 PO Take 1 tablet by mouth daily.      Follow-up Information    [facility] Schedule an appointment as soon as possible for a visit in 1 week(s).   Contact information: [phone]         No Known Allergies  Consultations:  None   Procedures/Studies: Dg  Chest Portable 1 View  Result Date: 09/01/2017 CLINICAL DATA:  Sepsis EXAM: PORTABLE CHEST 1 VIEW COMPARISON:  12/14/2014 FINDINGS: The heart size and mediastinal contours are within normal limits. Both lungs are clear. The visualized skeletal structures are unremarkable. IMPRESSION: No active disease. Electronically Signed   By: KUlyses JarredM.D.   On: 09/01/2017 17:29      Subjective: Some unsteadiness when getting up to use the restroom today. Also some incontinence secondary to being unable to get to the restroom.  Discharge Exam: Vitals:   09/02/17 2209 09/03/17 0541  BP: 129/72 139/73  Pulse: 87 89  Resp: 19 (!) 21  Temp: 98.9 F (37.2 C) 98.3 F (36.8 C)  SpO2: 97%    Vitals:   09/02/17 0628 09/02/17 0820 09/02/17 2209 09/03/17 0541  BP: (!) 106/54 113/64 129/72 139/73  Pulse: 94 85 87 89  Resp: (!) 32 17 19 (!) 21  Temp: 100.2 F (37.9 C) 99 F (37.2 C) 98.9 F (37.2 C) 98.3 F (36.8 C)  TempSrc: Oral  Oral Oral  SpO2: 93% 94% 97%  Weight:      Height:        General: Pt is alert, awake, not in acute distress Cardiovascular: RRR, S1/S2 +, no rubs, no gallops Respiratory: CTA bilaterally, no wheezing, no rhonchi Abdominal: Soft, NT, ND, bowel sounds + Extremities: no edema, no cyanosis    The results of significant diagnostics from this hospitalization (including imaging, microbiology, ancillary and laboratory) are listed below for reference.     Microbiology: Recent Results (from the past 240 hour(s))  Urine culture     Status: Abnormal   Collection Time: 09/01/17  1:29 PM  Result Value Ref Range Status   Specimen Description URINE, CLEAN CATCH  Final   Special Requests   Final    NONE Performed at [facility], [address], [place], [place] [address]    Culture >=100,000 COLONIES/mL ESCHERICHIA COLI (A)  Final   Report Status 09/03/2017 FINAL  Final   Organism ID, Bacteria ESCHERICHIA COLI (A)  Final      Susceptibility   Escherichia  coli - MIC*    AMPICILLIN >=32 RESISTANT Resistant     CEFAZOLIN 8 SENSITIVE Sensitive     CEFTRIAXONE <=1 SENSITIVE Sensitive     CIPROFLOXACIN <=0.25 SENSITIVE Sensitive     GENTAMICIN <=1 SENSITIVE Sensitive     IMIPENEM <=0.25 SENSITIVE Sensitive     NITROFURANTOIN 64 INTERMEDIATE Intermediate     TRIMETH/SULFA <=20 SENSITIVE Sensitive     AMPICILLIN/SULBACTAM >=32 RESISTANT Resistant     PIP/TAZO 8 SENSITIVE Sensitive     Extended ESBL NEGATIVE Sensitive     * >=100,000 COLONIES/mL ESCHERICHIA COLI  Blood culture (routine x 2)     Status: None (Preliminary result)   Collection Time: 09/01/17  4:50 PM  Result Value Ref Range Status   Specimen Description BLOOD BLOOD RIGHT HAND  Final   Special Requests   Final    BOTTLES DRAWN AEROBIC AND ANAEROBIC Blood Culture adequate volume   Culture   Final    NO GROWTH < 24 HOURS Performed at [facility], [address] [place], [place] [address]    Report Status PENDING  Incomplete  Blood culture (routine x 2)     Status: None (Preliminary result)   Collection Time: 09/01/17  5:24 PM  Result Value Ref Range Status   Specimen Description BLOOD RIGHT ANTECUBITAL  Final   Special Requests   Final    BOTTLES DRAWN AEROBIC AND ANAEROBIC Blood Culture adequate volume   Culture   Final    NO GROWTH < 24 HOURS Performed at [facility], [place] [address], [place], [place] [address]    Report Status PENDING  Incomplete     Labs: BNP (last 3 results) No results for input(s): BNP in the last 8760 hours. Basic Metabolic Panel: Recent Labs  Lab 09/01/17 1310 09/02/17 0106 09/03/17 0418  NA 134* 134* 135  K 4.2 4.4 3.8  CL 100* 104 105  CO2 20* 20* 20*  GLUCOSE 187* 220* 138*  BUN 23* 22* 25*  CREATININE 2.11* 2.00* 1.89*  CALCIUM 9.3 7.9* 8.1*   Liver Function Tests: No results for input(s): AST, ALT, ALKPHOS, BILITOT, PROT, ALBUMIN in the last 168 hours. No results for input(s): LIPASE, AMYLASE in the last 168  hours. No results for input(s): AMMONIA in the last 168 hours. CBC: Recent Labs  Lab 09/01/17 1310 09/02/17 0106 09/03/17 0418  WBC 18.4* 14.9* 8.9  HGB 15.5 13.1 12.5*  HCT 45.9 39.3  36.4*  MCV 95.6 95.4 94.5  PLT 156 128* 122*   Cardiac Enzymes: No results for input(s): CKTOTAL, CKMB, CKMBINDEX, TROPONINI in the last 168 hours. BNP: Invalid input(s): POCBNP CBG: Recent Labs  Lab 09/01/17 2243 09/02/17 0818 09/02/17 1710 09/02/17 2210 09/03/17 0812  GLUCAP 222* 205* 208* 214* 161*   D-Dimer No results for input(s): DDIMER in the last 72 hours. Hgb A1c No results for input(s): HGBA1C in the last 72 hours. Lipid Profile No results for input(s): CHOL, HDL, LDLCALC, TRIG, CHOLHDL, LDLDIRECT in the last 72 hours. Thyroid function studies No results for input(s): TSH, T4TOTAL, T3FREE, THYROIDAB in the last 72 hours.  Invalid input(s): FREET3 Anemia work up No results for input(s): VITAMINB12, FOLATE, FERRITIN, TIBC, IRON, RETICCTPCT in the last 72 hours. Urinalysis    Component Value Date/Time   COLORURINE YELLOW 09/01/2017 1329   APPEARANCEUR HAZY (A) 09/01/2017 1329   LABSPEC 1.017 09/01/2017 1329   PHURINE 5.0 09/01/2017 1329   GLUCOSEU NEGATIVE 09/01/2017 1329   HGBUR NEGATIVE 09/01/2017 1329   BILIRUBINUR NEGATIVE 09/01/2017 1329   KETONESUR NEGATIVE 09/01/2017 1329   PROTEINUR NEGATIVE 09/01/2017 1329   UROBILINOGEN 0.2 12/14/2014 1650   NITRITE NEGATIVE 09/01/2017 1329   LEUKOCYTESUR MODERATE (A) 09/01/2017 1329   Sepsis Labs Invalid input(s): PROCALCITONIN,  WBC,  LACTICIDVEN Microbiology Recent Results (from the past 240 hour(s))  Urine culture     Status: Abnormal   Collection Time: 09/01/17  1:29 PM  Result Value Ref Range Status   Specimen Description URINE, CLEAN CATCH  Final   Special Requests   Final    NONE Performed at [facility], [place] [address], [place], [place] [address]    Culture >=100,000 COLONIES/mL ESCHERICHIA COLI (A)   Final   Report Status 09/03/2017 FINAL  Final   Organism ID, Bacteria ESCHERICHIA COLI (A)  Final      Susceptibility   Escherichia coli - MIC*    AMPICILLIN >=32 RESISTANT Resistant     CEFAZOLIN 8 SENSITIVE Sensitive     CEFTRIAXONE <=1 SENSITIVE Sensitive     CIPROFLOXACIN <=0.25 SENSITIVE Sensitive     GENTAMICIN <=1 SENSITIVE Sensitive     IMIPENEM <=0.25 SENSITIVE Sensitive     NITROFURANTOIN 64 INTERMEDIATE Intermediate     TRIMETH/SULFA <=20 SENSITIVE Sensitive     AMPICILLIN/SULBACTAM >=32 RESISTANT Resistant     PIP/TAZO 8 SENSITIVE Sensitive     Extended ESBL NEGATIVE Sensitive     * >=100,000 COLONIES/mL ESCHERICHIA COLI  Blood culture (routine x 2)     Status: None (Preliminary result)   Collection Time: 09/01/17  4:50 PM  Result Value Ref Range Status   Specimen Description BLOOD BLOOD RIGHT HAND  Final   Special Requests   Final    BOTTLES DRAWN AEROBIC AND ANAEROBIC Blood Culture adequate volume   Culture   Final    NO GROWTH < 24 HOURS Performed at [facility], [address], [place], [place] [address]    Report Status PENDING  Incomplete  Blood culture (routine x 2)     Status: None (Preliminary result)   Collection Time: 09/01/17  5:24 PM  Result Value Ref Range Status   Specimen Description BLOOD RIGHT ANTECUBITAL  Final   Special Requests   Final    BOTTLES DRAWN AEROBIC AND ANAEROBIC Blood Culture adequate volume   Culture   Final    NO GROWTH < 24 HOURS Performed at [facility], [place] [address]  [place], [place] [address]    Report Status PENDING  Incomplete     SIGNED:   Cordelia Poche, MD [facility] 09/03/2017, 12:15 PM Pager [phone]  If 7PM-7AM, please contact night-coverage [email or web address] Password [email or web address]

## 2017-09-03 NOTE — Progress Notes (Signed)
Nsg Discharge Note  Admit Date:  09/01/2017 Discharge date: 09/03/2017   SILVER PARKEY to be D/C'd Home per MD order.  AVS completed.  Copy for chart, and copy for patient signed, and dated. Patient/caregiver able to verbalize understanding.  Discharge Medication: Allergies as of 09/03/2017   No Known Allergies     Medication List    TAKE these medications   aspirin EC 81 MG tablet Take 81 mg by mouth.   cephALEXin 500 MG capsule Commonly known as:  KEFLEX Take 1 capsule (500 mg total) by mouth 2 (two) times daily for 5 days.   doxazosin 8 MG tablet Commonly known as:  CARDURA Take 8 mg by mouth daily.   ergocalciferol 50000 units capsule Commonly known as:  VITAMIN D2 Take 50,000 Units by mouth once a week.   ezetimibe 10 MG tablet Commonly known as:  ZETIA Take 10 mg by mouth daily.   ferrous sulfate 325 (65 FE) MG tablet Take 325 mg by mouth daily with breakfast.   FIFTY50 GLUCOSE METER 2.0 w/Device Kit by Does not apply route.   furosemide 20 MG tablet Commonly known as:  LASIX Take 20 mg by mouth daily.   gabapentin 300 MG capsule Commonly known as:  NEURONTIN Take 600 mg by mouth 2 (two) times daily.   glipiZIDE 5 MG tablet Commonly known as:  GLUCOTROL Take 5 mg by mouth 2 (two) times daily before a meal.   insulin glargine 100 UNIT/ML injection Commonly known as:  LANTUS Inject 56 Units into the skin 2 (two) times daily.   isosorbide mononitrate 30 MG 24 hr tablet Commonly known as:  IMDUR TAKE 2 TABLETS PO DAILY   lisinopril 20 MG tablet Commonly known as:  PRINIVIL,ZESTRIL Take 1 tablet (20 mg total) by mouth daily.   metoprolol succinate 25 MG 24 hr tablet Commonly known as:  TOPROL-XL Take 12.5 mg by mouth daily.   nitroGLYCERIN 0.4 MG SL tablet Commonly known as:  NITROSTAT Place 1 tablet (0.4 mg total) under the tongue every 5 (five) minutes as needed.   ONETOUCH VERIO test strip Generic drug:  glucose blood   PROVENTIL HFA 108  (90 Base) MCG/ACT inhaler Generic drug:  albuterol Inhale 2 puffs into the lungs every 6 (six) hours as needed for wheezing or shortness of breath.   Rivaroxaban 15 MG Tabs tablet Commonly known as:  XARELTO Take 1 tablet by mouth once daily with a meal   Semaglutide 1 MG/DOSE Sopn Inject 1.65m once a week   senna-docusate 8.6-50 MG tablet Commonly known as:  Senokot-S Take 1 tablet by mouth as needed for mild constipation.   simvastatin 40 MG tablet Commonly known as:  ZOCOR Take 40 mg by mouth daily.   temazepam 15 MG capsule Commonly known as:  RESTORIL Take 15 mg by mouth at bedtime as needed for sleep.   VITAMIN B-12 PO Take 1 tablet by mouth daily.            Durable Medical Equipment  (From admission, onward)        Start     Ordered   09/03/17 1221  For home use only DME Bedside commode  Once    Question Answer Comment  Patient needs a bedside commode to treat with the following condition UTI (urinary tract infection)   Patient needs a bedside commode to treat with the following condition BPH (benign prostatic hyperplasia)   Patient needs a bedside commode to treat with the following condition  Urge incontinence      09/03/17 1220      Discharge Assessment: Vitals:   09/03/17 0541 09/03/17 1309  BP: 139/73 125/73  Pulse: 89 82  Resp: (!) 21 20  Temp: 98.3 F (36.8 C) 98.1 F (36.7 C)  SpO2:  97%   Skin clean, dry and intact without evidence of skin break down, no evidence of skin tears noted. IV catheter discontinued intact. Site without signs and symptoms of complications - no redness or edema noted at insertion site, patient denies c/o pain - only slight tenderness at site.  Dressing with slight pressure applied.  D/c Instructions-Education: Discharge instructions given to patient/family with verbalized understanding. D/c education completed with patient/family including follow up instructions, medication list, d/c activities limitations if  indicated, with other d/c instructions as indicated by MD - patient able to verbalize understanding, all questions fully answered. Patient instructed to return to ED, call 911, or call MD for any changes in condition.  Patient escorted via [place], and D/C home via private auto.  Salley Slaughter, RN 09/03/2017 1:24 PM

## 2017-09-03 NOTE — Evaluation (Signed)
Physical Therapy Evaluation Patient Details Name: CASHEL BELLINA MRN: [id number] DOB: April 30, 1941 Today's Date: 09/03/2017   History of Present Illness  Pt adm with sepsis due to UTI.PMH - DM, HTN, rt CVA, CAD, MI  Clinical Impression  Pt doing well with mobility and no further PT needed.  Ready for dc from PT standpoint.      Follow Up Recommendations No PT follow up    Equipment Recommendations  None recommended by PT    Recommendations for Other Services       Precautions / Restrictions Precautions Precautions: None Restrictions Weight Bearing Restrictions: No      Mobility  Bed Mobility Overal bed mobility: Modified Independent                Transfers Overall transfer level: Independent Equipment used: None                Ambulation/Gait Ambulation/Gait assistance: Independent Ambulation Distance (Feet): 325 Feet Assistive device: None Gait Pattern/deviations: Step-through pattern;Wide base of support   Gait velocity interpretation: >2.62 ft/sec, indicative of community ambulatory General Gait Details: Steady gait  Stairs            Wheelchair Mobility    Modified Rankin (Stroke Patients Only)       Balance                                             Pertinent Vitals/Pain Pain Assessment: No/denies pain    Home Living Family/patient expects to be discharged to:: Private residence Living Arrangements: Spouse/significant other Available Help at Discharge: Family;Available 24 hours/day Type of Home: House Home Access: Stairs to enter Entrance Stairs-Rails: Left Entrance Stairs-Number of Steps: 4 Home Layout: One level Home Equipment: None      Prior Function Level of Independence: Independent               Hand Dominance   Dominant Hand: Left    Extremity/Trunk Assessment   Upper Extremity Assessment Upper Extremity Assessment: Overall WFL for tasks assessed    Lower Extremity  Assessment Lower Extremity Assessment: Overall WFL for tasks assessed       Communication   Communication: No difficulties  Cognition Arousal/Alertness: Awake/alert Behavior During Therapy: WFL for tasks assessed/performed Overall Cognitive Status: Within Functional Limits for tasks assessed                                        General Comments      Exercises     Assessment/Plan    PT Assessment Patent does not need any further PT services  PT Problem List         PT Treatment Interventions      PT Goals (Current goals can be found in the Care Plan section)  Acute Rehab PT Goals PT Goal Formulation: All assessment and education complete, DC therapy    Frequency     Barriers to discharge        Co-evaluation               AM-PAC PT "6 Clicks" Daily Activity  Outcome Measure Difficulty turning over in bed (including adjusting bedclothes, sheets and blankets)?: None Difficulty moving from lying on back to sitting on the side of the bed? : None Difficulty sitting  down on and standing up from a chair with arms (e.g., wheelchair, bedside commode, etc,.)?: None Help needed moving to and from a bed to chair (including a wheelchair)?: None Help needed walking in hospital room?: None Help needed climbing 3-5 steps with a railing? : None 6 Click Score: 24    End of Session   Activity Tolerance: Patient tolerated treatment well Patient left: in chair;with call bell/phone within reach;with family/visitor present Nurse Communication: Mobility status PT Visit Diagnosis: Other abnormalities of gait and mobility (R26.89)    Time: 1050-1102 PT Time Calculation (min) (ACUTE ONLY): 12 min   Charges:   PT Evaluation $PT Eval Low Complexity: 1 Low     PT G CodesMarland Kitchen        [facility] PT [place] 09/03/2017, 11:18 AM

## 2017-09-04 LAB — GLUCOSE, CAPILLARY: Glucose-Capillary: 245 mg/dL — ABNORMAL HIGH (ref 65–99)

## 2017-09-06 LAB — CULTURE, BLOOD (ROUTINE X 2)
Culture: NO GROWTH
Culture: NO GROWTH
SPECIAL REQUESTS: ADEQUATE
Special Requests: ADEQUATE

## 2017-09-14 ENCOUNTER — Ambulatory Visit: Payer: Medicare Other | Admitting: Adult Health

## 2017-11-02 ENCOUNTER — Other Ambulatory Visit (INDEPENDENT_AMBULATORY_CARE_PROVIDER_SITE_OTHER): Payer: Medicare Other

## 2017-11-02 DIAGNOSIS — D649 Anemia, unspecified: Secondary | ICD-10-CM

## 2017-11-02 LAB — CBC WITH DIFFERENTIAL/PLATELET
Basophils Absolute: 0.1 10*3/uL (ref 0.0–0.1)
Basophils Relative: 1.3 % (ref 0.0–3.0)
Eosinophils Absolute: 0.1 10*3/uL (ref 0.0–0.7)
Eosinophils Relative: 1.1 % (ref 0.0–5.0)
HCT: 45.1 % (ref 39.0–52.0)
Hemoglobin: 15.2 g/dL (ref 13.0–17.0)
LYMPHS ABS: 2 10*3/uL (ref 0.7–4.0)
Lymphocytes Relative: 26.4 % (ref 12.0–46.0)
MCHC: 33.6 g/dL (ref 30.0–36.0)
MCV: 95 fl (ref 78.0–100.0)
MONO ABS: 0.6 10*3/uL (ref 0.1–1.0)
Monocytes Relative: 8 % (ref 3.0–12.0)
NEUTROS PCT: 63.2 % (ref 43.0–77.0)
Neutro Abs: 4.7 10*3/uL (ref 1.4–7.7)
Platelets: 159 10*3/uL (ref 150.0–400.0)
RBC: 4.75 Mil/uL (ref 4.22–5.81)
RDW: 13.8 % (ref 11.5–15.5)
WBC: 7.5 10*3/uL (ref 4.0–10.5)

## 2017-11-02 LAB — FERRITIN: FERRITIN: 41.6 ng/mL (ref 22.0–322.0)

## 2017-11-04 NOTE — Progress Notes (Signed)
Hgb and iron level ok Ask him if he has any info on the spot on his lung [facility] was [facility] and we need to try to get copies of any CT scans of any body parts he has had at [place] from 2018-present I intended to do so after last vist but do not think that has happened  Schedule REV next avail

## 2018-01-17 ENCOUNTER — Ambulatory Visit: Payer: Medicare Other | Admitting: Internal Medicine

## 2018-01-17 ENCOUNTER — Encounter: Payer: Self-pay | Admitting: Internal Medicine

## 2018-01-17 DIAGNOSIS — D500 Iron deficiency anemia secondary to blood loss (chronic): Secondary | ICD-10-CM | POA: Diagnosis not present

## 2018-01-17 NOTE — Patient Instructions (Signed)
Please call us when your done with your eye issues and we will set you up for a capsule endoscopy.   I appreciate the opportunity to care for you. Silvano Rusk, MD, [facility]

## 2018-01-17 NOTE — Assessment & Plan Note (Addendum)
capsule endoscopy of the small bowel will be performed to see if we can find a cause for his blood loss anemia there.  Further plans pending those results.

## 2018-01-20 ENCOUNTER — Encounter: Payer: Self-pay | Admitting: Internal Medicine

## 2018-01-20 NOTE — Progress Notes (Signed)
GREGREY BLOYD 77 y.o. 30-May-1940 [id number]  Assessment & Plan:   Iron deficiency anemia due to chronic blood loss capsule endoscopy of the small bowel will be performed to see if we can find a cause for his blood loss anemia there.  Further plans pending those results.   I appreciate the opportunity to care for this patient. CC: [facility], [facility]   Subjective:   Chief Complaint: Iron deficiency anemia  HPI The patient is here with his wife because of iron deficiency anemia.  Etiology not clear, colonoscopy did not show a cause in 2015, EGD showed some small superficial gastric ulcers in 2018.  He has been treated with iron supplementation.  He has had CT scanning of his chest on a regular basis because of lung nodules, I never did get the reports but the [place] has told him they are stable and do not think they are cancer.  His iron is actually normalized as of June as well as his hemoglobin.  He is feeling better.  He remains on Xarelto. No Known Allergies Current Meds  Medication Sig  . aspirin EC 81 MG tablet Take 81 mg by mouth.  . Blood Glucose Monitoring Suppl (FIFTY50 GLUCOSE METER 2.0) W/DEVICE KIT by Does not apply route.  . Cyanocobalamin (VITAMIN B-12 PO) Take 1 tablet by mouth daily.    Marland Kitchen doxazosin (CARDURA) 8 MG tablet Take 8 mg by mouth daily.   . ergocalciferol (VITAMIN D2) 50000 units capsule Take 50,000 Units by mouth once a week.  . ezetimibe (ZETIA) 10 MG tablet Take 10 mg by mouth daily.   . ferrous sulfate 325 (65 FE) MG tablet Take 325 mg by mouth daily with breakfast.  . furosemide (LASIX) 20 MG tablet Take 20 mg by mouth daily.   Marland Kitchen gabapentin (NEURONTIN) 300 MG capsule Take 600 mg by mouth 3 (three) times daily.   . insulin aspart (NOVOLOG) 100 UNIT/ML injection Inject 6 Units into the skin 3 (three) times daily with meals.  . insulin glargine (LANTUS) 100 UNIT/ML injection Inject 30 Units into the skin 2 (two) times daily.   . isosorbide mononitrate  (IMDUR) 30 MG 24 hr tablet TAKE 2 TABLETS PO DAILY  . lisinopril (PRINIVIL,ZESTRIL) 20 MG tablet Take 1 tablet (20 mg total) by mouth daily.  . metoprolol succinate (TOPROL-XL) 25 MG 24 hr tablet Take 12.5 mg by mouth daily.  Glory Rosebush VERIO test strip   . Rivaroxaban (XARELTO) 15 MG TABS tablet Take 1 tablet by mouth once daily with a meal  . Semaglutide (OZEMPIC) 1 MG/DOSE SOPN Inject 1 mg into the skin once a week. [place]  . senna-docusate (SENOKOT-S) 8.6-50 MG tablet Take 1 tablet by mouth as needed for mild constipation.  . simvastatin (ZOCOR) 40 MG tablet Take 40 mg by mouth daily.  . temazepam (RESTORIL) 15 MG capsule Take 15 mg by mouth at bedtime as needed for sleep.   Past Medical History:  Diagnosis Date  . Actinic keratoses 08/09/2013   Overview:  IMPRESSION: Liquid nitrogen x2   . 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 Anemia   . Bowen's disease   . Coronary artery disease   . Diabetes mellitus    type II  . Dupuytren's contracture of left hand   . History of colonic polyps 04/14/2014  . Hypercholesterolemia   . Hyperlipidemia   . Hypertension    LCX  . Obesity   .  Onychomycosis   . ONYCHOMYCOSIS 09/07/2006   Qualifier: Diagnosis of  By: Cletus Gash MD, [place]    . Panic disorder   . Peyronie's disease   . Prostatitis, chronic   . Right bundle branch block   . S/P coronary angioplasty   . Stroke ([place])   . Tinnitus    Past Surgical History:  Procedure Laterality Date  . CATARACT EXTRACTION W/ INTRAOCULAR LENS  IMPLANT, BILATERAL    . CHOLECYSTECTOMY    . COLONOSCOPY  03/09/2004   Normal  . CORONARY ANGIOPLASTY  01/02/01   Acute inferior MI -- Stent placement in the proximal RCA (three separate stents totalling approximately 55 mm of stent with residual thrombus in the posterior descending artery) --  Relatively mild inferior basilar hypokinesia --Severe stenosis in a secondary marginal vessel of the left circumflex -- Minimal  coronary atherosclerosis otherwise of the coronary system   . Nuclear stress test  Oct 2011    Dense inferior infarct; mild ischemia with dilated LV. EF was 39%. Study was felt to be unchanged and he has been managed medically   Social History   Social History Narrative   He is a retired Firefighter, he also served as a Psychiatrist.   Daily caffeine drinks    He is married and he has 1 son   family history includes Diabetes in his sister; Heart attack in his father; Heart disease in his father; Transient ischemic attack in his sister.   Review of Systems As above  Objective:   Physical Exam BP (!) 100/52 (BP Location: Left Arm, Patient Position: Sitting, Cuff Size: Normal)   Pulse 92 Comment: irregular  Ht 5' 8.5" (1.74 m)   Wt 287 lb 2 oz (130.2 kg)   BMI 43.02 kg/m  NAD  15 minutes time spent with patient > half in counseling coordination of care

## 2018-03-09 ENCOUNTER — Telehealth: Payer: Self-pay | Admitting: Internal Medicine

## 2018-03-09 ENCOUNTER — Other Ambulatory Visit: Payer: Self-pay | Admitting: Orthopedic Surgery

## 2018-03-09 DIAGNOSIS — D500 Iron deficiency anemia secondary to blood loss (chronic): Secondary | ICD-10-CM

## 2018-03-09 NOTE — Telephone Encounter (Signed)
Attempted to return call to patient; unable to leave message (no machine); will attempt at later date/time;

## 2018-03-13 NOTE — Telephone Encounter (Signed)
Left message for patient to call back  

## 2018-03-13 NOTE — Telephone Encounter (Signed)
Patient is scheduled with his wife for 03/22/18.  I mailed him instructions.  They will call me if they have any questions once they receive the instructions.

## 2018-03-13 NOTE — Addendum Note (Signed)
Addended by: Marlon Pel on: 03/13/2018 01:37 PM   Modules accepted: Orders

## 2018-03-22 ENCOUNTER — Ambulatory Visit (INDEPENDENT_AMBULATORY_CARE_PROVIDER_SITE_OTHER): Payer: Medicare Other | Admitting: Internal Medicine

## 2018-03-22 ENCOUNTER — Encounter: Payer: Self-pay | Admitting: Internal Medicine

## 2018-03-22 DIAGNOSIS — D500 Iron deficiency anemia secondary to blood loss (chronic): Secondary | ICD-10-CM

## 2018-03-22 NOTE — Progress Notes (Signed)
Patient here for capsule endoscopy. He is accompanied by his spouse. Procedure explained. Questions invited and answered. Verbalizes understanding of written and verbal instructions. Patient swallows capsule without difficulty.  [place] [facility] [id number].[place] 2019-06-09

## 2018-04-04 ENCOUNTER — Telehealth: Payer: Self-pay

## 2018-04-04 NOTE — Telephone Encounter (Signed)
Received a email from [facility] that the cpsule ID associated with Mr. Sahlin was unable to be downloaded.  They are sending a replacement capsule.

## 2018-04-15 HISTORY — PX: EYE SURGERY: SHX253

## 2018-04-19 NOTE — Telephone Encounter (Signed)
Patient has been rescheduled for 05/14/18 8:00.  His wife is aware. I will send new instructions.

## 2018-04-25 ENCOUNTER — Encounter (HOSPITAL_BASED_OUTPATIENT_CLINIC_OR_DEPARTMENT_OTHER): Payer: Self-pay | Admitting: *Deleted

## 2018-04-25 ENCOUNTER — Other Ambulatory Visit: Payer: Self-pay

## 2018-04-26 ENCOUNTER — Encounter (HOSPITAL_BASED_OUTPATIENT_CLINIC_OR_DEPARTMENT_OTHER)
Admission: RE | Admit: 2018-04-26 | Discharge: 2018-04-26 | Disposition: A | Discharge status: Home / Self Care | Payer: Medicare Other | Source: Ambulatory Visit | Attending: Orthopedic Surgery | Admitting: Orthopedic Surgery

## 2018-04-26 DIAGNOSIS — I10 Essential (primary) hypertension: Secondary | ICD-10-CM | POA: Diagnosis not present

## 2018-04-26 DIAGNOSIS — I4510 Unspecified right bundle-branch block: Secondary | ICD-10-CM | POA: Insufficient documentation

## 2018-04-26 DIAGNOSIS — Z01818 Encounter for other preprocedural examination: Secondary | ICD-10-CM | POA: Insufficient documentation

## 2018-04-26 LAB — BASIC METABOLIC PANEL
Anion gap: 12 (ref 5–15)
BUN: 29 mg/dL — ABNORMAL HIGH (ref 8–23)
CO2: 20 mmol/L — ABNORMAL LOW (ref 22–32)
Calcium: 9 mg/dL (ref 8.9–10.3)
Chloride: 104 mmol/L (ref 98–111)
Creatinine, Ser: 2.34 mg/dL — ABNORMAL HIGH (ref 0.61–1.24)
GFR calc Af Amer: 30 mL/min — ABNORMAL LOW (ref >60)
GFR calc non Af Amer: 26 mL/min — ABNORMAL LOW (ref >60)
GLUCOSE: 203 mg/dL — AB (ref 70–99)
Potassium: 5.4 mmol/L — ABNORMAL HIGH (ref 3.5–5.1)
Sodium: 136 mmol/L (ref 135–145)

## 2018-04-26 NOTE — Progress Notes (Addendum)
EKG reviewed by Dr. Marcie Bal, instructed pt to clarify instructions for Xarelto with physician, notified Elmo Putt at Dr. Levell July office, will proceed with surgery as scheduled.  Lab results reviewed by Dr. Nyoka Cowden, will obtain ISTAT day of surgery, notified Dr. Levell July office (spoke with Jeani Hawking) that the patient will need to see his primary care physician before surgery due to elevated creatinine and BUN per Dr.Green.

## 2018-04-30 NOTE — Progress Notes (Signed)
Spoke with Odessa Fleming from Dr Levell July office and she stated that she spoke with patient's PCP Dr Coletta Memos about pt's elevated BUN/CR, and he stated that pt was OK to proceed with surgery. Jeani Hawking states she will fax me a clearance note from that office.

## 2018-05-01 ENCOUNTER — Ambulatory Visit (HOSPITAL_BASED_OUTPATIENT_CLINIC_OR_DEPARTMENT_OTHER): Payer: No Typology Code available for payment source | Admitting: Anesthesiology

## 2018-05-01 ENCOUNTER — Encounter (HOSPITAL_BASED_OUTPATIENT_CLINIC_OR_DEPARTMENT_OTHER): Payer: Self-pay | Admitting: Certified Registered"

## 2018-05-01 ENCOUNTER — Other Ambulatory Visit: Payer: Self-pay

## 2018-05-01 ENCOUNTER — Encounter (HOSPITAL_BASED_OUTPATIENT_CLINIC_OR_DEPARTMENT_OTHER)
Admission: RE | Disposition: A | Discharge status: Home / Self Care | Payer: Self-pay | Source: Home / Self Care | Attending: Orthopedic Surgery

## 2018-05-01 ENCOUNTER — Ambulatory Visit (HOSPITAL_BASED_OUTPATIENT_CLINIC_OR_DEPARTMENT_OTHER)
Admission: RE | Admit: 2018-05-01 | Discharge: 2018-05-01 | Disposition: A | Discharge status: Home / Self Care | Payer: No Typology Code available for payment source | Attending: Orthopedic Surgery | Admitting: Orthopedic Surgery

## 2018-05-01 DIAGNOSIS — Z955 Presence of coronary angioplasty implant and graft: Secondary | ICD-10-CM | POA: Diagnosis not present

## 2018-05-01 DIAGNOSIS — Z794 Long term (current) use of insulin: Secondary | ICD-10-CM | POA: Insufficient documentation

## 2018-05-01 DIAGNOSIS — E119 Type 2 diabetes mellitus without complications: Secondary | ICD-10-CM | POA: Diagnosis not present

## 2018-05-01 DIAGNOSIS — I252 Old myocardial infarction: Secondary | ICD-10-CM | POA: Insufficient documentation

## 2018-05-01 DIAGNOSIS — M720 Palmar fascial fibromatosis [Dupuytren]: Secondary | ICD-10-CM | POA: Insufficient documentation

## 2018-05-01 DIAGNOSIS — I10 Essential (primary) hypertension: Secondary | ICD-10-CM | POA: Insufficient documentation

## 2018-05-01 DIAGNOSIS — Z8673 Personal history of transient ischemic attack (TIA), and cerebral infarction without residual deficits: Secondary | ICD-10-CM | POA: Insufficient documentation

## 2018-05-01 DIAGNOSIS — M20091 Other deformity of right finger(s): Secondary | ICD-10-CM | POA: Diagnosis present

## 2018-05-01 DIAGNOSIS — Z6841 Body Mass Index (BMI) 40.0 and over, adult: Secondary | ICD-10-CM | POA: Insufficient documentation

## 2018-05-01 DIAGNOSIS — Z87891 Personal history of nicotine dependence: Secondary | ICD-10-CM | POA: Insufficient documentation

## 2018-05-01 DIAGNOSIS — M65341 Trigger finger, right ring finger: Secondary | ICD-10-CM | POA: Insufficient documentation

## 2018-05-01 DIAGNOSIS — Z7901 Long term (current) use of anticoagulants: Secondary | ICD-10-CM | POA: Insufficient documentation

## 2018-05-01 DIAGNOSIS — I2510 Atherosclerotic heart disease of native coronary artery without angina pectoris: Secondary | ICD-10-CM | POA: Diagnosis not present

## 2018-05-01 HISTORY — PX: FASCIECTOMY: SHX6525

## 2018-05-01 HISTORY — PX: TRIGGER FINGER RELEASE: SHX641

## 2018-05-01 LAB — POCT I-STAT, CHEM 8
BUN: 29 mg/dL — ABNORMAL HIGH (ref 8–23)
Calcium, Ion: 1.07 mmol/L — ABNORMAL LOW (ref 1.15–1.40)
Chloride: 104 mmol/L (ref 98–111)
Creatinine, Ser: 2 mg/dL — ABNORMAL HIGH (ref 0.61–1.24)
Glucose, Bld: 144 mg/dL — ABNORMAL HIGH (ref 70–99)
HEMATOCRIT: 45 % (ref 39.0–52.0)
Hemoglobin: 15.3 g/dL (ref 13.0–17.0)
Potassium: 4.7 mmol/L (ref 3.5–5.1)
Sodium: 136 mmol/L (ref 135–145)
TCO2: 22 mmol/L (ref 22–32)

## 2018-05-01 LAB — GLUCOSE, CAPILLARY: Glucose-Capillary: 120 mg/dL — ABNORMAL HIGH (ref 70–99)

## 2018-05-01 SURGERY — FASCIECTOMY, PALM
Anesthesia: Monitor Anesthesia Care | Site: Hand | Laterality: Right

## 2018-05-01 MED ORDER — CEFAZOLIN SODIUM-DEXTROSE 2-4 GM/100ML-% IV SOLN
INTRAVENOUS | Status: AC
  Filled 2018-05-01: qty 100

## 2018-05-01 MED ORDER — FENTANYL CITRATE (PF) 100 MCG/2ML IJ SOLN
INTRAMUSCULAR | Status: AC
  Filled 2018-05-01: qty 2

## 2018-05-01 MED ORDER — ONDANSETRON HCL 4 MG/2ML IJ SOLN: 4.0000 mg | Freq: Once | INTRAMUSCULAR | Status: DC | PRN

## 2018-05-01 MED ORDER — CEFAZOLIN SODIUM-DEXTROSE 2-4 GM/100ML-% IV SOLN
2.0000 g | INTRAVENOUS | Status: AC
  Administered 2018-05-01: 2 g via INTRAVENOUS

## 2018-05-01 MED ORDER — METOPROLOL TARTRATE 25 MG PO TABS
ORAL_TABLET | ORAL | Status: AC
  Filled 2018-05-01: qty 1

## 2018-05-01 MED ORDER — ROPIVACAINE HCL 7.5 MG/ML IJ SOLN
INTRAMUSCULAR | Status: DC | PRN
  Administered 2018-05-01: 40 mL via PERINEURAL

## 2018-05-01 MED ORDER — HYDROCODONE-ACETAMINOPHEN 5-325 MG PO TABS: 1.0000 | ORAL_TABLET | Freq: Four times a day (QID) | ORAL | 0 refills | Status: DC | PRN

## 2018-05-01 MED ORDER — FENTANYL CITRATE (PF) 100 MCG/2ML IJ SOLN: 25.0000 ug | INTRAMUSCULAR | Status: DC | PRN

## 2018-05-01 MED ORDER — CHLORHEXIDINE GLUCONATE 4 % EX LIQD: 60.0000 mL | Freq: Once | CUTANEOUS | Status: DC

## 2018-05-01 MED ORDER — SCOPOLAMINE 1 MG/3DAYS TD PT72: 1.0000 | MEDICATED_PATCH | Freq: Once | TRANSDERMAL | Status: DC | PRN

## 2018-05-01 MED ORDER — MIDAZOLAM HCL 2 MG/2ML IJ SOLN
1.0000 mg | INTRAMUSCULAR | Status: DC | PRN
  Administered 2018-05-01: 1 mg via INTRAVENOUS

## 2018-05-01 MED ORDER — METOPROLOL TARTRATE 25 MG PO TABS
25.0000 mg | ORAL_TABLET | Freq: Once | ORAL | Status: AC
  Administered 2018-05-01: 25 mg via ORAL

## 2018-05-01 MED ORDER — THROMBIN 5000 UNITS EX SOLR
CUTANEOUS | Status: DC | PRN
  Administered 2018-05-01: 5000 [IU] via TOPICAL

## 2018-05-01 MED ORDER — ONDANSETRON HCL 4 MG/2ML IJ SOLN
INTRAMUSCULAR | Status: DC | PRN
  Administered 2018-05-01: 4 mg via INTRAVENOUS

## 2018-05-01 MED ORDER — THROMBIN 5000 UNITS EX SOLR
CUTANEOUS | Status: AC
  Filled 2018-05-01: qty 5000

## 2018-05-01 MED ORDER — BUPIVACAINE HCL (PF) 0.25 % IJ SOLN
INTRAMUSCULAR | Status: AC
  Filled 2018-05-01: qty 180

## 2018-05-01 MED ORDER — MIDAZOLAM HCL 2 MG/2ML IJ SOLN
INTRAMUSCULAR | Status: AC
  Filled 2018-05-01: qty 2

## 2018-05-01 MED ORDER — LACTATED RINGERS IV SOLN
INTRAVENOUS | Status: DC
  Administered 2018-05-01: 08:00:00 via INTRAVENOUS

## 2018-05-01 MED ORDER — FENTANYL CITRATE (PF) 100 MCG/2ML IJ SOLN
50.0000 ug | INTRAMUSCULAR | Status: DC | PRN
  Administered 2018-05-01: 50 ug via INTRAVENOUS

## 2018-05-01 MED ORDER — PROPOFOL 500 MG/50ML IV EMUL
INTRAVENOUS | Status: DC | PRN
  Administered 2018-05-01: 50 ug/kg/min via INTRAVENOUS

## 2018-05-01 MED ORDER — BUPIVACAINE HCL (PF) 0.25 % IJ SOLN
INTRAMUSCULAR | Status: DC | PRN
  Administered 2018-05-01: 10 mL

## 2018-05-01 SURGICAL SUPPLY — 51 items
BANDAGE COBAN STERILE 2 (GAUZE/BANDAGES/DRESSINGS) IMPLANT
BLADE MINI RND TIP GREEN BEAV (BLADE) ×4 IMPLANT
BLADE SURG 15 STRL LF DISP TIS (BLADE) ×2 IMPLANT
BLADE SURG 15 STRL SS (BLADE) ×2
BNDG COHESIVE 3X5 TAN STRL LF (GAUZE/BANDAGES/DRESSINGS) ×4 IMPLANT
BNDG ESMARK 4X9 LF (GAUZE/BANDAGES/DRESSINGS) ×4 IMPLANT
BNDG GAUZE ELAST 4 BULKY (GAUZE/BANDAGES/DRESSINGS) ×4 IMPLANT
CHLORAPREP W/TINT 26ML (MISCELLANEOUS) ×4 IMPLANT
CORD BIPOLAR FORCEPS 12FT (ELECTRODE) ×4 IMPLANT
COVER BACK TABLE 60X90IN (DRAPES) ×4 IMPLANT
COVER MAYO STAND STRL (DRAPES) ×4 IMPLANT
COVER WAND RF STERILE (DRAPES) IMPLANT
CUFF TOURNIQUET SINGLE 18IN (TOURNIQUET CUFF) IMPLANT
CUFF TOURNIQUET SINGLE 24IN (TOURNIQUET CUFF) ×4 IMPLANT
DECANTER SPIKE VIAL GLASS SM (MISCELLANEOUS) ×4 IMPLANT
DRAPE EXTREMITY T 121X128X90 (DRAPE) ×4 IMPLANT
DRAPE SURG 17X23 STRL (DRAPES) ×4 IMPLANT
DRSG PAD ABDOMINAL 8X10 ST (GAUZE/BANDAGES/DRESSINGS) IMPLANT
GAUZE SPONGE 4X4 12PLY STRL (GAUZE/BANDAGES/DRESSINGS) ×4 IMPLANT
GAUZE XEROFORM 1X8 LF (GAUZE/BANDAGES/DRESSINGS) ×4 IMPLANT
GLOVE BIOGEL PI IND STRL 7.0 (GLOVE) ×4 IMPLANT
GLOVE BIOGEL PI IND STRL 8.5 (GLOVE) ×2 IMPLANT
GLOVE BIOGEL PI INDICATOR 7.0 (GLOVE) ×4
GLOVE BIOGEL PI INDICATOR 8.5 (GLOVE) ×2
GLOVE ECLIPSE 6.5 STRL STRAW (GLOVE) ×4 IMPLANT
GLOVE SURG ORTHO 8.0 STRL STRW (GLOVE) ×4 IMPLANT
GOWN STRL REUS W/ TWL LRG LVL3 (GOWN DISPOSABLE) ×2 IMPLANT
GOWN STRL REUS W/TWL LRG LVL3 (GOWN DISPOSABLE) ×2
GOWN STRL REUS W/TWL XL LVL3 (GOWN DISPOSABLE) ×4 IMPLANT
LOOP VESSEL MAXI BLUE (MISCELLANEOUS) ×4 IMPLANT
NDL SAFETY ECLIPSE 18X1.5 (NEEDLE) ×2 IMPLANT
NEEDLE HYPO 18GX1.5 SHARP (NEEDLE) ×2
NEEDLE PRECISIONGLIDE 27X1.5 (NEEDLE) ×4 IMPLANT
NS IRRIG 1000ML POUR BTL (IV SOLUTION) ×4 IMPLANT
PACK BASIN DAY SURGERY FS (CUSTOM PROCEDURE TRAY) ×4 IMPLANT
PAD CAST 3X4 CTTN HI CHSV (CAST SUPPLIES) ×2 IMPLANT
PADDING CAST ABS 4INX4YD NS (CAST SUPPLIES)
PADDING CAST ABS COTTON 4X4 ST (CAST SUPPLIES) IMPLANT
PADDING CAST COTTON 3X4 STRL (CAST SUPPLIES) ×2
SLEEVE SCD COMPRESS KNEE MED (MISCELLANEOUS) ×4 IMPLANT
SLING ARM FOAM STRAP XLG (SOFTGOODS) ×4 IMPLANT
SPLINT PLASTER CAST XFAST 3X15 (CAST SUPPLIES) ×20 IMPLANT
SPLINT PLASTER XTRA FASTSET 3X (CAST SUPPLIES) ×20
STOCKINETTE 4X48 STRL (DRAPES) ×4 IMPLANT
SUT ETHILON 4 0 PS 2 18 (SUTURE) ×8 IMPLANT
SUT ETHILON 5 0 PS 2 18 (SUTURE) IMPLANT
SUT SILK 4 0 PS 2 (SUTURE) ×4 IMPLANT
SYR BULB 3OZ (MISCELLANEOUS) ×4 IMPLANT
SYR CONTROL 10ML LL (SYRINGE) ×4 IMPLANT
TOWEL GREEN STERILE FF (TOWEL DISPOSABLE) ×4 IMPLANT
UNDERPAD 30X30 (UNDERPADS AND DIAPERS) IMPLANT

## 2018-05-01 NOTE — Anesthesia Preprocedure Evaluation (Addendum)
Anesthesia Evaluation  Patient identified by MRN, date of birth, ID band Patient awake    Reviewed: Allergy & Precautions, NPO status , Patient's Chart, lab work & pertinent test results, reviewed documented beta blocker date and time   Airway Mallampati: II  TM Distance: >3 FB Neck ROM: Full    Dental  (+) Teeth Intact, Dental Advisory Given   Pulmonary former smoker,    Pulmonary exam normal breath sounds clear to auscultation       Cardiovascular hypertension, Pt. on home beta blockers and Pt. on medications + CAD, + Past MI and + Cardiac Stents  Normal cardiovascular exam+ dysrhythmias (RBBB)  Rhythm:Regular Rate:Normal  Echo 12/16/14: Study Conclusions  - Left ventricle: The cavity size was normal. Wall thickness was increased in a pattern of mild LVH. Systolic function was normal. The estimated ejection fraction was in the range of 50% to 55%. Wall motion was normal; there were no regional wall motion abnormalities. Features are consistent with a pseudonormal left ventricular filling pattern, with concomitant abnormal relaxation and increased filling pressure (grade 2 diastolic dysfunction). - Right ventricle: The cavity size was mildly dilated.   Neuro/Psych PSYCHIATRIC DISORDERS Anxiety CVA    GI/Hepatic negative GI ROS, Neg liver ROS,   Endo/Other  diabetes, Type 2, Insulin DependentMorbid obesity  Renal/GU Renal InsufficiencyRenal disease     Musculoskeletal depuytren's right ring finger, trigger right ring finger   Abdominal   Peds  Hematology  (+) Blood dyscrasia (Xarelto), anemia ,   Anesthesia Other Findings Day of surgery medications reviewed with the patient.  Reproductive/Obstetrics                            Anesthesia Physical Anesthesia Plan  ASA: III  Anesthesia Plan: Regional and MAC   Post-op Pain Management:    Induction: Intravenous  PONV Risk Score and Plan: 1  and Propofol infusion and Treatment may vary due to age or medical condition  Airway Management Planned: Nasal Cannula and Natural Airway  Additional Equipment:   Intra-op Plan:   Post-operative Plan:   Informed Consent: I have reviewed the patients History and Physical, chart, labs and discussed the procedure including the risks, benefits and alternatives for the proposed anesthesia with the patient or authorized representative who has indicated his/her understanding and acceptance.   Dental advisory given  Plan Discussed with: CRNA and Anesthesiologist  Anesthesia Plan Comments:        Anesthesia Quick Evaluation

## 2018-05-01 NOTE — H&P (Signed)
Jesse Wheeler is an 77 y.o. male.   Chief Complaint: contracture and catching right ring finger HPI: Jesse Wheeler is a 77 year old left-hand-dominant former patient who has not been seen in 20 years. He comes in at the request of the [place] for consultation regarding catching of his right ring finger and Dupuytren's cord. He done a Dupuytren's excision fasciectomy on his left side approximately 20 years ago. Because no history of injury. He does not know his ancestry. He is not complaining of any pain. He does complain of catching of the finger. He has a history of diabetes arthritis no history of thyroid problems or gout. Family history is negative for each of these. He complains of minimal pain discomfort. He states that the ends to inability to extend his fingers been occurring for years. He is of distinct difficulty hearing.   Past Medical History:  Diagnosis Date  . Actinic keratoses 08/09/2013   Overview:  IMPRESSION: Liquid nitrogen x2   . 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 Anemia   . Bowen's disease   . Coronary artery disease   . Diabetes mellitus    type II  . Dupuytren's contracture of left hand   . History of colonic polyps 04/14/2014  . Hypercholesterolemia   . Hyperlipidemia   . Hypertension    LCX  . Obesity   . Onychomycosis   . ONYCHOMYCOSIS 09/07/2006   Qualifier: Diagnosis of  By: Cletus Gash MD, [place]    . Panic disorder   . Peyronie's disease   . Prostatitis, chronic   . Right bundle branch block   . S/P coronary angioplasty   . Stroke ([place])   . Tinnitus     Past Surgical History:  Procedure Laterality Date  . CATARACT EXTRACTION W/ INTRAOCULAR LENS  IMPLANT, BILATERAL    . CHOLECYSTECTOMY    . COLONOSCOPY  03/09/2004   Normal  . CORONARY ANGIOPLASTY  01/02/01   Acute inferior MI -- Stent placement in the proximal RCA (three separate stents totalling approximately 55 mm of stent with residual thrombus in the posterior  descending artery) --  Relatively mild inferior basilar hypokinesia --Severe stenosis in a secondary marginal vessel of the left circumflex -- Minimal coronary atherosclerosis otherwise of the coronary system   . Nuclear stress test  Oct 2011    Dense inferior infarct; mild ischemia with dilated LV. EF was 39%. Study was felt to be unchanged and he has been managed medically    Family History  Problem Relation Age of Onset  . Heart attack Father   . Heart disease Father   . Diabetes Sister   . Transient ischemic attack Sister   . Colon cancer Neg Hx    Social History:  reports that he quit smoking about 36 years ago. His smoking use included cigarettes. He has never used smokeless tobacco. He reports that he does not drink alcohol or use drugs.  Allergies: No Known Allergies  No medications prior to admission.    No results found for this or any previous visit (from the past 48 hour(s)).  No results found.   Pertinent items are noted in HPI.  Height 5' 9.5" (1.765 m), weight 130.2 kg.  General appearance: alert, cooperative and appears stated age Head: Normocephalic, without obvious abnormality Neck: no JVD Resp: clear to auscultation bilaterally Cardio: regularly irregular rhythm GI: soft, non-tender; bowel sounds normal; no masses,  no organomegaly Extremities: contracture and catching right ring  finger Pulses: 2+ and symmetric Skin: Skin color, texture, turgor normal. No rashes or lesions Neurologic: Grossly normal Incision/Wound: na  Assessment/Plan Assessment:   Contracture of palmar fascia   Trigger ring finger of right hand    Plan: We have discussed the etiology with him and his wife who is seen with regarding the joints contracture trigger finger we have discussed various treatment alternatives with him. He in my opinion is not a candidate for Xiaflex injection due to the triggering of the finger. We would recommend a fasciectomy with release of the A1 pulley.  Prepare postoperative course are discussed along with risks and complications. He is aware there is no guarantee to the surgery possibility of infection recurrence injury to arteries nerves tendons complete relief symptoms dystrophy. Left side done in the past. He is scheduled for excision fasciectomy Dupuytren's cord right ring finger with a release A1 pulley as an outpatient under regional anesthesia. Is on Xarelto will have this checked with his cardiologist.    Daryll Brod 05/01/2018, 5:27 AM

## 2018-05-01 NOTE — Op Note (Signed)
NAME: Jesse Wheeler RECORD NO: [id number] DATE OF BIRTH: 02-09-1941 FACILITY[facility] LOCATION: [facility] PHYSICIAN: Wynonia Sours, MD   OPERATIVE REPORT   DATE OF PROCEDURE: 05/01/18    PREOPERATIVE DIAGNOSIS:   Triggering right ring finger Dupuytren's contracture right ring   POSTOPERATIVE DIAGNOSIS:   Same   PROCEDURE:   Fasciectomy right ring finger palmar fascia with release A1 pulley right ring finger   SURGEON: Daryll Brod, M.D.   ASSISTANT: none   ANESTHESIA:  Regional with sedation and Local   INTRAVENOUS FLUIDS:  Per anesthesia flow sheet.   ESTIMATED BLOOD LOSS:  Minimal.   COMPLICATIONS:  None.   SPECIMENS:   Palmar fascia   TOURNIQUET TIME:    Total Tourniquet Time Documented: Upper Arm (Right) - 63 minutes Total: Upper Arm (Right) - 63 minutes    DISPOSITION:  Stable to PACU.   INDICATIONS: Patient is a 77 year old male with a history of Dupuytren's contracture he comes in complaining of triggering of his right ring finger.  He has a significant deformity to his finger with contracture of the metacarpal phalangeal joint PIP joint right ring finger he has elected undergo release of the A1 pulley along with fasciectomy of the right ring finger.  Pre-peri-and postoperative course been discussed along with risks and complications.  He is aware that there is no guarantee to the surgery the possibility of infection recurrence injury to arteries nerves tendons complete relief symptoms dystrophy recurrence of the contracture loss of finger.  In the preoperative area the patient is seen the extremity marked by both patient and surgeon antibiotic given  OPERATIVE COURSE: Patient is brought to the operating room after a supraclavicular block was carried out without difficulty preoperative area.  He was prepped using ChloraPrep in the supine position with the right arm free.  A three-minute dry time was allowed and a timeout taken to confirm patient  procedure.  The limb was exsanguinated with an Esmarch bandage turn placed on the upper arm was inflated to 250 mmHg.  A volar Bruner incision was made carried down through subcutaneous tissue.  The palmar fascia was identified proximally this was released including the middle ring and small fingers.  The palmar fascia which was involved was immediately apparent and traced distally to identifying neurovascular bundles deeply and protecting these throughout the procedure the dissection was carried distally there was significant scarring and nation of a cord about the A2 pulley with significant compression of the A1 pulley.  The pulley was released on its radial aspect a partial synovectomy performed proximally.  The fascia was then dissected distally including lateral digital sheet on the radial side natatory ligament involvement was also released protecting the neurovascular bundle over the entire course out to the middle phalanx.  This allowed complete extension of the MP PIP joint.  With passive flexion no further triggering was noted.  Neurovascular bundles were intact radially and ulnarly.  The specimen was sent to pathology.  These were converted to wise.  The wound was copiously irrigated with saline.  Thrombin was then instilled a doubled over vessel loop drain was placed in the incision was closed with interrupted 4 nylon sutures.  A sterile compressive dressing was applied the tourniquet deflated all fingers immediately pink dorsal splint was applied patient tolerated the procedure well was taken to the recovery room for observation in satisfactory condition.  He will be discharged home to return Jesse Wheeler agrees were in 1 week on Norco.  Daryll Brod, MD Electronically signed, 05/01/18

## 2018-05-01 NOTE — Progress Notes (Signed)
Assisted Dr. Gifford Shave with right, ultrasound guided, axillary block. Side rails up, monitors on throughout procedure. See vital signs in flow sheet. Tolerated Procedure well.

## 2018-05-01 NOTE — Brief Op Note (Signed)
05/01/2018  9:56 AM  PATIENT:  Jesse Wheeler  77 y.o. male  PRE-OPERATIVE DIAGNOSIS:  depuytren's right ring finger, trigger right ring finger  POST-OPERATIVE DIAGNOSIS:  depuytren's right ring finger, trigger right ring finger  PROCEDURE:  Procedure(s): FASCIECTOMY RIGHT RING FINGER (Right) RELEASE TRIGGER FINGER RIGHT RING FINGER (Right)  SURGEON:  Surgeon(s) and Role:    Daryll Brod, MD - Primary  PHYSICIAN ASSISTANT:   ASSISTANTS: none   ANESTHESIA:   local, regional and IV sedation  EBL:  85ml   BLOOD ADMINISTERED:none  DRAINS: Penrose drain in the Hand   LOCAL MEDICATIONS USED:  BUPIVICAINE   SPECIMEN:  Excision  DISPOSITION OF SPECIMEN:  PATHOLOGY  COUNTS:  YES  TOURNIQUET:   Total Tourniquet Time Documented: Upper Arm (Right) - 63 minutes Total: Upper Arm (Right) - 63 minutes   DICTATION: .Viviann Spare Dictation  PLAN OF CARE: Discharge to home after PACU  PATIENT DISPOSITION:  PACU - hemodynamically stable.

## 2018-05-01 NOTE — Anesthesia Postprocedure Evaluation (Signed)
Anesthesia Post Note  Patient: Jesse Wheeler  Procedure(s) Performed: FASCIECTOMY RIGHT RING FINGER (Right Hand) RELEASE TRIGGER FINGER RIGHT RING FINGER (Right Hand)     Patient location during evaluation: PACU Anesthesia Type: Regional Level of consciousness: awake and alert Pain management: pain level controlled Vital Signs Assessment: post-procedure vital signs reviewed and stable Respiratory status: spontaneous breathing Cardiovascular status: stable Anesthetic complications: no    Last Vitals:  Vitals:   05/01/18 1030 05/01/18 1115  BP: 134/72 131/72  Pulse: 64 63  Resp: 20 18  Temp:  37 C  SpO2: 95% 95%    Last Pain:  Vitals:   05/01/18 1115  TempSrc:   PainSc: 0-No pain                 Nolon Nations

## 2018-05-01 NOTE — Anesthesia Procedure Notes (Signed)
Anesthesia Regional Block: Axillary brachial plexus block   Pre-Anesthetic Checklist: ,, timeout performed, Correct Patient, Correct Site, Correct Laterality, Correct Procedure, Correct Position, site marked, Risks and benefits discussed,  Surgical consent,  Pre-op evaluation,  At surgeon's request and post-op pain management  Laterality: Right and Upper  Prep: chloraprep       Needles:  Injection technique: Single-shot  Needle Type: Echogenic Stimulator Needle     Needle Length: 9cm  Needle Gauge: 21     Additional Needles:   Procedures:,,,, ultrasound used (permanent image in chart),,,,  Narrative:  Start time: 05/01/2018 7:40 AM End time: 05/01/2018 7:50 AM Injection made incrementally with aspirations every 5 mL.  Performed by: Personally  Anesthesiologist: Lyn Hollingshead, MD  Additional Notes: No pain on injection. No increased resistance to injection. Injection made in 5cc increments.  Good needle visualization.  Patient tolerated procedure well.

## 2018-05-01 NOTE — Transfer of Care (Signed)
Immediate Anesthesia Transfer of Care Note  Patient: Jesse Wheeler  Procedure(s) Performed: FASCIECTOMY RIGHT RING FINGER (Right Hand) RELEASE TRIGGER FINGER RIGHT RING FINGER (Right Hand)  Patient Location: PACU  Anesthesia Type:MAC and Regional  Level of Consciousness: awake, alert  and oriented  Airway & Oxygen Therapy: Patient Spontanous Breathing and Patient connected to face mask oxygen  Post-op Assessment: Report given to RN and Post -op Vital signs reviewed and stable  Post vital signs: Reviewed and stable  Last Vitals:  Vitals Value Taken Time  BP 131/69 05/01/2018 10:00 AM  Temp    Pulse 71 05/01/2018 10:02 AM  Resp 19 05/01/2018 10:02 AM  SpO2 95 % 05/01/2018 10:02 AM  Vitals shown include unvalidated device data.  Last Pain:  Vitals:   05/01/18 0743  TempSrc:   PainSc: 0-No pain         Complications: No apparent anesthesia complications

## 2018-05-01 NOTE — Discharge Instructions (Addendum)
[facility] Instructions Hand Surgery  Wound Care: Keep your hand elevated above the level of your heart.  Do not allow it to dangle by your side.  Keep the dressing dry and do not remove it unless your doctor advises you to do so.  He will usually change it at the time of your post-op visit.  Moving your fingers is advised to stimulate circulation but will depend on the site of your surgery.  If you have a splint applied, your doctor will advise you regarding movement.  Activity: Do not drive or operate machinery today.  Rest today and then you may return to your normal activity and work as indicated by your physician.  Diet:  Drink liquids today or eat a light diet.  You may resume a regular diet tomorrow.    General expectations: Pain for two to three days. Fingers may become slightly swollen.  Call your doctor if any of the following occur: Severe pain not relieved by pain medication. Elevated temperature. Dressing soaked with blood. Inability to move fingers. White or bluish color to fingers.      Post Anesthesia Home Care Instructions  Activity: Get plenty of rest for the remainder of the day. A responsible individual must stay with you for 24 hours following the procedure.  For the next 24 hours, DO NOT: -Drive a car -Paediatric nurse -Drink alcoholic beverages -Take any medication unless instructed by your physician -Make any legal decisions or sign important papers.  Meals: Start with liquid foods such as gelatin or soup. Progress to regular foods as tolerated. Avoid greasy, spicy, heavy foods. If nausea and/or vomiting occur, drink only clear liquids until the nausea and/or vomiting subsides. Call your physician if vomiting continues.  Special Instructions/Symptoms: Your throat may feel dry or sore from the anesthesia or the breathing tube placed in your throat during surgery. If this causes discomfort, gargle with warm salt water. The discomfort should disappear  within 24 hours.  If you had a scopolamine patch placed behind your ear for the management of post- operative nausea and/or vomiting:  1. The medication in the patch is effective for 72 hours, after which it should be removed.  Wrap patch in a tissue and discard in the trash. Wash hands thoroughly with soap and water. 2. You may remove the patch earlier than 72 hours if you experience unpleasant side effects which may include dry mouth, dizziness or visual disturbances. 3. Avoid touching the patch. Wash your hands with soap and water after contact with the patch.    Post Anesthesia Home Care Instructions  Activity: Get plenty of rest for the remainder of the day. A responsible individual must stay with you for 24 hours following the procedure.  For the next 24 hours, DO NOT: -Drive a car -Paediatric nurse -Drink alcoholic beverages -Take any medication unless instructed by your physician -Make any legal decisions or sign important papers.  Meals: Start with liquid foods such as gelatin or soup. Progress to regular foods as tolerated. Avoid greasy, spicy, heavy foods. If nausea and/or vomiting occur, drink only clear liquids until the nausea and/or vomiting subsides. Call your physician if vomiting continues.  Special Instructions/Symptoms: Your throat may feel dry or sore from the anesthesia or the breathing tube placed in your throat during surgery. If this causes discomfort, gargle with warm salt water. The discomfort should disappear within 24 hours.  If you had a scopolamine patch placed behind your ear for the management of post- operative nausea  and/or vomiting:  1. The medication in the patch is effective for 72 hours, after which it should be removed.  Wrap patch in a tissue and discard in the trash. Wash hands thoroughly with soap and water. 2. You may remove the patch earlier than 72 hours if you experience unpleasant side effects which may include dry mouth, dizziness or  visual disturbances. 3. Avoid touching the patch. Wash your hands with soap and water after contact with the patch.    Post Anesthesia Home Care Instructions  Activity: Get plenty of rest for the remainder of the day. A responsible individual must stay with you for 24 hours following the procedure.  For the next 24 hours, DO NOT: -Drive a car -Paediatric nurse -Drink alcoholic beverages -Take any medication unless instructed by your physician -Make any legal decisions or sign important papers.  Meals: Start with liquid foods such as gelatin or soup. Progress to regular foods as tolerated. Avoid greasy, spicy, heavy foods. If nausea and/or vomiting occur, drink only clear liquids until the nausea and/or vomiting subsides. Call your physician if vomiting continues.  Special Instructions/Symptoms: Your throat may feel dry or sore from the anesthesia or the breathing tube placed in your throat during surgery. If this causes discomfort, gargle with warm salt water. The discomfort should disappear within 24 hours.  If you had a scopolamine patch placed behind your ear for the management of post- operative nausea and/or vomiting:  1. The medication in the patch is effective for 72 hours, after which it should be removed.  Wrap patch in a tissue and discard in the trash. Wash hands thoroughly with soap and water. 2. You may remove the patch earlier than 72 hours if you experience unpleasant side effects which may include dry mouth, dizziness or visual disturbances. 3. Avoid touching the patch. Wash your hands with soap and water after contact with the patch.    Regional Anesthesia Blocks  1. Numbness or the inability to move the "blocked" extremity may last from 3-48 hours after placement. The length of time depends on the medication injected and your individual response to the medication. If the numbness is not going away after 48 hours, call your surgeon.  2. The extremity that is blocked  will need to be protected until the numbness is gone and the  Strength has returned. Because you cannot feel it, you will need to take extra care to avoid injury. Because it may be weak, you may have difficulty moving it or using it. You may not know what position it is in without looking at it while the block is in effect.  3. For blocks in the legs and feet, returning to weight bearing and walking needs to be done carefully. You will need to wait until the numbness is entirely gone and the strength has returned. You should be able to move your leg and foot normally before you try and bear weight or walk. You will need someone to be with you when you first try to ensure you do not fall and possibly risk injury.  4. Bruising and tenderness at the needle site are common side effects and will resolve in a few days.  5. Persistent numbness or new problems with movement should be communicated to the surgeon or the [facility] [phone] Nellie [phone]).

## 2018-05-02 ENCOUNTER — Encounter (HOSPITAL_BASED_OUTPATIENT_CLINIC_OR_DEPARTMENT_OTHER): Payer: Self-pay | Admitting: Orthopedic Surgery

## 2018-05-14 ENCOUNTER — Ambulatory Visit: Payer: Medicare Other | Admitting: Internal Medicine

## 2018-05-14 DIAGNOSIS — D500 Iron deficiency anemia secondary to blood loss (chronic): Secondary | ICD-10-CM

## 2018-05-14 DIAGNOSIS — D509 Iron deficiency anemia, unspecified: Secondary | ICD-10-CM

## 2018-05-14 NOTE — Patient Instructions (Signed)

## 2018-05-14 NOTE — Progress Notes (Signed)

## 2018-05-14 NOTE — Progress Notes (Signed)
=  Patient arrived for capsule endoscopy; =Patient tolerated procedure without complications; =Patient verbalized understanding of written and verbal instructions;  =CAPSULE= [id number] =EXPIRES=08-05-2019

## 2018-05-30 ENCOUNTER — Telehealth: Payer: Self-pay

## 2018-05-30 DIAGNOSIS — D500 Iron deficiency anemia secondary to blood loss (chronic): Secondary | ICD-10-CM

## 2018-05-30 NOTE — Telephone Encounter (Signed)
-----   Message from Gatha Mayer, MD sent at 05/29/2018  5:51 PM EST ----- Regarding: failed capsule (again) f/u needed Please explain that the capsule di not leave the stomach while it had power  I need to see him in the office  He also needs CBC and ferritin testing anytime now due to iron def anemia

## 2018-05-30 NOTE — Telephone Encounter (Signed)
Patient's wife notified Follow up scheduled for 07/05/18 11:00 She will bring him for labs at their convenience

## 2018-06-11 ENCOUNTER — Telehealth: Payer: Self-pay | Admitting: Internal Medicine

## 2018-06-11 NOTE — Telephone Encounter (Signed)
Pt called and advised that Dr. Carlean Purl wants for him to get a CT scan, but PT would like to get it [facility] with the [facility]. [facility] Carle[facility] Purl fax # : [phone] Ms. Shirley.

## 2018-06-11 NOTE — Telephone Encounter (Signed)
Patient was to have records sent from the [place] of a CT scan he had in 2018.  According to the [place], they told him he did not have a CT at the [place].  He is now asking that we order a CT.  I explained to the patient that can't order a CT without an order and Dr. Carlean Purl has not ordered this at this time.  He is reminded to come for his labs that we requested and the office visit for 07/05/18.  At that time Dr. Carlean Purl can discuss if he still wishes to have CT.

## 2018-07-03 ENCOUNTER — Other Ambulatory Visit (INDEPENDENT_AMBULATORY_CARE_PROVIDER_SITE_OTHER): Payer: Medicare Other

## 2018-07-03 DIAGNOSIS — D500 Iron deficiency anemia secondary to blood loss (chronic): Secondary | ICD-10-CM

## 2018-07-03 LAB — FERRITIN: FERRITIN: 40.8 ng/mL (ref 22.0–322.0)

## 2018-07-03 LAB — CBC
HCT: 43.1 % (ref 39.0–52.0)
Hemoglobin: 14.3 g/dL (ref 13.0–17.0)
MCHC: 33.1 g/dL (ref 30.0–36.0)
MCV: 94.8 fl (ref 78.0–100.0)
Platelets: 137 10*3/uL — ABNORMAL LOW (ref 150.0–400.0)
RBC: 4.55 Mil/uL (ref 4.22–5.81)
RDW: 14.5 % (ref 11.5–15.5)
WBC: 5.7 10*3/uL (ref 4.0–10.5)

## 2018-07-05 ENCOUNTER — Ambulatory Visit: Payer: Medicare Other | Admitting: Internal Medicine

## 2018-07-05 ENCOUNTER — Encounter: Payer: Self-pay | Admitting: Internal Medicine

## 2018-07-05 ENCOUNTER — Telehealth: Payer: Self-pay

## 2018-07-05 VITALS — BP 100/60 | HR 92 | Ht 68.5 in | Wt 290.5 lb

## 2018-07-05 DIAGNOSIS — D500 Iron deficiency anemia secondary to blood loss (chronic): Secondary | ICD-10-CM

## 2018-07-05 DIAGNOSIS — Z7901 Long term (current) use of anticoagulants: Secondary | ICD-10-CM | POA: Diagnosis not present

## 2018-07-05 NOTE — Progress Notes (Signed)
Jesse Wheeler 78 y.o. [birth year] [id number]  Assessment & Plan:   Encounter Diagnoses  Name Primary?  . Iron deficiency anemia due to chronic blood loss Yes  . Long term current use of anticoagulant    I have assumed he was anemic from chronic blood loss.  I am reassured that things are stable at this point and I agree that trying to pursue a capsule endoscopy again is probably not worth it.  Given that the capsule sat in his stomach for a while, and he has had a history of intestinal metaplasia there we have decided to pursue an EGD.  Plan to hold his Xarelto for 1 to 2 days before, I have reviewed the increased risk of stroke off of that though rare its real, and we will try to clarify that with his cardiologist at the [facility].   Subjective:   Chief Complaint: Follow-up of iron deficiency anemia  HPI Jesse Wheeler is here with his wife today, I had seen him over the years, and recently for iron deficiency anemia thought due to chronic blood loss perhaps.  He has had a screening colonoscopy that was positive for a 2 mm rectal adenoma in 2015, and EGD showed some erosive gastritis and small ulcers changes in the antrum in 2018.  He also had a small hyperplastic polyp, removed from the cardia.  I had recommended that he do a capsule endoscopy in late 2019, the first capsule had technical difficulties and the second 1 sat in his stomach before it passed and we did not get small bowel information.  He is not willing to repeat one.  He has had Feraheme infusions in the past.  He did not seem to hold his iron levels despite that which is why I elected to pursue a capsule endoscopy.  He has some history of lung nodules followed by CT scans at the [facility] in the [place].  He takes Xarelto chronically which I think is due to A. fib there is a history of a stroke at some point.  Recent bronchitis or URI with bronchospasm on prednisone for 6-day treatment course right now, he is on day 2 and  improved. CBC with platelets 137 otherwise normal hemoglobin and a ferritin of 2 days ago.  Similar numbers to 8 months ago. No Known Allergies Current Meds  Medication Sig  . aspirin EC 81 MG tablet Take 81 mg by mouth.  . Blood Glucose Monitoring Suppl (FIFTY50 GLUCOSE METER 2.0) W/DEVICE KIT by Does not apply route.  . Cyanocobalamin (VITAMIN B-12 PO) Take 1 tablet by mouth daily.    Marland Kitchen doxazosin (CARDURA) 8 MG tablet Take 8 mg by mouth daily.   . ergocalciferol (VITAMIN D2) 50000 units capsule Take 50,000 Units by mouth once a week.  . ezetimibe (ZETIA) 10 MG tablet Take 10 mg by mouth daily.   . furosemide (LASIX) 20 MG tablet Take 20 mg by mouth daily.   Marland Kitchen gabapentin (NEURONTIN) 300 MG capsule Take 600 mg by mouth 3 (three) times daily.   Marland Kitchen HYDROcodone-acetaminophen (NORCO) 5-325 MG tablet Take 1 tablet by mouth every 6 (six) hours as needed.  . insulin aspart (NOVOLOG) 100 UNIT/ML injection Inject 6 Units into the skin 3 (three) times daily with meals.  . insulin glargine (LANTUS) 100 UNIT/ML injection Inject 50 Units into the skin 2 (two) times daily.   . isosorbide mononitrate (IMDUR) 30 MG 24 hr tablet TAKE 2 TABLETS PO DAILY  . lisinopril (PRINIVIL,ZESTRIL)  20 MG tablet Take 1 tablet (20 mg total) by mouth daily.  . metoprolol succinate (TOPROL-XL) 25 MG 24 hr tablet Take 12.5 mg by mouth daily.  . nitroGLYCERIN (NITROSTAT) 0.4 MG SL tablet Place 1 tablet (0.4 mg total) under the tongue every 5 (five) minutes as needed.  Glory Rosebush VERIO test strip   . Rivaroxaban (XARELTO) 15 MG TABS tablet Take 1 tablet by mouth once daily with a meal  . Semaglutide (OZEMPIC) 1 MG/DOSE SOPN Inject 1 mg into the skin once a week. [place]  . senna-docusate (SENOKOT-S) 8.6-50 MG tablet Take 1 tablet by mouth as needed for mild constipation.  . simvastatin (ZOCOR) 40 MG tablet Take 40 mg by mouth daily.  . temazepam (RESTORIL) 15 MG capsule Take 15 mg by mouth at bedtime as needed for sleep.   Past  Medical History:  Diagnosis Date  . Actinic keratoses 08/09/2013   Overview:  IMPRESSION: Liquid nitrogen x2   . 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 Anemia   . Bowen's disease   . Coronary artery disease   . Diabetes mellitus    type II  . Dupuytren's contracture of left hand   . History of colonic polyps 04/14/2014  . Hypercholesterolemia   . Hyperlipidemia   . Hypertension    LCX  . Obesity   . Onychomycosis   . ONYCHOMYCOSIS 09/07/2006   Qualifier: Diagnosis of  By: Cletus Gash MD, [place]    . Panic disorder   . Peyronie's disease   . Prostatitis, chronic   . Right bundle branch block   . S/P coronary angioplasty   . Stroke [facility])    takes xarelto  . Tinnitus    Past Surgical History:  Procedure Laterality Date  . CATARACT EXTRACTION W/ INTRAOCULAR LENS  IMPLANT, BILATERAL    . CHOLECYSTECTOMY    . COLONOSCOPY  03/09/2004   Normal  . CORONARY ANGIOPLASTY  01/02/01   Acute inferior MI -- Stent placement in the proximal RCA (three separate stents totalling approximately 55 mm of stent with residual thrombus in the posterior descending artery) --  Relatively mild inferior basilar hypokinesia --Severe stenosis in a secondary marginal vessel of the left circumflex -- Minimal coronary atherosclerosis otherwise of the coronary system   . FASCIECTOMY Right 05/01/2018   Procedure: FASCIECTOMY RIGHT RING FINGER;  Surgeon: Daryll Brod, MD;  Location: [place];  Service: Orthopedics;  Laterality: Right;  . Nuclear stress test  Oct 2011    Dense inferior infarct; mild ischemia with dilated LV. EF was 39%. Study was felt to be unchanged and he has been managed medically  . TRIGGER FINGER RELEASE Right 05/01/2018   Procedure: RELEASE TRIGGER FINGER RIGHT RING FINGER;  Surgeon: Daryll Brod, MD;  Location: [place];  Service: Orthopedics;  Laterality: Right;   Social History   Social History Narrative   He is a  retired Firefighter, he also served as a Psychiatrist.   Daily caffeine drinks    He is married and he has 1 son   family history includes Diabetes in his sister; Heart attack in his father; Heart disease in his father; Transient ischemic attack in his sister.   Review of Systems See HPI  Objective:   Physical Exam BP 100/60 (BP Location: Left Arm, Patient Position: Sitting, Cuff Size: Normal)   Pulse 92   Ht 5' 8.5" (1.74  m)   Wt 290 lb 8 oz (131.8 kg)   BMI 43.53 kg/m  NAD, obese Lungs rhonchi but good air mvt abd obese Cor NL

## 2018-07-05 NOTE — Assessment & Plan Note (Signed)
EGD

## 2018-07-05 NOTE — Patient Instructions (Signed)
  You have been scheduled for an endoscopy. Please follow written instructions given to you at your visit today. If you use inhalers (even only as needed), please bring them with you on the day of your procedure.   You will be contaced by our office prior to your procedure for directions on holding your Xarelto.  If you do not hear from our office 1 week prior to your scheduled procedure, please call [phone] to discuss.   I appreciate the opportunity to care for you. Silvano Rusk, MD, [facility]

## 2018-07-05 NOTE — Telephone Encounter (Signed)
I called and spoke with the cardiology department at the [facility] in [place] and confirmed there fax #. Att: Dr. Carney Bern.

## 2018-07-05 NOTE — Telephone Encounter (Signed)
Pt called to confirm that the cardiologist is Dr. Raynelle Dick: phone # [phone] ext [phone]

## 2018-07-05 NOTE — Telephone Encounter (Signed)
[facility] Pre-operative Risk Assessment     Request for surgical clearance:     Endoscopy Procedure  What type of surgery is being performed?     EGD  When is this surgery scheduled?     08/09/2018  What type of clearance is required ?   Pharmacy  Are there any medications that need to be held prior to surgery and how long? Xarelto, 1-2 days  Practice name and name of physician performing surgery?      [facility]  What is your office phone and fax number?      Phone- [phone]  [phone]  Anesthesia type (None, local, MAC, general) ?       MAC    This anti-coag letter has been faxed to the [facility] in [place] at fax # [phone], phone # [phone]. No one is sure of her first name so we just addressed it to Dr Raynelle Dick.

## 2018-07-10 NOTE — Telephone Encounter (Signed)
We received a fax today with approval to hold [name] for 2 days per Dr Carney Bern at the [facility] in [place]. Mrs. Owens Shark informed and wrote it down. She said he can't hear so she will tell him. She verbalized understanding. Will send the fax to be scanned into epic.

## 2018-08-07 ENCOUNTER — Telehealth: Payer: Self-pay | Admitting: Internal Medicine

## 2018-08-07 NOTE — Telephone Encounter (Signed)
I called both numbers and left messages telling him to stay on Xarelto and to cancel the procedure for 3/26 (EGD)  We will need to reschedule this at later date

## 2018-08-09 ENCOUNTER — Encounter: Payer: Medicare Other | Admitting: Internal Medicine

## 2018-09-11 ENCOUNTER — Telehealth: Payer: Self-pay | Admitting: *Deleted

## 2018-09-11 NOTE — Telephone Encounter (Signed)
Rescheduled previously cancelled appointment due to Covid-19 with wife. Instructed to hold Xarelto for 2 days (pt takes in evening so he will not take dose for today or tomorrow) Instructed clear liquids only after midnight on 09/13/18 and nothing by mouth after 800 am. Wife reports understanding.

## 2018-09-12 ENCOUNTER — Telehealth: Payer: Self-pay | Admitting: *Deleted

## 2018-09-12 NOTE — Telephone Encounter (Signed)
Covid-19 travel screening questions  Have you traveled in the last 14 days? no If yes where?  Do you now or have you had a fever in the last 14 days? no  Do you have any respiratory symptoms of shortness of breath or cough now or in the last 14 days? no  Do you have any family members or close contacts with diagnosed or suspected Covid-19? No  Spoke with wife- aware that pt only will be coming into the building for procedure.  Encouraged to wear a mask when entering building

## 2018-09-13 ENCOUNTER — Encounter: Payer: Self-pay | Admitting: Internal Medicine

## 2018-09-13 ENCOUNTER — Ambulatory Visit (AMBULATORY_SURGERY_CENTER): Payer: Medicare Other | Admitting: Internal Medicine

## 2018-09-13 ENCOUNTER — Other Ambulatory Visit: Payer: Self-pay

## 2018-09-13 VITALS — BP 141/84 | HR 82 | Temp 98.3°F | Resp 18 | Ht 68.5 in | Wt 290.5 lb

## 2018-09-13 DIAGNOSIS — C163 Malignant neoplasm of pyloric antrum: Secondary | ICD-10-CM

## 2018-09-13 DIAGNOSIS — D500 Iron deficiency anemia secondary to blood loss (chronic): Secondary | ICD-10-CM

## 2018-09-13 DIAGNOSIS — C169 Malignant neoplasm of stomach, unspecified: Secondary | ICD-10-CM | POA: Diagnosis not present

## 2018-09-13 MED ORDER — OMEPRAZOLE 40 MG PO CPDR: 40.0000 mg | DELAYED_RELEASE_CAPSULE | Freq: Every day | ORAL | 3 refills | Status: AC

## 2018-09-13 MED ORDER — SODIUM CHLORIDE 0.9 % IV SOLN: 500.0000 mL | Freq: Once | INTRAVENOUS | Status: DC

## 2018-09-13 NOTE — Op Note (Signed)
[place] Patient Name: Jesse Wheeler Procedure Date: 09/13/2018 10:01 AM MRN: [id number] Endoscopist: Gatha Mayer , MD Age: 78 Referring MD:  Date of Birth: Dec 14, 1940 Gender: Male Account #: [id number] Procedure:                Upper GI endoscopy Indications:              Iron deficiency anemia secondary to chronic blood                            loss, Unexplained iron deficiency anemia Medicines:                Propofol per Anesthesia, Monitored Anesthesia Care Procedure:                Pre-Anesthesia Assessment:                           - Prior to the procedure, a History and Physical                            was performed, and patient medications and                            allergies were reviewed. The patient's tolerance of                            previous anesthesia was also reviewed. The risks                            and benefits of the procedure and the sedation                            options and risks were discussed with the patient.                            All questions were answered, and informed consent                            was obtained. Prior Anticoagulants: The patient                            last took Xarelto (rivaroxaban) 2 days prior to the                            procedure. ASA Grade Assessment: III - A patient                            with severe systemic disease. After reviewing the                            risks and benefits, the patient was deemed in                            satisfactory condition to undergo the procedure.  After obtaining informed consent, the endoscope was                            passed under direct vision. Throughout the                            procedure, the patient's blood pressure, pulse, and                            oxygen saturations were monitored continuously. The                            Model GIF-HQ190 [id number]) scope was introduced                through the mouth, and advanced to the second part                            of duodenum. The upper GI endoscopy was                            accomplished without difficulty. The patient                            tolerated the procedure well. Scope In: Scope Out: Findings:                 One non-bleeding cratered gastric ulcer with no                            stigmata of bleeding was found on the posterior                            wall of the gastric antrum. The lesion was 12 mm in                            largest dimension. Biopsies were taken with a cold                            forceps for histology. Verification of patient                            identification for the specimen was done. Estimated                            blood loss was minimal.                           The exam was otherwise without abnormality.                           The cardia and gastric fundus were normal on                            retroflexion. Complications:  No immediate complications. Estimated Blood Loss:     Estimated blood loss was minimal. Impression:               - Non-bleeding gastric ulcer with no stigmata of                            bleeding. Biopsied. Irregular mucosa surrounding.                            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 This is likely cause of                            anemia and iron deficiency                           - The examination was otherwise normal. Recommendation:           - Patient has a contact number available for                            emergencies. The signs and symptoms of potential                            delayed complications were discussed with the                            patient. Return to normal activities tomorrow.                            Written discharge instructions were provided to the                            patient.                            - Resume previous diet.                           - Continue present medications.                           - Resume Xarelto (rivaroxaban) at prior dose in 2                            days.                           - Await pathology results.                           - Use Prilosec (omeprazole) 40 mg PO daily                            indefinitely. Ofilia Neas  Carlean Purl, MD 09/13/2018 10:26:59 AM This report has been signed electronically.

## 2018-09-13 NOTE — Progress Notes (Signed)
Called to room to assist during endoscopic procedure.  Patient ID and intended procedure confirmed with present staff. Received instructions for my participation in the procedure from the performing physician.  

## 2018-09-13 NOTE — Patient Instructions (Addendum)
There is a bigger ulcer where the small superficial one was last time. I took biopsies again. You must be leaking blood (and iron) from this.  I have sent a prescription for omeprazole to the pharmacy. Start that today or tomorrow.   Restart Xarelto on Saturday 5/2  I will be in touch.  I appreciate the opportunity to care for you. Gatha Mayer, MD, FACG  YOU HAD AN ENDOSCOPIC PROCEDURE TODAY AT [facility]:   Refer to the procedure report that was given to you for any specific questions about what was found during the examination.  If the procedure report does not answer your questions, please call your gastroenterologist to clarify.  If you requested that your care partner not be given the details of your procedure findings, then the procedure report has been included in a sealed envelope for you to review at your convenience later.  YOU SHOULD EXPECT: Some feelings of bloating in the abdomen. Passage of more gas than usual.  Walking can help get rid of the air that was put into your GI tract during the procedure and reduce the bloating. If you had a lower endoscopy (such as a colonoscopy or flexible sigmoidoscopy) you may notice spotting of blood in your stool or on the toilet paper. If you underwent a bowel prep for your procedure, you may not have a normal bowel movement for a few days.  Please Note:  You might notice some irritation and congestion in your nose or some drainage.  This is from the oxygen used during your procedure.  There is no need for concern and it should clear up in a day or so.  SYMPTOMS TO REPORT IMMEDIATELY:   Following upper endoscopy (EGD)  Vomiting of blood or coffee ground material  New chest pain or pain under the shoulder blades  Painful or persistently difficult swallowing  New shortness of breath  Fever of 100F or higher  Black, tarry-looking stools  For urgent or emergent issues, a gastroenterologist can be reached at  any hour by calling [phone].   DIET:  We do recommend a small meal at first, but then you may proceed to your regular diet.  Drink plenty of fluids but you should avoid alcoholic beverages for 24 hours.  ACTIVITY:  You should plan to take it easy for the rest of today and you should NOT DRIVE or use heavy machinery until tomorrow (because of the sedation medicines used during the test).    FOLLOW UP: Our staff will call the number listed on your records the next business day following your procedure to check on you and address any questions or concerns that you may have regarding the information given to you following your procedure. If we do not reach you, we will leave a message.  However, if you are feeling well and you are not experiencing any problems, there is no need to return our call.  We will assume that you have returned to your regular daily activities without incident.  If any biopsies were taken you will be contacted by phone or by letter within the next 1-3 weeks.  Please call us at [phone] if you have not heard about the biopsies in 3 weeks.    SIGNATURES/CONFIDENTIALITY: You and/or your care partner have signed paperwork which will be entered into your electronic medical record.  These signatures attest to the fact that that the information above on your After Visit Summary has been  reviewed and is understood.  Full responsibility of the confidentiality of this discharge information lies with you and/or your care-partner. 

## 2018-09-13 NOTE — Progress Notes (Signed)
PT taken to PACU. Monitors in place. VSS. Report given to RN. 

## 2018-09-17 ENCOUNTER — Telehealth: Payer: Self-pay | Admitting: *Deleted

## 2018-09-17 ENCOUNTER — Telehealth: Payer: Self-pay | Admitting: Internal Medicine

## 2018-09-17 ENCOUNTER — Telehealth: Payer: Self-pay

## 2018-09-17 DIAGNOSIS — C169 Malignant neoplasm of stomach, unspecified: Secondary | ICD-10-CM

## 2018-09-17 NOTE — Telephone Encounter (Signed)
No answer. No identifier. Message left to call if questions or concerns and we would make an attempt to call a little later in the day. 

## 2018-09-17 NOTE — Telephone Encounter (Signed)
No answer or voicemail pickup on 2nd follow up call.

## 2018-09-17 NOTE — Telephone Encounter (Signed)
Received a call from Dr. 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 Comp Met (CMET)    Standing Status:   Future    Standing Expiration Date:   10/18/2018  . CBC    Standing Status:   Future    Standing Expiration Date:   10/18/2018  . CEA    Standing Status:   Future    Standing Expiration Date:   10/18/2018    Once I see these will decide on staging tests - may not be able to have IV contrast as has CKD

## 2018-09-18 ENCOUNTER — Other Ambulatory Visit: Payer: Self-pay

## 2018-09-18 ENCOUNTER — Other Ambulatory Visit (INDEPENDENT_AMBULATORY_CARE_PROVIDER_SITE_OTHER): Payer: Medicare Other

## 2018-09-18 DIAGNOSIS — C169 Malignant neoplasm of stomach, unspecified: Secondary | ICD-10-CM | POA: Diagnosis not present

## 2018-09-18 LAB — COMPREHENSIVE METABOLIC PANEL
ALT: 36 U/L (ref 0–53)
AST: 45 U/L — ABNORMAL HIGH (ref 0–37)
Albumin: 4 g/dL (ref 3.5–5.2)
Alkaline Phosphatase: 50 U/L (ref 39–117)
BUN: 22 mg/dL (ref 6–23)
CO2: 22 mEq/L (ref 19–32)
Calcium: 8.9 mg/dL (ref 8.4–10.5)
Chloride: 103 mEq/L (ref 96–112)
Creatinine, Ser: 1.96 mg/dL — ABNORMAL HIGH (ref 0.40–1.50)
GFR: 33.35 mL/min — ABNORMAL LOW (ref >60.00)
Glucose, Bld: 138 mg/dL — ABNORMAL HIGH (ref 70–99)
Potassium: 4.5 mEq/L (ref 3.5–5.1)
Sodium: 137 mEq/L (ref 135–145)
Total Bilirubin: 0.6 mg/dL (ref 0.2–1.2)
Total Protein: 6.6 g/dL (ref 6.0–8.3)

## 2018-09-18 LAB — CBC
HCT: 43.3 % (ref 39.0–52.0)
Hemoglobin: 14.3 g/dL (ref 13.0–17.0)
MCHC: 33.1 g/dL (ref 30.0–36.0)
MCV: 93.6 fl (ref 78.0–100.0)
Platelets: 152 10*3/uL (ref 150.0–400.0)
RBC: 4.63 Mil/uL (ref 4.22–5.81)
RDW: 15.1 % (ref 11.5–15.5)
WBC: 8 10*3/uL (ref 4.0–10.5)

## 2018-09-18 NOTE — Telephone Encounter (Signed)
Patient called to inquire when he should have labs done again? Call back to [phone].

## 2018-09-18 NOTE — Progress Notes (Signed)
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 Burr Medico or Benay Spice for gastric cancer - new dx

## 2018-09-19 ENCOUNTER — Telehealth: Payer: Self-pay | Admitting: Nurse Practitioner

## 2018-09-19 LAB — CEA: CEA: 2.4 ng/mL

## 2018-09-19 NOTE — Telephone Encounter (Signed)
Jesse Wheeler has been scheduled to see Demetre Monaco, NP/Dr.Sherrill on 5/14 at 2pm. Appt date and time has been given to the pt's wife.

## 2018-09-20 ENCOUNTER — Telehealth: Payer: Self-pay | Admitting: *Deleted

## 2018-09-20 NOTE — Telephone Encounter (Signed)
Covid-19 travel screening questions  Have you traveled in the last 14 days? If yes where? no Do you now or have you had a fever in the last 14 days? No Do you have any respiratory symptoms of shortness of breath or cough now or in the last 14 days? No  Do you have any family members or close contacts with diagnosed or suspected Covid-19?  No

## 2018-09-21 ENCOUNTER — Ambulatory Visit (INDEPENDENT_AMBULATORY_CARE_PROVIDER_SITE_OTHER)
Admission: RE | Admit: 2018-09-21 | Discharge: 2018-09-21 | Disposition: A | Discharge status: Home / Self Care | Payer: Medicare Other | Source: Ambulatory Visit | Attending: Internal Medicine | Admitting: Internal Medicine

## 2018-09-21 ENCOUNTER — Other Ambulatory Visit: Payer: Self-pay

## 2018-09-21 DIAGNOSIS — C169 Malignant neoplasm of stomach, unspecified: Secondary | ICD-10-CM | POA: Diagnosis not present

## 2018-09-24 NOTE — Progress Notes (Signed)
Sheri,  Please let him know we have good news that based upon this scan do not see signs of spread of the cancer.  I believe he will need either a PET scan next or an EUS but we will wait until he sees oncology (5/14).  I do want to make a referral to Dr. Barry Dienes of surgery to see him also.  I have cced Ned Card NP (with Dr. Benay Spice), Dr. Barry Dienes and Dr. Ardis Hughs so they are in the loop and can make any other necessary suggestions  CEG

## 2018-09-24 NOTE — Progress Notes (Signed)
Patient aware of gastric cancer diagnosis No letter needed and no recall Has been referred to oncology and surgery

## 2018-09-25 ENCOUNTER — Telehealth: Payer: Self-pay | Admitting: Neurology

## 2018-09-25 ENCOUNTER — Other Ambulatory Visit: Payer: Self-pay

## 2018-09-25 ENCOUNTER — Telehealth: Payer: Self-pay | Admitting: *Deleted

## 2018-09-25 ENCOUNTER — Telehealth: Payer: Self-pay | Admitting: Cardiology

## 2018-09-25 DIAGNOSIS — C169 Malignant neoplasm of stomach, unspecified: Secondary | ICD-10-CM

## 2018-09-25 DIAGNOSIS — Z7901 Long term (current) use of anticoagulants: Secondary | ICD-10-CM

## 2018-09-25 NOTE — Telephone Encounter (Signed)
ok 

## 2018-09-25 NOTE — Telephone Encounter (Signed)
1. Have you developed a fever since your procedure? no  2.   Have you had an respiratory symptoms (SOB or cough) since your procedure? no  3.   Have you tested positive for COVID 19 since your procedure no  3.   Have you had any family members/close contacts diagnosed with the COVID 19 since your procedure?  no   If any of these questions are a yes, please inquire if patient has been seen by family doctor and route this note to Tracy Walton, RN.  

## 2018-09-25 NOTE — Telephone Encounter (Signed)
As he is a high risk patient and unable to participate in virtual visit, please schedule telemedicine visit via telephone for surgery clearance.

## 2018-09-25 NOTE — Telephone Encounter (Signed)
Called pt to schedule apt for Doxy.me pt is unable to do the virtual video apt and does not have anyone who can help them, pt is high risk for covid 19 has a score of 7-16, the referral came over as urgent for pt to get clearence for surgery for stomach cancer, please advise how to proceed with this.

## 2018-09-25 NOTE — Telephone Encounter (Signed)
LVM to advised patient his appt from 5/15 was rescheduled to 5/18 with Dr. Percival Spanish as he was scheduled in error on the 15th of May.  (he was doubled booked).  If the new time doesn't work please reschedule him to a time that does.

## 2018-09-27 ENCOUNTER — Telehealth: Payer: Self-pay | Admitting: Cardiology

## 2018-09-27 ENCOUNTER — Inpatient Hospital Stay: Payer: Medicare Other | Attending: Nurse Practitioner | Admitting: Nurse Practitioner

## 2018-09-27 ENCOUNTER — Encounter: Payer: Self-pay | Admitting: Nurse Practitioner

## 2018-09-27 ENCOUNTER — Telehealth: Payer: Self-pay

## 2018-09-27 ENCOUNTER — Other Ambulatory Visit: Payer: Self-pay

## 2018-09-27 VITALS — BP 129/67 | HR 96 | Temp 98.2°F | Resp 18 | Ht 68.5 in | Wt 304.6 lb

## 2018-09-27 DIAGNOSIS — E1122 Type 2 diabetes mellitus with diabetic chronic kidney disease: Secondary | ICD-10-CM

## 2018-09-27 DIAGNOSIS — C163 Malignant neoplasm of pyloric antrum: Secondary | ICD-10-CM | POA: Diagnosis not present

## 2018-09-27 DIAGNOSIS — R918 Other nonspecific abnormal finding of lung field: Secondary | ICD-10-CM

## 2018-09-27 DIAGNOSIS — E785 Hyperlipidemia, unspecified: Secondary | ICD-10-CM

## 2018-09-27 DIAGNOSIS — D509 Iron deficiency anemia, unspecified: Secondary | ICD-10-CM

## 2018-09-27 DIAGNOSIS — I4891 Unspecified atrial fibrillation: Secondary | ICD-10-CM

## 2018-09-27 DIAGNOSIS — N189 Chronic kidney disease, unspecified: Secondary | ICD-10-CM

## 2018-09-27 DIAGNOSIS — I129 Hypertensive chronic kidney disease with stage 1 through stage 4 chronic kidney disease, or unspecified chronic kidney disease: Secondary | ICD-10-CM

## 2018-09-27 DIAGNOSIS — Z7901 Long term (current) use of anticoagulants: Secondary | ICD-10-CM

## 2018-09-27 NOTE — Progress Notes (Addendum)
New Hematology/Oncology Consult   Requesting MD: Dr. Carlean Purl    Reason for Consult: Gastric cancer  HPI:  [name] is a 78 year old man with a history of iron deficiency anemia.  He reports undergoing 2 unsuccessful capsule endoscopies.  He underwent an upper endoscopy by Dr. Carlean Purl on 09/13/2018 with findings of 1 nonbleeding cratered gastric ulcer with no stigmata of bleeding, located at the posterior wall of the gastric antrum, measuring 12 mm.  The exam was otherwise without abnormality.  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  CT scans without contrast on 09/21/2018 showed prominent mediastinal lymph nodes, multiple small bilateral pulmonary nodules, some clearly benign and calcified.  The largest noncalcified nodule was in the superior segment right lower lobe measuring 7 mm.  Primary gastric mass was not seen.  There was no definite evidence of metastatic disease in the chest, abdomen or pelvis.  Mild pulmonary fibrosis noted.  CEA 2.4 on 09/18/2018.      Past Medical History:  Diagnosis Date  . Actinic keratoses 08/09/2013   Overview:  IMPRESSION: Liquid nitrogen x2   . Acute inferior myocardial infarction ([place])    stenting in 2002 to the RCA  . Anemia   . Bowen's disease   . Cataract   . Coronary artery disease   . Diabetes mellitus    type II  . Dupuytren's contracture of left hand   . Heart murmur   . History of colonic polyps 04/14/2014  . Hypercholesterolemia   . Hyperlipidemia   . Hypertension    LCX  . Obesity   . Onychomycosis   . ONYCHOMYCOSIS 09/07/2006   Qualifier: Diagnosis of  By: Cletus Gash MD, [place]    . Panic disorder   . Peyronie's disease   . Prostatitis, chronic   . Right bundle branch block   . S/P coronary angioplasty   . Sleep apnea   . Stroke [facility])    takes xarelto  . Tinnitus   :   Past Surgical History:  Procedure Laterality Date  . CATARACT EXTRACTION W/ INTRAOCULAR LENS  IMPLANT, BILATERAL    . CHOLECYSTECTOMY    .  COLONOSCOPY  03/09/2004   Normal  . CORONARY ANGIOPLASTY  01/02/01   Acute inferior MI -- Stent placement in the proximal RCA (three separate stents totalling approximately 55 mm of stent with residual thrombus in the posterior descending artery) --  Relatively mild inferior basilar hypokinesia --Severe stenosis in a secondary marginal vessel of the left circumflex -- Minimal coronary atherosclerosis otherwise of the coronary system   . ESOPHAGOGASTRODUODENOSCOPY    . FASCIECTOMY Right 05/01/2018   Procedure: FASCIECTOMY RIGHT RING FINGER;  Surgeon: Daryll Brod, MD;  Location: [place];  Service: Orthopedics;  Laterality: Right;  . Nuclear stress test  Oct 2011    Dense inferior infarct; mild ischemia with dilated LV. EF was 39%. Study was felt to be unchanged and he has been managed medically  . TRIGGER FINGER RELEASE Right 05/01/2018   Procedure: RELEASE TRIGGER FINGER RIGHT RING FINGER;  Surgeon: Daryll Brod, MD;  Location: [facility];  Service: Orthopedics;  Laterality: Right;  :   Current Outpatient Medications:  .  aspirin EC 81 MG tablet, Take 81 mg by mouth., Disp: , Rfl:  .  Blood Glucose Monitoring Suppl (FIFTY50 GLUCOSE METER 2.0) W/DEVICE KIT, by Does not apply route., Disp: , Rfl:  .  Cyanocobalamin (VITAMIN B-12 PO), Take 1 tablet by mouth daily.  , Disp: , Rfl:  .  doxazosin (CARDURA) 8 MG tablet, Take 8 mg by mouth daily. , Disp: , Rfl:  .  ergocalciferol (VITAMIN D2) 50000 units capsule, Take 50,000 Units by mouth once a week., Disp: , Rfl:  .  ezetimibe (ZETIA) 10 MG tablet, Take 10 mg by mouth daily. , Disp: , Rfl:  .  furosemide (LASIX) 20 MG tablet, Take 20 mg by mouth daily. , Disp: , Rfl:  .  gabapentin (NEURONTIN) 300 MG capsule, Take 600 mg by mouth 3 (three) times daily. , Disp: , Rfl:  .  insulin aspart (NOVOLOG) 100 UNIT/ML injection, Inject 6 Units into the skin 3 (three) times daily with meals., Disp: , Rfl:  .  insulin glargine  (LANTUS) 100 UNIT/ML injection, Inject 50 Units into the skin 2 (two) times daily. , Disp: , Rfl:  .  isosorbide mononitrate (IMDUR) 30 MG 24 hr tablet, TAKE 2 TABLETS PO DAILY, Disp: 60 tablet, Rfl: 9 .  lisinopril (PRINIVIL,ZESTRIL) 20 MG tablet, Take 1 tablet (20 mg total) by mouth daily., Disp: 30 tablet, Rfl: 6 .  metoprolol succinate (TOPROL-XL) 25 MG 24 hr tablet, Take 12.5 mg by mouth daily., Disp: , Rfl:  .  omeprazole (PRILOSEC) 40 MG capsule, Take 1 capsule (40 mg total) by mouth daily., Disp: 90 capsule, Rfl: 3 .  ONETOUCH VERIO test strip, , Disp: , Rfl: 1 .  Rivaroxaban (XARELTO) 15 MG TABS tablet, Take 1 tablet by mouth once daily with a meal, Disp: 90 tablet, Rfl: 3 .  Semaglutide (OZEMPIC) 1 MG/DOSE SOPN, Inject 1 mg into the skin once a week. OZEMPIC, Disp: , Rfl:  .  senna-docusate (SENOKOT-S) 8.6-50 MG tablet, Take 1 tablet by mouth as needed for mild constipation., Disp: , Rfl:  .  simvastatin (ZOCOR) 40 MG tablet, Take 40 mg by mouth daily., Disp: , Rfl:  .  temazepam (RESTORIL) 15 MG capsule, Take 15 mg by mouth at bedtime as needed for sleep., Disp: , Rfl:  .  HYDROcodone-acetaminophen (NORCO) 5-325 MG tablet, Take 1 tablet by mouth every 6 (six) hours as needed. (Patient not taking: Reported on 09/13/2018), Disp: 20 tablet, Rfl: 0 .  nitroGLYCERIN (NITROSTAT) 0.4 MG SL tablet, Place 1 tablet (0.4 mg total) under the tongue every 5 (five) minutes as needed. (Patient not taking: Reported on 09/13/2018), Disp: 25 tablet, Rfl: 1:   No Known Allergies:  FH: No family history of malignancy  SOCIAL HISTORY: He lives in [place].  He is married.  He has 1 son who is in good health.  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  He quit smoking 30 years ago, 1 pack/day for 20 years.  He drinks an occasional beer.  Review of Systems: No anorexia or weight loss.  No fevers.  No sweats.  He denies pain.  No unusual headaches or vision change.  He denies  dysphagia.  No nausea or vomiting.  He has dyspnea on exertion.  No cough.  No chest pain.  He has noted recent constipation which he attributes to a new medication.  No bloody or black bowel movements.  No hematuria or dysuria.  Feet are "sore".  He attributes this to diabetes.  Physical Exam:  Blood pressure 129/67, pulse 96, temperature 98.2 F (36.8 C), temperature source Oral, resp. rate 18, height 5' 8.5" (1.74 m), weight (!) 304 lb 9.6 oz (138.2 kg), SpO2 94 %.  HEENT: Drainage right eye. Cardiac: Irregular. Abdomen: Obese.  Nontender.  No hepatomegaly. Vascular:   No leg edema.  Skin changes consistent with chronic venous stasis at the lower legs. Lymph nodes: No palpable cervical, supraclavicular, axillary or inguinal lymph nodes. Neurologic: Alert and oriented. Skin: Erythematous rash bilateral groin.  LABS:  09/18/2018 CEA 2.4, hemoglobin 14.3, MCV 93.6 white count 8.0, platelets 152,000, creatinine 1.96  RADIOLOGY:  Ct Abdomen Pelvis Wo Contrast  Result Date: 09/21/2018 CLINICAL DATA:  New diagnosis, gastric adenocarcinoma EXAM: CT CHEST, ABDOMEN AND PELVIS WITHOUT CONTRAST TECHNIQUE: Multidetector CT imaging of the chest, abdomen and pelvis was performed following the standard protocol without IV contrast. COMPARISON:  CT abdomen pelvis, 11/10/2011 FINDINGS: CT CHEST FINDINGS Cardiovascular: Three-vessel coronary artery calcifications and/or stents. Cardiomegaly no pericardial effusion. Mediastinum/Nodes: Prominent mediastinal lymph nodes. Thyroid gland, trachea, and esophagus demonstrate no significant findings. Lungs/Pleura: Multiple small bilateral pulmonary nodules, some of which are clearly benign and calcified. The largest noncalcified nodule is in the superior segment right lower lobe, measuring 7 mm (series 4, image 65). There is mild pulmonary fibrosis in a pattern with apically to basal gradient, featuring irregular peripheral interstitial pulmonary opacity and traction  bronchiectasis. No clear evidence of honeycombing. No pleural effusion or pneumothorax. Musculoskeletal: No chest wall mass or suspicious bone lesions identified. CT ABDOMEN PELVIS FINDINGS Hepatobiliary: No focal liver abnormality is seen. Hepatic steatosis. Status post cholecystectomy. No biliary dilatation. Pancreas: Unremarkable. No pancreatic ductal dilatation or surrounding inflammatory changes. Spleen: Normal in size without focal abnormality. Adrenals/Urinary Tract: Adrenal glands are unremarkable. Mildly atrophic kidneys. No calculus or hydronephrosis. Bladder is unremarkable. Stomach/Bowel: Stomach is within normal limits. Appendix appears normal. No evidence of bowel wall thickening, distention, or inflammatory changes. Vascular/Lymphatic: Calcific atherosclerosis. No enlarged abdominal or pelvic lymph nodes. Reproductive: No mass or other abnormality. Other: No abdominal wall hernia or abnormality. No abdominopelvic ascites. Musculoskeletal: No acute or significant osseous findings. IMPRESSION: 1. Primary gastric mass is not appreciated by noncontrast CT. No evidence of lymphadenopathy or other definite evidence of metastatic disease in the chest, abdomen, or pelvis. 2. Multiple small bilateral pulmonary nodules, some of which are clearly benign and calcified. The largest noncalcified nodule is in the superior segment right lower lobe, measuring 7 mm (series 4, image 65). Attention on follow-up. 3. There is mild pulmonary fibrosis in a pattern with apically to basal gradient, featuring irregular peripheral interstitial pulmonary opacity and mild traction bronchiectasis. No clear evidence of honeycombing. Fibrotic findings in the bilateral lung bases are worsened as included on CT of the abdomen and pelvis dated 11/10/2011. 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 4.  Other chronic and incidental findings as detailed above. Electronically Signed   By: Alex   Bibbey M.D.   On: 09/21/2018 14:30   Ct Chest Wo Contrast  Result Date: 09/21/2018 CLINICAL DATA:  New diagnosis, gastric adenocarcinoma EXAM: CT CHEST, ABDOMEN AND PELVIS WITHOUT CONTRAST TECHNIQUE: Multidetector CT imaging of the chest, abdomen and pelvis was performed following the standard protocol without IV contrast. COMPARISON:  CT abdomen pelvis, 11/10/2011 FINDINGS: CT CHEST FINDINGS Cardiovascular: Three-vessel coronary artery calcifications and/or stents. Cardiomegaly no pericardial effusion. Mediastinum/Nodes: Prominent mediastinal lymph nodes. Thyroid gland, trachea, and esophagus demonstrate no significant findings. Lungs/Pleura: Multiple small bilateral pulmonary nodules, some of which are clearly benign and calcified. The largest noncalcified nodule is in the superior segment right lower lobe, measuring 7 mm (series 4, image 65). There is mild pulmonary fibrosis in a pattern with apically to basal gradient, featuring irregular peripheral interstitial pulmonary opacity and traction bronchiectasis. No clear   evidence of honeycombing. No pleural effusion or pneumothorax. Musculoskeletal: No chest wall mass or suspicious bone lesions identified. CT ABDOMEN PELVIS FINDINGS Hepatobiliary: No focal liver abnormality is seen. Hepatic steatosis. Status post cholecystectomy. No biliary dilatation. Pancreas: Unremarkable. No pancreatic ductal dilatation or surrounding inflammatory changes. Spleen: Normal in size without focal abnormality. Adrenals/Urinary Tract: Adrenal glands are unremarkable. Mildly atrophic kidneys. No calculus or hydronephrosis. Bladder is unremarkable. Stomach/Bowel: Stomach is within normal limits. Appendix appears normal. No evidence of bowel wall thickening, distention, or inflammatory changes. Vascular/Lymphatic: Calcific atherosclerosis. No enlarged abdominal or pelvic lymph nodes. Reproductive: No mass or other abnormality. Other: No abdominal wall hernia or abnormality. No  abdominopelvic ascites. Musculoskeletal: No acute or significant osseous findings. IMPRESSION: 1. Primary gastric mass is not appreciated by noncontrast CT. No evidence of lymphadenopathy or other definite evidence of metastatic disease in the chest, abdomen, or pelvis. 2. Multiple small bilateral pulmonary nodules, some of which are clearly benign and calcified. The largest noncalcified nodule is in the superior segment right lower lobe, measuring 7 mm (series 4, image 65). Attention on follow-up. 3. There is mild pulmonary fibrosis in a pattern with apically to basal gradient, featuring irregular peripheral interstitial pulmonary opacity and mild traction bronchiectasis. No clear evidence of honeycombing. Fibrotic findings in the bilateral lung bases are worsened as included on CT of the abdomen and pelvis dated 11/10/2011. 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 4.  Other chronic and incidental findings as detailed above. Electronically Signed   By: Alex  Bibbey M.D.   On: 09/21/2018 14:30    Assessment:   1. Gastric cancer   Upper endoscopy by Dr. Gessner on 09/13/2018-1 nonbleeding cratered gastric ulcer with no stigmata of bleeding, located at the posterior wall of the gastric antrum, measuring 12 mm.  The exam was otherwise without abnormality.  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    CT scans without contrast 09/21/2018-prominent mediastinal lymph nodes, multiple small bilateral pulmonary nodules, some clearly benign and calcified.  The largest noncalcified nodule was in the superior segment right lower lobe measuring 7 mm.  Primary gastric mass was not seen.  There was no definite evidence of metastatic disease in the chest, abdomen or pelvis.  Mild pulmonary fibrosis noted.    CEA 2.4 on 09/18/2018. 2. History of iron deficiency 3. Mild pulmonary fibrosis noted on chest CT 09/21/2018  4. Multiple small bilateral lung nodules on chest  CT 09/21/2018.  Per patient report he is followed at the [facility] with routine chest CTs to follow-up the lung nodules.   5. Hypertension 6. Diabetes 7. Obesity 8. CAD/MI 9. Atrial fibrillation on Xarelto 10. Hyperlipidemia 11. Chronic renal failure 12. History of CVA 2016  Disposition: Mr. Andrepont is a 77-year-old man with multiple comorbid conditions recently diagnosed with what appears to be early stage gastric cancer.  Dr. Sherrill reviewed the diagnosis with him at today's visit.  He has been referred to Dr. Byerly to see if he is a candidate for surgery.  We have requested HER-2/neu and MSI testing.  His case will be presented at the upcoming GI tumor conference.  We scheduled a return visit in 6 weeks with a plan to adjust to a sooner visit if he does not undergo surgery.  Patient seen with Dr. Sherrill.     Deem Marmol Norlan, NP 09/27/2018, 2:39 PM   This was a shared visit with Carloyn Lahue [place].  Mr. Cefalu was interviewed and examined.  He   was diagnosed with adenocarcinoma evolving a gastric ulcer after presenting with iron deficiency anemia.  He appears asymptomatic.  CT showed no evidence of distant metastatic disease.  He appears to have a early stage gastric cancer.  I recommend surgery if he is a surgical candidate.  Mr. Vanwagner has multiple comorbid condition which may preclude surgery.  He is scheduled to see Dr. Barry Dienes.  I will present his case at the GI tumor conference.  Julieanne Manson, MD

## 2018-09-27 NOTE — Telephone Encounter (Signed)
TC per Lattie Haw to pathology ([phone]) to request 2neu and MSI testing. Request done   Accession: [id number] ICD: C16.19 Stage: early stage, hasn't been clinically staged yet.

## 2018-09-27 NOTE — Telephone Encounter (Signed)
Spoke with Rosana Berger, HIM records received and will be scanned into pt charts

## 2018-09-27 NOTE — H&P (View-Only) (Signed)
New Hematology/Oncology Consult   Requesting MD: Dr. Carlean Purl    Reason for Consult: Gastric cancer  HPI:  Jesse Wheeler is a 78 year old man with a history of iron deficiency anemia.  He reports undergoing 2 unsuccessful capsule endoscopies.  He underwent an upper endoscopy by Dr. Carlean Purl on 09/13/2018 with findings of 1 nonbleeding cratered gastric ulcer with no stigmata of bleeding, located at the posterior wall of the gastric antrum, measuring 12 mm.  The exam was otherwise without abnormality.  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  CT scans without contrast on 09/21/2018 showed prominent mediastinal lymph nodes, multiple small bilateral pulmonary nodules, some clearly benign and calcified.  The largest noncalcified nodule was in the superior segment right lower lobe measuring 7 mm.  Primary gastric mass was not seen.  There was no definite evidence of metastatic disease in the chest, abdomen or pelvis.  Mild pulmonary fibrosis noted.  CEA 2.4 on 09/18/2018.      Past Medical History:  Diagnosis Date  . Actinic keratoses 08/09/2013   Overview:  IMPRESSION: Liquid nitrogen x2   . Acute inferior myocardial infarction ([place])    stenting in 2002 to the RCA  . Anemia   . Bowen's disease   . Cataract   . Coronary artery disease   . Diabetes mellitus    type II  . Dupuytren's contracture of left hand   . Heart murmur   . History of colonic polyps 04/14/2014  . Hypercholesterolemia   . Hyperlipidemia   . Hypertension    LCX  . Obesity   . Onychomycosis   . ONYCHOMYCOSIS 09/07/2006   Qualifier: Diagnosis of  By: Cletus Gash MD, [place]    . Panic disorder   . Peyronie's disease   . Prostatitis, chronic   . Right bundle branch block   . S/P coronary angioplasty   . Sleep apnea   . Stroke [facility])    takes xarelto  . Tinnitus   :   Past Surgical History:  Procedure Laterality Date  . CATARACT EXTRACTION W/ INTRAOCULAR LENS  IMPLANT, BILATERAL    . CHOLECYSTECTOMY    .  COLONOSCOPY  03/09/2004   Normal  . CORONARY ANGIOPLASTY  01/02/01   Acute inferior MI -- Stent placement in the proximal RCA (three separate stents totalling approximately 55 mm of stent with residual thrombus in the posterior descending artery) --  Relatively mild inferior basilar hypokinesia --Severe stenosis in a secondary marginal vessel of the left circumflex -- Minimal coronary atherosclerosis otherwise of the coronary system   . ESOPHAGOGASTRODUODENOSCOPY    . FASCIECTOMY Right 05/01/2018   Procedure: FASCIECTOMY RIGHT RING FINGER;  Surgeon: Daryll Brod, MD;  Location: [place];  Service: Orthopedics;  Laterality: Right;  . Nuclear stress test  Oct 2011    Dense inferior infarct; mild ischemia with dilated LV. EF was 39%. Study was felt to be unchanged and he has been managed medically  . TRIGGER FINGER RELEASE Right 05/01/2018   Procedure: RELEASE TRIGGER FINGER RIGHT RING FINGER;  Surgeon: Daryll Brod, MD;  Location: [facility];  Service: Orthopedics;  Laterality: Right;  :   Current Outpatient Medications:  .  aspirin EC 81 MG tablet, Take 81 mg by mouth., Disp: , Rfl:  .  Blood Glucose Monitoring Suppl (FIFTY50 GLUCOSE METER 2.0) W/DEVICE KIT, by Does not apply route., Disp: , Rfl:  .  Cyanocobalamin (VITAMIN B-12 PO), Take 1 tablet by mouth daily.  , Disp: , Rfl:  .  doxazosin (CARDURA) 8 MG tablet, Take 8 mg by mouth daily. , Disp: , Rfl:  .  ergocalciferol (VITAMIN D2) 50000 units capsule, Take 50,000 Units by mouth once a week., Disp: , Rfl:  .  ezetimibe (ZETIA) 10 MG tablet, Take 10 mg by mouth daily. , Disp: , Rfl:  .  furosemide (LASIX) 20 MG tablet, Take 20 mg by mouth daily. , Disp: , Rfl:  .  gabapentin (NEURONTIN) 300 MG capsule, Take 600 mg by mouth 3 (three) times daily. , Disp: , Rfl:  .  insulin aspart (NOVOLOG) 100 UNIT/ML injection, Inject 6 Units into the skin 3 (three) times daily with meals., Disp: , Rfl:  .  insulin glargine  (LANTUS) 100 UNIT/ML injection, Inject 50 Units into the skin 2 (two) times daily. , Disp: , Rfl:  .  isosorbide mononitrate (IMDUR) 30 MG 24 hr tablet, TAKE 2 TABLETS PO DAILY, Disp: 60 tablet, Rfl: 9 .  lisinopril (PRINIVIL,ZESTRIL) 20 MG tablet, Take 1 tablet (20 mg total) by mouth daily., Disp: 30 tablet, Rfl: 6 .  metoprolol succinate (TOPROL-XL) 25 MG 24 hr tablet, Take 12.5 mg by mouth daily., Disp: , Rfl:  .  omeprazole (PRILOSEC) 40 MG capsule, Take 1 capsule (40 mg total) by mouth daily., Disp: 90 capsule, Rfl: 3 .  ONETOUCH VERIO test strip, , Disp: , Rfl: 1 .  Rivaroxaban (XARELTO) 15 MG TABS tablet, Take 1 tablet by mouth once daily with a meal, Disp: 90 tablet, Rfl: 3 .  Semaglutide (OZEMPIC) 1 MG/DOSE SOPN, Inject 1 mg into the skin once a week. OZEMPIC, Disp: , Rfl:  .  senna-docusate (SENOKOT-S) 8.6-50 MG tablet, Take 1 tablet by mouth as needed for mild constipation., Disp: , Rfl:  .  simvastatin (ZOCOR) 40 MG tablet, Take 40 mg by mouth daily., Disp: , Rfl:  .  temazepam (RESTORIL) 15 MG capsule, Take 15 mg by mouth at bedtime as needed for sleep., Disp: , Rfl:  .  HYDROcodone-acetaminophen (NORCO) 5-325 MG tablet, Take 1 tablet by mouth every 6 (six) hours as needed. (Patient not taking: Reported on 09/13/2018), Disp: 20 tablet, Rfl: 0 .  nitroGLYCERIN (NITROSTAT) 0.4 MG SL tablet, Place 1 tablet (0.4 mg total) under the tongue every 5 (five) minutes as needed. (Patient not taking: Reported on 09/13/2018), Disp: 25 tablet, Rfl: 1:   No Known Allergies:  FH: No family history of malignancy  SOCIAL HISTORY: He lives in [place].  He is married.  He has 1 son who is in good health.  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  He quit smoking 30 years ago, 1 pack/day for 20 years.  He drinks an occasional beer.  Review of Systems: No anorexia or weight loss.  No fevers.  No sweats.  He denies pain.  No unusual headaches or vision change.  He denies  dysphagia.  No nausea or vomiting.  He has dyspnea on exertion.  No cough.  No chest pain.  He has noted recent constipation which he attributes to a new medication.  No bloody or black bowel movements.  No hematuria or dysuria.  Feet are "sore".  He attributes this to diabetes.  Physical Exam:  Blood pressure 129/67, pulse 96, temperature 98.2 F (36.8 C), temperature source Oral, resp. rate 18, height 5' 8.5" (1.74 m), weight (!) 304 lb 9.6 oz (138.2 kg), SpO2 94 %.  HEENT: Drainage right eye. Cardiac: Irregular. Abdomen: Obese.  Nontender.  No hepatomegaly. Vascular:  No leg edema.  Skin changes consistent with chronic venous stasis at the lower legs. Lymph nodes: No palpable cervical, supraclavicular, axillary or inguinal lymph nodes. Neurologic: Alert and oriented. Skin: Erythematous rash bilateral groin.  LABS:  09/18/2018 CEA 2.4, hemoglobin 14.3, MCV 93.6 white count 8.0, platelets 152,000, creatinine 1.96  RADIOLOGY:  Ct Abdomen Pelvis Wo Contrast  Result Date: 09/21/2018 CLINICAL DATA:  New diagnosis, gastric adenocarcinoma EXAM: CT CHEST, ABDOMEN AND PELVIS WITHOUT CONTRAST TECHNIQUE: Multidetector CT imaging of the chest, abdomen and pelvis was performed following the standard protocol without IV contrast. COMPARISON:  CT abdomen pelvis, 11/10/2011 FINDINGS: CT CHEST FINDINGS Cardiovascular: Three-vessel coronary artery calcifications and/or stents. Cardiomegaly no pericardial effusion. Mediastinum/Nodes: Prominent mediastinal lymph nodes. Thyroid gland, trachea, and esophagus demonstrate no significant findings. Lungs/Pleura: Multiple small bilateral pulmonary nodules, some of which are clearly benign and calcified. The largest noncalcified nodule is in the superior segment right lower lobe, measuring 7 mm (series 4, image 65). There is mild pulmonary fibrosis in a pattern with apically to basal gradient, featuring irregular peripheral interstitial pulmonary opacity and traction  bronchiectasis. No clear evidence of honeycombing. No pleural effusion or pneumothorax. Musculoskeletal: No chest wall mass or suspicious bone lesions identified. CT ABDOMEN PELVIS FINDINGS Hepatobiliary: No focal liver abnormality is seen. Hepatic steatosis. Status post cholecystectomy. No biliary dilatation. Pancreas: Unremarkable. No pancreatic ductal dilatation or surrounding inflammatory changes. Spleen: Normal in size without focal abnormality. Adrenals/Urinary Tract: Adrenal glands are unremarkable. Mildly atrophic kidneys. No calculus or hydronephrosis. Bladder is unremarkable. Stomach/Bowel: Stomach is within normal limits. Appendix appears normal. No evidence of bowel wall thickening, distention, or inflammatory changes. Vascular/Lymphatic: Calcific atherosclerosis. No enlarged abdominal or pelvic lymph nodes. Reproductive: No mass or other abnormality. Other: No abdominal wall hernia or abnormality. No abdominopelvic ascites. Musculoskeletal: No acute or significant osseous findings. IMPRESSION: 1. Primary gastric mass is not appreciated by noncontrast CT. No evidence of lymphadenopathy or other definite evidence of metastatic disease in the chest, abdomen, or pelvis. 2. Multiple small bilateral pulmonary nodules, some of which are clearly benign and calcified. The largest noncalcified nodule is in the superior segment right lower lobe, measuring 7 mm (series 4, image 65). Attention on follow-up. 3. There is mild pulmonary fibrosis in a pattern with apically to basal gradient, featuring irregular peripheral interstitial pulmonary opacity and mild traction bronchiectasis. No clear evidence of honeycombing. Fibrotic findings in the bilateral lung bases are worsened as included on CT of the abdomen and pelvis dated 11/10/2011. 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 4.  Other chronic and incidental findings as detailed above. Electronically Signed   By: Eddie Candle M.D.   On: 09/21/2018 14:30   Ct Chest Wo Contrast  Result Date: 09/21/2018 CLINICAL DATA:  New diagnosis, gastric adenocarcinoma EXAM: CT CHEST, ABDOMEN AND PELVIS WITHOUT CONTRAST TECHNIQUE: Multidetector CT imaging of the chest, abdomen and pelvis was performed following the standard protocol without IV contrast. COMPARISON:  CT abdomen pelvis, 11/10/2011 FINDINGS: CT CHEST FINDINGS Cardiovascular: Three-vessel coronary artery calcifications and/or stents. Cardiomegaly no pericardial effusion. Mediastinum/Nodes: Prominent mediastinal lymph nodes. Thyroid gland, trachea, and esophagus demonstrate no significant findings. Lungs/Pleura: Multiple small bilateral pulmonary nodules, some of which are clearly benign and calcified. The largest noncalcified nodule is in the superior segment right lower lobe, measuring 7 mm (series 4, image 65). There is mild pulmonary fibrosis in a pattern with apically to basal gradient, featuring irregular peripheral interstitial pulmonary opacity and traction bronchiectasis. No clear  evidence of honeycombing. No pleural effusion or pneumothorax. Musculoskeletal: No chest wall mass or suspicious bone lesions identified. CT ABDOMEN PELVIS FINDINGS Hepatobiliary: No focal liver abnormality is seen. Hepatic steatosis. Status post cholecystectomy. No biliary dilatation. Pancreas: Unremarkable. No pancreatic ductal dilatation or surrounding inflammatory changes. Spleen: Normal in size without focal abnormality. Adrenals/Urinary Tract: Adrenal glands are unremarkable. Mildly atrophic kidneys. No calculus or hydronephrosis. Bladder is unremarkable. Stomach/Bowel: Stomach is within normal limits. Appendix appears normal. No evidence of bowel wall thickening, distention, or inflammatory changes. Vascular/Lymphatic: Calcific atherosclerosis. No enlarged abdominal or pelvic lymph nodes. Reproductive: No mass or other abnormality. Other: No abdominal wall hernia or abnormality. No  abdominopelvic ascites. Musculoskeletal: No acute or significant osseous findings. IMPRESSION: 1. Primary gastric mass is not appreciated by noncontrast CT. No evidence of lymphadenopathy or other definite evidence of metastatic disease in the chest, abdomen, or pelvis. 2. Multiple small bilateral pulmonary nodules, some of which are clearly benign and calcified. The largest noncalcified nodule is in the superior segment right lower lobe, measuring 7 mm (series 4, image 65). Attention on follow-up. 3. There is mild pulmonary fibrosis in a pattern with apically to basal gradient, featuring irregular peripheral interstitial pulmonary opacity and mild traction bronchiectasis. No clear evidence of honeycombing. Fibrotic findings in the bilateral lung bases are worsened as included on CT of the abdomen and pelvis dated 11/10/2011. 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 4.  Other chronic and incidental findings as detailed above. Electronically Signed   By: Eddie Candle M.D.   On: 09/21/2018 14:30    Assessment:   1. Gastric cancer   Upper endoscopy by Dr. Carlean Purl on 09/13/2018-1 nonbleeding cratered gastric ulcer with no stigmata of bleeding, located at the posterior wall of the gastric antrum, measuring 12 mm.  The exam was otherwise without abnormality.  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    CT scans without contrast 09/21/2018-prominent mediastinal lymph nodes, multiple small bilateral pulmonary nodules, some clearly benign and calcified.  The largest noncalcified nodule was in the superior segment right lower lobe measuring 7 mm.  Primary gastric mass was not seen.  There was no definite evidence of metastatic disease in the chest, abdomen or pelvis.  Mild pulmonary fibrosis noted.    CEA 2.4 on 09/18/2018. 2. History of iron deficiency 3. Mild pulmonary fibrosis noted on chest CT 09/21/2018  4. Multiple small bilateral lung nodules on chest  CT 09/21/2018.  Per patient report he is followed at the [facility] with routine chest CTs to follow-up the lung nodules.   5. Hypertension 6. Diabetes 7. Obesity 8. CAD/MI 9. Atrial fibrillation on Xarelto 10. Hyperlipidemia 11. Chronic renal failure 12. History of CVA 2016  Disposition: Jesse Wheeler is a 78 year old man with multiple comorbid conditions recently diagnosed with what appears to be early stage gastric cancer.  Dr. Benay Spice reviewed the diagnosis with him at today's visit.  He has been referred to Dr. Barry Dienes to see if he is a candidate for surgery.  We have requested HER-2/neu and MSI testing.  His case will be presented at the upcoming GI tumor conference.  We scheduled a return visit in 6 weeks with a plan to adjust to a sooner visit if he does not undergo surgery.  Patient seen with Dr. Benay Spice.     Ned Card, NP 09/27/2018, 2:39 PM   This was a shared visit with Ned Card.  Jesse Wheeler was interviewed and examined.  He  was diagnosed with adenocarcinoma evolving a gastric ulcer after presenting with iron deficiency anemia.  He appears asymptomatic.  CT showed no evidence of distant metastatic disease.  He appears to have a early stage gastric cancer.  I recommend surgery if he is a surgical candidate.  Jesse Wheeler has multiple comorbid condition which may preclude surgery.  He is scheduled to see Dr. Barry Dienes.  I will present his case at the GI tumor conference.  Julieanne Manson, MD

## 2018-09-27 NOTE — Telephone Encounter (Signed)
[facility] will be sending over some records from the [place] for Dr Percival Spanish to look over before Mr Mcconaughy's appt on Monday. If questions please call Judeen Hammans at [place] at [phone]

## 2018-09-28 ENCOUNTER — Telehealth: Payer: Self-pay | Admitting: Nurse Practitioner

## 2018-09-28 ENCOUNTER — Telehealth: Payer: Medicare Other | Admitting: Cardiology

## 2018-09-28 NOTE — Telephone Encounter (Signed)
Scheduled appt per 5/14 los. ° °A calendar will be mailed out. °

## 2018-09-30 NOTE — Progress Notes (Signed)
Error

## 2018-10-01 ENCOUNTER — Encounter: Payer: Self-pay | Admitting: Cardiology

## 2018-10-01 ENCOUNTER — Telehealth: Payer: Medicare Other | Admitting: Cardiology

## 2018-10-01 ENCOUNTER — Other Ambulatory Visit: Payer: Self-pay

## 2018-10-01 DIAGNOSIS — C169 Malignant neoplasm of stomach, unspecified: Secondary | ICD-10-CM

## 2018-10-01 NOTE — Progress Notes (Signed)
I deem this urgent and should be ok to do so this can be shared with staff at hospital.  There are some potential changes in non-urgent scheduling - more to come

## 2018-10-02 ENCOUNTER — Telehealth: Payer: Self-pay

## 2018-10-02 NOTE — Telephone Encounter (Signed)
-----   Message from Milus Banister, MD sent at 10/02/2018  7:40 AM EDT ----- He should hold  his xarelto for 2 days prior to the June 4th EUS.  Thanks

## 2018-10-02 NOTE — Telephone Encounter (Signed)
See alternate note  

## 2018-10-05 ENCOUNTER — Telehealth: Payer: Self-pay | Admitting: Internal Medicine

## 2018-10-05 NOTE — Telephone Encounter (Signed)
I called - no answer at home # Left message on wife's cell that I was calling back

## 2018-10-05 NOTE — Telephone Encounter (Signed)
Pt requested to speak to Dr. Carlean Purl concerning his gastric cancer.   For documentation purpose: upcoming procedure scheduled at [facility] will be paid by the [place].    A message was left for Herma Ard at [place] [phone] ext [phone] to obtain [place] authorisation.

## 2018-10-09 NOTE — Telephone Encounter (Signed)
He wanted me to know that the [place] had approved to pay for his care since 5/20 - they sent a notice to [facility] - he wants [place] to know that 6/4 EUS should be billed to [facility]  I told him I did not handle this but would forward to staff so we were aware to nill the [place] - looks like we have requested that info  Am ccing Dr. Ardis Hughs as an Juluis Rainier also

## 2018-10-09 NOTE — Telephone Encounter (Signed)
Left message for [facility] 10/05/18--requested [place] auth #.

## 2018-10-11 ENCOUNTER — Telehealth: Payer: Self-pay | Admitting: Adult Health

## 2018-10-11 NOTE — Telephone Encounter (Signed)
Pt gave consent for telephone visit and insurance to be billed as such

## 2018-10-15 ENCOUNTER — Ambulatory Visit: Payer: Medicare Other | Admitting: Adult Health

## 2018-10-15 ENCOUNTER — Other Ambulatory Visit (HOSPITAL_COMMUNITY)
Admission: RE | Admit: 2018-10-15 | Discharge: 2018-10-15 | Disposition: A | Discharge status: Home / Self Care | Payer: Medicare Other | Source: Ambulatory Visit | Attending: Gastroenterology | Admitting: Gastroenterology

## 2018-10-15 ENCOUNTER — Other Ambulatory Visit: Payer: Self-pay

## 2018-10-15 DIAGNOSIS — Z1159 Encounter for screening for other viral diseases: Secondary | ICD-10-CM | POA: Diagnosis present

## 2018-10-15 NOTE — Progress Notes (Deleted)
[facility] [address] [place]. [place] [address] [phone]       OFFICE FOLLOW-UP NOTE  Jesse Wheeler Date of Birth:  1940-10-03 Medical Record Number:  [id number]   HPI: Prior visit 02/12/2015: Mr Yurkovich is a 78 year old Caucasian male seen today for follow-up following hospital admission for TIA and stroke in July 2016. Tracy Kinner is an 78 y.o. male history of diabetes mellitus, hypertension, hyperlipidemia, coronary artery disease and myocardial infarction, presenting with transient numbness and weakness involving his right upper and lower extremities as well as slurred speech and dysphagia. Symptoms lasted about 15-20 minutes then resolved. Onset was at 3 PM today. There's no previous history of stroke nor TIA. He's been taking aspirin 81 mg per day. CT scan of his head showed an area of hypodensity involving the right parietal region, suspicious for acute ischemic stroke. NIH stroke score at the time of this evaluation was 0. LSN: 3 PM on 12/14/2014 tPA Given: No: Deficits resolved.. CT scan of the head showed a hypodensity in the right parietal region worrisome for subacute to chronic infarct. 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 Transthoraxic echo showed normal ejection fraction. Carotid ultrasound showed no significant external cranial stenosis. M1 and A1 C was elevated at 7.2 LDL cholesterols 58 mg percent. Patient was started on dual antiplatelets therapy due to intracranial stenosis. He states his done well since discharge but subsequently was found to have atrial fibrillation and hence is anteverted therapy was changed to Xarelto which he seems to tolerating well without significant bleeding or bruising. He states his blood  pressure is under good control and today it is 105/69. He states his sugars are also well controlled. He has no complaints today. Update 09/23/2015 : He returns for follow-up after last visit 8 months ago. Continues to do well without recurrent stroke or TIAs. He was found to have paroxysmal to fibrillation on outpatient cardiac monitoring and started on Xarelto which is tolerating well with minor bruising but no bleeding episodes. States his blood pressure is well controlled and today it is 119/72. He is tolerating Zocor well without side effects but stated he last lipid profile showed LDL to be elevated at 124. He states his fasting sugars range between 110-140 and last for malignancy was 7.1. He is quite active and goes to the gym 3 times a week. He states is she did have edema and has not lost any weight. Did have a polysomnogram done 12 years ago which was negative for sleep apnea. He has not had any follow-up Doppler imaging of the brain or the neck since last year. Update 01/24/2017 ; he returns for follow-up after last visit a year ago. He continues to do well without recurrent stroke or TIA symptoms now for more than 2 years. He had a follow-up carotid ultrasound study and transcranial Doppler studies done on 10/16/15 that showed no significant extra can stenosis and only mild increase in pulsatility indexes without hemodynamically significant stenosis. He remains on Xarelto which is tolerating well without bleeding but does bruise easily. His blood pressure well controlled and today it is 107561. His starting Zocor well without muscle aches and pain. Last total cholesterol 124 mg percent. His diabetes also remains quite well controlled and last  hemoglobin A1c checked 3 weeks ago was 6.5. He has no new neurological complaints.  UPDATE 07/20/17: Patient requested an appointment today for numbness/tingling that radiates from right clavicle down into scapula.  Denies numbness or tingling going up into the neck  or down the arm.  Denies pain.  Denies recent trauma such as falling, lifting a heavy object or straining his neck.  Denies pain with palpation of area.  Denies increased numbness tingling or pain with neck range of motion or shoulder range of motion.  Patient states this has been occurring for the past week and a half.  He denies ever having this symptom before.  He has not sought out medical attention prior to this appointment for this complaint.  He states the numbness is worse in the morning when he first wakes up and slowly subsides throughout the day.  He states this numbness can come and go intermittently that lasts only a short while.  Patient does have a stroke and TIA history with the most recent occurring in 2016 with symptoms of right upper and lower extremity numbness, slurred speech and dysphasia.  MRI negative for acute infarct but did show subacute right parietal infarct.  Patient denies residual deficit at that time. Patient does continue to take Xarelto for atrial fibrillation and denies increase in bleeding or bruising.  Patient does continue to take simvastatin without increase in myalgias.  Blood pressure satisfactory at today's visit 100/62.  Most recent A1c 8.4, patient has appointment with endocrinology next week.  Patient has been on gabapentin for bilateral lower extremity diabetic neuropathy.  Prescription dose gabapentin 300 mg 3 times a day.  Patient states that he takes 300 mg twice a day with mild effect but does not take away pain completely.  Patient denies this numbness or tingling as debilitating and states he is still able to function normally.  Denies any other symptoms such as weakness, dysarthria, aphasia, or any other numbness or tingling.  Denies new or worsening stroke/TIA symptoms.   ROS:   14 system review of systems is positive for  Numbness on right clavicle down to scapula, and numbness in the feet only and all other systems negative  PMH:  Past Medical History:    Diagnosis Date   Actinic keratoses 08/09/2013   Overview:  IMPRESSION: Liquid nitrogen x2    Acute inferior myocardial infarction ([place])    stenting in 2002 to the RCA   Anemia    Bowen's disease    Cataract    Coronary artery disease    Diabetes mellitus    type II   Dupuytren's contracture of left hand    Heart murmur    History of colonic polyps 04/14/2014   Hypercholesterolemia    Hyperlipidemia    Hypertension    LCX   Obesity    Onychomycosis    ONYCHOMYCOSIS 09/07/2006   Qualifier: Diagnosis of  By: Cletus Gash MD, Luis     Panic disorder    Peyronie's disease    Prostatitis, chronic    Right bundle branch block    S/P coronary angioplasty    Sleep apnea    Stroke [facility])    takes xarelto   Tinnitus     Social History:  Social History   Socioeconomic History   Marital status: Married    Spouse name: Ethel   Number of children: 1   Years of education: Not on file   Highest education level: Not on file  Occupational History  Occupation: Retired  Scientist, product/process development strain: Not on [employer] insecurity:    Worry: Not on file    Inability: Not on file   Transportation needs:    Medical: Not on file    Non-medical: Not on file  Tobacco Use   Smoking status: Former Smoker    Types: Cigarettes    Last attempt to quit: 11/17/1981    Years since quitting: 36.9   Smokeless tobacco: Never Used  Substance and Sexual Activity   Alcohol use: No    Alcohol/week: 0.0 standard drinks   Drug use: No   Sexual activity: Not on file  Lifestyle   Physical activity:    Days per week: Not on file    Minutes per session: Not on file   Stress: Not on file  Relationships   Social connections:    Talks on phone: Not on file    Gets together: Not on file    Attends religious service: Not on file    Active member of club or organization: Not on file    Attends meetings of clubs or organizations: Not on file     Relationship status: Not on file   Intimate partner violence:    Fear of current or ex partner: Not on file    Emotionally abused: Not on file    Physically abused: Not on file    Forced sexual activity: Not on file  Other Topics Concern   Not on file  Social History Narrative   He is a retired Firefighter, 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   Daily caffeine drinks    He is married and he has 1 son    Medications:   Current Outpatient Medications on File Prior to Visit  Medication Sig Dispense Refill   aspirin EC 81 MG tablet Take 81 mg by mouth.     Blood Glucose Monitoring Suppl (FIFTY50 GLUCOSE METER 2.0) W/DEVICE KIT by Does not apply route.     Cyanocobalamin (VITAMIN B-12 PO) Take 1 tablet by mouth daily.       doxazosin (CARDURA) 8 MG tablet Take 8 mg by mouth daily.      ergocalciferol (VITAMIN D2) 50000 units capsule Take 50,000 Units by mouth once a week. Monday     ezetimibe (ZETIA) 10 MG tablet Take 10 mg by mouth daily.      furosemide (LASIX) 20 MG tablet Take 20 mg by mouth daily.      gabapentin (NEURONTIN) 300 MG capsule Take 600 mg by mouth 3 (three) times daily.      insulin aspart (NOVOLOG) 100 UNIT/ML injection Inject 6 Units into the skin 3 (three) times daily with meals.     insulin glargine (LANTUS) 100 UNIT/ML injection Inject 60-65 Units into the skin See admin instructions. 65 units in the morning, 60 units at bedtime     isosorbide mononitrate (IMDUR) 30 MG 24 hr tablet TAKE 2 TABLETS PO DAILY (Patient taking differently: Take 60 mg by mouth daily. TAKE 2 TABLETS PO DAILY) 60 tablet 9   lisinopril (PRINIVIL,ZESTRIL) 20 MG tablet Take 1 tablet (20 mg total) by mouth daily. 30 tablet 6   metoprolol succinate (TOPROL-XL) 25 MG 24 hr tablet Take 12.5 mg by mouth daily.     nitroGLYCERIN (NITROSTAT) 0.4 MG SL tablet Place 1 tablet (0.4 mg total) under the tongue every 5 (five) minutes as needed.  25 tablet  1   omeprazole (PRILOSEC) 40 MG capsule Take 1 capsule (40 mg total) by mouth daily. 90 capsule 3   ONETOUCH VERIO test strip   1   Rivaroxaban (XARELTO) 15 MG TABS tablet Take 1 tablet by mouth once daily with a meal (Patient taking differently: Take 15 mg by mouth daily with supper. Take 1 tablet by mouth once daily with a meal) 90 tablet 3   Semaglutide (OZEMPIC) 1 MG/DOSE SOPN Inject 1 mg into the skin once a week. OZEMPIC     senna-docusate (SENOKOT-S) 8.6-50 MG tablet Take 1 tablet by mouth daily as needed for mild constipation.      simvastatin (ZOCOR) 40 MG tablet Take 40 mg by mouth daily.     temazepam (RESTORIL) 15 MG capsule Take 15 mg by mouth at bedtime as needed for sleep.     No current facility-administered medications on file prior to visit.     Allergies:  No Known Allergies  Physical Exam General: Obese elderly Caucasian male seated, in no evident distress Head: head normocephalic and atraumatic.  Neck: supple with no carotid or supraclavicular bruits Cardiovascular: regular rate and rhythm, no murmurs Musculoskeletal: no deformity; full range of motion in neck and bilateral upper extremities Skin:  no rash/petichiae Vascular:  Normal pulses all extremities There were no vitals filed for this visit. Neurologic Exam Mental Status: Awake and fully alert. Oriented to place and time. Recent and remote memory intact. Attention span, concentration and fund of knowledge appropriate. Mood and affect appropriate.  Cranial Nerves: Fundoscopic exam Not done. Pupils equal, briskly reactive to light. Extraocular movements full without nystagmus. Visual fields full to confrontation. Hearing intact. Facial sensation intact. Face, tongue, palate moves normally and symmetrically.  Motor: Normal bulk and tone. Normal strength in all tested extremity muscles.  Sensory.: Diminished touch ,pinprick .position and vibratory sensation from ankle down bilaterally.  Denies  diminished sensation around right clavicle down scapula. Coordination: Rapid alternating movements normal in all extremities. Finger-to-nose and heel-to-shin performed accurately bilaterally. Gait and Station: Arises from chair without difficulty. Stance is normal. Gait demonstrates normal stride length and balance . Unable ble to heel, toe and tandem walk with  difficulty.  Reflexes: 1+ and symmetric except both ankle jerks are depressed. Toes downgoing.       ASSESSMENT: 78 year male with left hemispheric TIA in July 2016 secondary to symptomatic left cavernous carotid stenosis. He also had a silent right parietal infarct and subsequently was found to have paroxysmal atrial fibrillation on outpatient cardiac monitoring. Is doing well from neurovascular standpoint. Multiple vascular risk factors of atrial fibrillation, cavernous carotid stenosis, diabetes, hypertension, hyperlipidemia and obesity.  Patient is being seen today for new complaint of right sided numbness rating from clavicle to scapula that has been present for approximately 1.5 weeks.  Denies trauma or numbness rating down the arm or up into the neck.    PLAN: As his pain has been present for 1.5 weeks and is not radiating down into arm or up into the neck, we will conservatively treat at this time.  Is recommended patient take Tylenol as needed for pain and use heat/ice.  Patient was provided with neck stretches and shoulder stretches. Emphasized that patient ensure he has a supportive pillow at night, and as he is a side sleeper, it is recommended that patient uses a pillow underneath his top arm in order to better align his body.  Patient was advised that if numbness or tingling gets worse or does  go down into arm or of neck, experience weakness on one side of the body, or unable to speak/speech difficulties to call 911 immediately to be evaluated for possible stroke.  Patient also advised to call office with any concerns or questions  but otherwise to follow-up in 6-8 weeks.  We will consider physical therapy or imaging at that time if needed.   Venancio Poisson, AGNP-BC  [facility] [address] [place], [place] [address]  Phone [phone] Fax [phone]    Note: This document was prepared with digital dictation and possible smart phrase technology. Any transcriptional errors that result from this process are unintentional

## 2018-10-15 NOTE — Progress Notes (Signed)
[facility] [address] [place]. [place] [address] [phone]       SURGICAL CLEARANCE  [name] Date of Birth:  Apr 07, 1941 Medical Record Number:  [id number]    [facility] [address] [place] [address] [phone]     Virtual Visit via Telephone Note  I connected with Jesse Wheeler on 10/16/18 at  1:45 PM EDT by telephone located remotely within my own home and verified that I am speaking with the correct person using two identifiers who reports being located within his own home and accompanied by his wife.    Visit scheduled by Preston Fleeting (referral department). She discussed the limitations, risks, security and privacy concerns of performing an evaluation and management service by telephone and the availability of in person appointments. I also discussed with the patient that there may be a patient responsible charge related to this service. The patient expressed understanding and agreed to proceed. See telephone note for consent and additional scheduling information.     HPI: Jesse Wheeler is an 78 y.o. male history of diabetes mellitus, hypertension, hyperlipidemia, coronary artery disease, myocardial infarction, atrial fibrillation on Xarelto, and TIA and stroke in 11/2014 and recent diagnosis of gastric cancer.  He requested visit today for surgical clearance prior to endoscopy.   He has remained stable from a stroke standpoint without residual or new stroke/TIA symptoms since 2016.  He remains on Xarelto for secondary stroke prevention and atrial fibrillation.  He remains on simvastatin for HLD management without side effects myalgias. 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  He plans on undergoing repeat endoscopy and needs clearance from a neurological standpoint to discontinue Xarelto prior to procedure.     PMH:  Past Medical History:  Diagnosis Date  . Actinic keratoses 08/09/2013   Overview:  IMPRESSION: Liquid nitrogen x2   . Acute inferior myocardial infarction ([place])    stenting in 2002 to the RCA  . Anemia   . Bowen's disease   . Cataract   . Coronary artery disease   . Diabetes mellitus    type II  . Dupuytren's contracture of left hand   . Heart murmur   . History of colonic polyps 04/14/2014  . Hypercholesterolemia   . Hyperlipidemia   . Hypertension    LCX  . Obesity   . Onychomycosis   . ONYCHOMYCOSIS 09/07/2006   Qualifier: Diagnosis of  By: Cletus Gash MD, [place]    . Panic disorder   . Peyronie's disease   . Prostatitis, chronic   . Right bundle branch block   . S/P coronary angioplasty   . Sleep apnea   . Stroke [facility])    takes xarelto  . Tinnitus     Social History:  Social History   Socioeconomic History  . Marital status: Married    Spouse name: Meryl Crutch  . Number of children: 1  . Years of education: Not on file  . Highest education level: Not on file  Occupational History  . Occupation: Retired  Scientific laboratory technician  . Financial resource strain: Not on file  . Food insecurity:    Worry: Not on file    Inability: Not on file  . Transportation needs:    Medical: Not on file    Non-medical: Not on file  Tobacco Use  . Smoking status: Former Smoker    Types: Cigarettes    Last attempt  to quit: 11/17/1981    Years since quitting: 36.9  . Smokeless tobacco: Never Used  Substance and Sexual Activity  . Alcohol use: No    Alcohol/week: 0.0 standard drinks  . Drug use: No  . Sexual activity: Not on file  Lifestyle  . Physical activity:    Days per week: Not on file    Minutes per session: Not on file  . Stress: Not on file  Relationships  . Social connections:    Talks on phone: Not on file    Gets together: Not on file    Attends religious service: Not on file    Active member of club or organization: Not on file    Attends meetings of clubs or  organizations: Not on file    Relationship status: Not on file  . Intimate partner violence:    Fear of current or ex partner: Not on file    Emotionally abused: Not on file    Physically abused: Not on file    Forced sexual activity: Not on file  Other Topics Concern  . Not on file  Social History Narrative   He is a retired Firefighter, he also served as a Psychiatrist.   Daily caffeine drinks    He is married and he has 1 son    Medications:   Current Outpatient Medications on File Prior to Visit  Medication Sig Dispense Refill  . aspirin EC 81 MG tablet Take 81 mg by mouth.    . Blood Glucose Monitoring Suppl (FIFTY50 GLUCOSE METER 2.0) W/DEVICE KIT by Does not apply route.    . Cyanocobalamin (VITAMIN B-12 PO) Take 1 tablet by mouth daily.      Marland Kitchen doxazosin (CARDURA) 8 MG tablet Take 8 mg by mouth daily.     . ergocalciferol (VITAMIN D2) 50000 units capsule Take 50,000 Units by mouth once a week. Monday    . ezetimibe (ZETIA) 10 MG tablet Take 10 mg by mouth daily.     . furosemide (LASIX) 20 MG tablet Take 20 mg by mouth daily.     Marland Kitchen gabapentin (NEURONTIN) 300 MG capsule Take 600 mg by mouth 3 (three) times daily.     . insulin aspart (NOVOLOG) 100 UNIT/ML injection Inject 6 Units into the skin 3 (three) times daily with meals.    . insulin glargine (LANTUS) 100 UNIT/ML injection Inject 60-65 Units into the skin See admin instructions. 65 units in the morning, 60 units at bedtime    . isosorbide mononitrate (IMDUR) 30 MG 24 hr tablet TAKE 2 TABLETS PO DAILY (Patient taking differently: Take 60 mg by mouth daily. TAKE 2 TABLETS PO DAILY) 60 tablet 9  . lisinopril (PRINIVIL,ZESTRIL) 20 MG tablet Take 1 tablet (20 mg total) by mouth daily. 30 tablet 6  . metoprolol succinate (TOPROL-XL) 25 MG 24 hr tablet Take 12.5 mg by mouth daily.    . nitroGLYCERIN (NITROSTAT) 0.4 MG SL tablet Place 1 tablet (0.4 mg total) under the tongue  every 5 (five) minutes as needed. 25 tablet 1  . omeprazole (PRILOSEC) 40 MG capsule Take 1 capsule (40 mg total) by mouth daily. 90 capsule 3  . ONETOUCH VERIO test strip   1  . Rivaroxaban (XARELTO) 15 MG TABS tablet Take 1 tablet by mouth once daily with a meal (Patient taking differently: Take 15 mg by mouth daily with supper. Take 1 tablet by mouth once daily with a meal)  90 tablet 3  . Semaglutide (OZEMPIC) 1 MG/DOSE SOPN Inject 1 mg into the skin once a week. [place]    . senna-docusate (SENOKOT-S) 8.6-50 MG tablet Take 1 tablet by mouth daily as needed for mild constipation.     . simvastatin (ZOCOR) 40 MG tablet Take 40 mg by mouth daily.    . temazepam (RESTORIL) 15 MG capsule Take 15 mg by mouth at bedtime as needed for sleep.     No current facility-administered medications on file prior to visit.     Allergies:  No Known Allergies  OBSERVATION: Limited due to visit type  General: Pleasant elderly male asking and answering questions appropriately throughout conversation Mental status: Alert and oriented x3    ASSESSMENT: 78 year male with left hemispheric TIA in July 2016 secondary to symptomatic left cavernous carotid stenosis. He also had a silent right parietal infarct and subsequently was found to have paroxysmal atrial fibrillation on outpatient cardiac monitoring. Is doing well from neurovascular standpoint. Multiple vascular risk factors of atrial fibrillation, cavernous carotid stenosis, diabetes, hypertension, hyperlipidemia and obesity.  He has been stable from a neurological standpoint without new or recurrent stroke/TIA symptoms.  He is requesting clearance prior to endoscopy with recent diagnosis of gastric cancer.    PLAN:   As he has been stable from a neurological standpoint, he is cleared to discontinue Xarelto 2 days prior to procedure and restart immediately after once cleared by GI provider.  There is a small but acceptable periprocedural risk of recurrent  stroke while off anticoagulation.  He will follow-up in office as needed   I discussed the assessment and treatment plan with the patient and wife.  The patient was provided an opportunity to ask questions and all were answered to their satisfaction. The patient agreed with the plan and verbalized an understanding of the instructions.   I provided 11 minutes of non-face-to-face time during this encounter.   Venancio Poisson, AGNP-BC  [facility] [address] [place], [place] [address]  Phone [phone] Fax [phone]    Note: This document was prepared with digital dictation and possible smart phrase technology. Any transcriptional errors that result from this process are unintentional

## 2018-10-16 ENCOUNTER — Encounter (HOSPITAL_COMMUNITY): Payer: Self-pay | Admitting: *Deleted

## 2018-10-16 ENCOUNTER — Ambulatory Visit (INDEPENDENT_AMBULATORY_CARE_PROVIDER_SITE_OTHER): Payer: Medicare Other | Admitting: Adult Health

## 2018-10-16 ENCOUNTER — Other Ambulatory Visit: Payer: Self-pay

## 2018-10-16 ENCOUNTER — Encounter: Payer: Self-pay | Admitting: Adult Health

## 2018-10-16 DIAGNOSIS — Z01818 Encounter for other preprocedural examination: Secondary | ICD-10-CM

## 2018-10-16 DIAGNOSIS — Z8673 Personal history of transient ischemic attack (TIA), and cerebral infarction without residual deficits: Secondary | ICD-10-CM

## 2018-10-16 NOTE — Progress Notes (Signed)
NO NEW EKG NEEDED FOR 09-17-18 PROCEDURE PER JESSICA ZANETTO PA.

## 2018-10-17 LAB — NOVEL CORONAVIRUS, NAA (HOSP ORDER, SEND-OUT TO REF LAB; TAT 18-24 HRS): SARS-CoV-2, NAA: NOT DETECTED

## 2018-10-17 NOTE — Progress Notes (Signed)
Talked with wife about procedure tomorrow. She states that he has had no new symptoms or fever. She states he has been quarantined since his covid test.

## 2018-10-17 NOTE — Progress Notes (Signed)
I agree with the above plan 

## 2018-10-18 ENCOUNTER — Ambulatory Visit (HOSPITAL_COMMUNITY)
Admission: RE | Admit: 2018-10-18 | Discharge: 2018-10-18 | Disposition: A | Discharge status: Home / Self Care | Payer: No Typology Code available for payment source | Attending: Gastroenterology | Admitting: Gastroenterology

## 2018-10-18 ENCOUNTER — Encounter (HOSPITAL_COMMUNITY)
Admission: RE | Disposition: A | Discharge status: Home / Self Care | Payer: Self-pay | Source: Home / Self Care | Attending: Gastroenterology

## 2018-10-18 ENCOUNTER — Ambulatory Visit (HOSPITAL_COMMUNITY): Payer: No Typology Code available for payment source | Admitting: Certified Registered Nurse Anesthetist

## 2018-10-18 ENCOUNTER — Encounter (HOSPITAL_COMMUNITY): Payer: Self-pay | Admitting: Certified Registered Nurse Anesthetist

## 2018-10-18 ENCOUNTER — Other Ambulatory Visit: Payer: Self-pay

## 2018-10-18 DIAGNOSIS — Z86010 Personal history of colonic polyps: Secondary | ICD-10-CM | POA: Insufficient documentation

## 2018-10-18 DIAGNOSIS — I129 Hypertensive chronic kidney disease with stage 1 through stage 4 chronic kidney disease, or unspecified chronic kidney disease: Secondary | ICD-10-CM | POA: Diagnosis not present

## 2018-10-18 DIAGNOSIS — E669 Obesity, unspecified: Secondary | ICD-10-CM | POA: Insufficient documentation

## 2018-10-18 DIAGNOSIS — G4730 Sleep apnea, unspecified: Secondary | ICD-10-CM | POA: Diagnosis not present

## 2018-10-18 DIAGNOSIS — D049 Carcinoma in situ of skin, unspecified: Secondary | ICD-10-CM | POA: Diagnosis not present

## 2018-10-18 DIAGNOSIS — Z794 Long term (current) use of insulin: Secondary | ICD-10-CM | POA: Diagnosis not present

## 2018-10-18 DIAGNOSIS — J479 Bronchiectasis, uncomplicated: Secondary | ICD-10-CM | POA: Insufficient documentation

## 2018-10-18 DIAGNOSIS — C169 Malignant neoplasm of stomach, unspecified: Secondary | ICD-10-CM

## 2018-10-18 DIAGNOSIS — E7800 Pure hypercholesterolemia, unspecified: Secondary | ICD-10-CM | POA: Diagnosis not present

## 2018-10-18 DIAGNOSIS — H9319 Tinnitus, unspecified ear: Secondary | ICD-10-CM | POA: Insufficient documentation

## 2018-10-18 DIAGNOSIS — K3189 Other diseases of stomach and duodenum: Secondary | ICD-10-CM

## 2018-10-18 DIAGNOSIS — Z7901 Long term (current) use of anticoagulants: Secondary | ICD-10-CM | POA: Insufficient documentation

## 2018-10-18 DIAGNOSIS — R011 Cardiac murmur, unspecified: Secondary | ICD-10-CM | POA: Diagnosis not present

## 2018-10-18 DIAGNOSIS — I4891 Unspecified atrial fibrillation: Secondary | ICD-10-CM | POA: Insufficient documentation

## 2018-10-18 DIAGNOSIS — D509 Iron deficiency anemia, unspecified: Secondary | ICD-10-CM | POA: Insufficient documentation

## 2018-10-18 DIAGNOSIS — I2510 Atherosclerotic heart disease of native coronary artery without angina pectoris: Secondary | ICD-10-CM | POA: Diagnosis not present

## 2018-10-18 DIAGNOSIS — N486 Induration penis plastica: Secondary | ICD-10-CM | POA: Diagnosis not present

## 2018-10-18 DIAGNOSIS — Z955 Presence of coronary angioplasty implant and graft: Secondary | ICD-10-CM | POA: Diagnosis not present

## 2018-10-18 DIAGNOSIS — F410 Panic disorder [episodic paroxysmal anxiety] without agoraphobia: Secondary | ICD-10-CM | POA: Insufficient documentation

## 2018-10-18 DIAGNOSIS — Z87891 Personal history of nicotine dependence: Secondary | ICD-10-CM | POA: Insufficient documentation

## 2018-10-18 DIAGNOSIS — I252 Old myocardial infarction: Secondary | ICD-10-CM | POA: Insufficient documentation

## 2018-10-18 DIAGNOSIS — Z7982 Long term (current) use of aspirin: Secondary | ICD-10-CM | POA: Insufficient documentation

## 2018-10-18 DIAGNOSIS — Z8673 Personal history of transient ischemic attack (TIA), and cerebral infarction without residual deficits: Secondary | ICD-10-CM | POA: Diagnosis not present

## 2018-10-18 DIAGNOSIS — K760 Fatty (change of) liver, not elsewhere classified: Secondary | ICD-10-CM | POA: Insufficient documentation

## 2018-10-18 DIAGNOSIS — Z79899 Other long term (current) drug therapy: Secondary | ICD-10-CM | POA: Insufficient documentation

## 2018-10-18 DIAGNOSIS — Z9049 Acquired absence of other specified parts of digestive tract: Secondary | ICD-10-CM | POA: Insufficient documentation

## 2018-10-18 DIAGNOSIS — I4510 Unspecified right bundle-branch block: Secondary | ICD-10-CM | POA: Diagnosis not present

## 2018-10-18 DIAGNOSIS — Z9842 Cataract extraction status, left eye: Secondary | ICD-10-CM | POA: Insufficient documentation

## 2018-10-18 DIAGNOSIS — N189 Chronic kidney disease, unspecified: Secondary | ICD-10-CM | POA: Insufficient documentation

## 2018-10-18 DIAGNOSIS — Z9841 Cataract extraction status, right eye: Secondary | ICD-10-CM | POA: Insufficient documentation

## 2018-10-18 DIAGNOSIS — Z8711 Personal history of peptic ulcer disease: Secondary | ICD-10-CM | POA: Insufficient documentation

## 2018-10-18 DIAGNOSIS — E785 Hyperlipidemia, unspecified: Secondary | ICD-10-CM | POA: Diagnosis not present

## 2018-10-18 DIAGNOSIS — E1122 Type 2 diabetes mellitus with diabetic chronic kidney disease: Secondary | ICD-10-CM | POA: Diagnosis not present

## 2018-10-18 DIAGNOSIS — J8410 Pulmonary fibrosis, unspecified: Secondary | ICD-10-CM | POA: Insufficient documentation

## 2018-10-18 DIAGNOSIS — Z6841 Body Mass Index (BMI) 40.0 and over, adult: Secondary | ICD-10-CM | POA: Insufficient documentation

## 2018-10-18 HISTORY — PX: ESOPHAGOGASTRODUODENOSCOPY (EGD) WITH PROPOFOL: SHX5813

## 2018-10-18 HISTORY — PX: EUS: SHX5427

## 2018-10-18 LAB — GLUCOSE, CAPILLARY: Glucose-Capillary: 145 mg/dL — ABNORMAL HIGH (ref 70–99)

## 2018-10-18 SURGERY — UPPER ENDOSCOPIC ULTRASOUND (EUS) RADIAL
Anesthesia: Monitor Anesthesia Care

## 2018-10-18 MED ORDER — PROPOFOL 10 MG/ML IV BOLUS
INTRAVENOUS | Status: DC | PRN
  Administered 2018-10-18: 40 mg via INTRAVENOUS

## 2018-10-18 MED ORDER — SODIUM CHLORIDE 0.9 % IV SOLN: INTRAVENOUS | Status: DC

## 2018-10-18 MED ORDER — PROPOFOL 500 MG/50ML IV EMUL
INTRAVENOUS | Status: DC | PRN
  Administered 2018-10-18: 125 ug/kg/min via INTRAVENOUS

## 2018-10-18 MED ORDER — LACTATED RINGERS IV SOLN
INTRAVENOUS | Status: DC
  Administered 2018-10-18: 07:00:00 via INTRAVENOUS

## 2018-10-18 MED ORDER — PROPOFOL 10 MG/ML IV BOLUS
INTRAVENOUS | Status: AC
  Filled 2018-10-18: qty 60

## 2018-10-18 MED ORDER — LIDOCAINE 2% (20 MG/ML) 5 ML SYRINGE
INTRAMUSCULAR | Status: DC | PRN
  Administered 2018-10-18: 100 mg via INTRAVENOUS

## 2018-10-18 NOTE — Transfer of Care (Signed)
Immediate Anesthesia Transfer of Care Note  Patient: Jesse Wheeler  Procedure(s) Performed: UPPER ENDOSCOPIC ULTRASOUND (EUS) RADIAL (N/A )  Patient Location: PACU and Endoscopy Unit  Anesthesia Type:MAC  Level of Consciousness: awake, alert  and oriented  Airway & Oxygen Therapy: Patient Spontanous Breathing and Patient connected to nasal cannula oxygen  Post-op Assessment: Report given to RN and Post -op Vital signs reviewed and stable  Post vital signs: Reviewed and stable  Last Vitals:  Vitals Value Taken Time  BP    Temp    Pulse    Resp    SpO2      Last Pain:  Vitals:   10/18/18 0645  TempSrc: Oral  PainSc: 0-No pain         Complications: No apparent anesthesia complications

## 2018-10-18 NOTE — Discharge Instructions (Signed)
YOU HAD AN ENDOSCOPIC PROCEDURE TODAY: Refer to the procedure report and other information in the discharge instructions given to you for any specific questions about what was found during the examination. If this information does not answer your questions, please call [place] office at [phone] to clarify.   YOU SHOULD EXPECT: Some feelings of bloating in the abdomen. Passage of more gas than usual. Walking can help get rid of the air that was put into your GI tract during the procedure and reduce the bloating. If you had a lower endoscopy (such as a colonoscopy or flexible sigmoidoscopy) you may notice spotting of blood in your stool or on the toilet paper. Some abdominal soreness may be present for a day or two, also.  DIET: Your first meal following the procedure should be a light meal and then it is ok to progress to your normal diet. A half-sandwich or bowl of soup is an example of a good first meal. Heavy or fried foods are harder to digest and may make you feel nauseous or bloated. Drink plenty of fluids but you should avoid alcoholic beverages for 24 hours. If you had a esophageal dilation, please see attached instructions for diet.    ACTIVITY: Your care partner should take you home directly after the procedure. You should plan to take it easy, moving slowly for the rest of the day. You can resume normal activity the day after the procedure however YOU SHOULD NOT DRIVE, use power tools, machinery or perform tasks that involve climbing or major physical exertion for 24 hours (because of the sedation medicines used during the test).   SYMPTOMS TO REPORT IMMEDIATELY: A gastroenterologist can be reached at any hour. Please call [phone]  for any of the following symptoms:   Following upper endoscopy (EGD, EUS, ERCP, esophageal dilation) Vomiting of blood or coffee ground material  New, significant abdominal pain  New, significant chest pain or pain under the shoulder blades  Painful or  persistently difficult swallowing  New shortness of breath  Black, tarry-looking or red, bloody stools  FOLLOW UP:  If any biopsies were taken you will be contacted by phone or by letter within the next 1-3 weeks. Call [phone]  if you have not heard about the biopsies in 3 weeks.  Please also call with any specific questions about appointments or follow up tests.  

## 2018-10-18 NOTE — Interval H&P Note (Signed)
History and Physical Interval Note:  10/18/2018 7:07 AM  Jesse Wheeler  has presented today for surgery, with the diagnosis of gasstric cancer staging.  The various methods of treatment have been discussed with the patient and family. After consideration of risks, benefits and other options for treatment, the patient has consented to  Procedure(s): UPPER ENDOSCOPIC ULTRASOUND (EUS) RADIAL (N/A) as a surgical intervention.  The patient's history has been reviewed, patient examined, no change in status, stable for surgery.  I have reviewed the patient's chart and labs.  Questions were answered to the patient's satisfaction.     Milus Banister

## 2018-10-18 NOTE — Op Note (Signed)
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                            Carlean Purl, no obvious metastatic disease by CT Providers:                Milus Banister, MD, Cleda Daub, RN, Cherylynn Ridges, Technician, Stephanie [place] CRNA Referring MD:             Dr. Barry Dienes Medicines:                Monitored Anesthesia Care Complications:            No immediate complications. Estimated blood loss:                            None. Estimated Blood Loss:     Estimated blood loss: none. Procedure:                Pre-Anesthesia Assessment:                           - Prior to the procedure, a History and Physical                            was performed, and patient medications and                            allergies were reviewed. The patient's tolerance of                            previous anesthesia was also reviewed. The risks                            and benefits of the procedure and the sedation                            options and risks were discussed with the patient.                            All questions were answered, and informed consent                            was obtained. Prior Anticoagulants: The patient has                            taken Xarelto (rivaroxaban), last dose was 2 days  prior to procedure. ASA Grade Assessment: III - A                            patient with severe systemic disease. After                            reviewing the risks and benefits, the patient was                            deemed in satisfactory condition to undergo the                            procedure.                           After obtaining informed  consent, the endoscope was                            passed under direct vision. Throughout the                            procedure, the patient's blood pressure, pulse, and                            oxygen saturations were monitored continuously. The                            [id number] ([id number]) Olympus Radial EUS was                            introduced through the mouth, and advanced to the                            second part of duodenum. The upper EUS was                            accomplished without difficulty. The patient                            tolerated the procedure well. Scope In: Scope Out: Findings:      ENDOSCOPIC FINDING: :      Malignant appearing mass in the distal stomach. This is somewhat subtle       but it is ulcerated, friable. The mass lays 2-3cm proximal to the       pylorus and it is not circumferential.      The UGI tract was otherwise normal.      ENDOSONOGRAPHIC FINDING: :      1. The mass above correlated with a hypoechoic, heterogeneous lesion       that measured 3.7cm across and was 15mm in thickness. The lesion passed       into and through the muscularis propria (UT3) as evident by obvious       pseudopodia, irregular outer margins.      2. No perigastric adenopathy (uN0).      3. Limited views of the pancreas, liver, spleen,  portal and splenic       vessels were all normal. Impression:               - 3.7cm wide by 59mm thick uT3N0                            non-circumferential, distal gastric adenocarcinoma                            (Stage IIb) Moderate Sedation:      Not Applicable - Patient had care per Anesthesia. Recommendation:           - Discharge patient to home (ambulatory).                           - I will communicate these results to Drs. Laurene Footman and [place]. Procedure Code(s):        --- Professional ---                           [id number], Esophagogastroduodenoscopy, flexible,                             transoral; with endoscopic ultrasound examination                            limited to the esophagus, stomach or duodenum, and                            adjacent structures Diagnosis Code(s):        --- Professional ---                           K31.89, Other diseases of stomach and duodenum CPT copyright 2019 American Medical Association. All rights reserved. The codes documented in this report are preliminary and upon coder review may  be revised to meet current compliance requirements. Milus Banister, MD 10/18/2018 8:10:56 AM This report has been signed electronically. Number of Addenda: 0

## 2018-10-18 NOTE — Anesthesia Preprocedure Evaluation (Signed)
Anesthesia Evaluation  Patient identified by MRN, date of birth, ID band Patient awake    Reviewed: Allergy & Precautions, NPO status , Patient's Chart, lab work & pertinent test results  History of Anesthesia Complications Negative for: history of anesthetic complications  Airway Mallampati: I  TM Distance: >3 FB Neck ROM: Full    Dental  (+) Teeth Intact   Pulmonary sleep apnea , former smoker,    Pulmonary exam normal        Cardiovascular hypertension, Pt. on home beta blockers and Pt. on medications + CAD, + Past MI and + Cardiac Stents  Normal cardiovascular exam     Neuro/Psych CVA negative psych ROS   GI/Hepatic Neg liver ROS, GERD  ,  Endo/Other  diabetes, Insulin DependentMorbid obesity  Renal/GU Renal InsufficiencyRenal disease  negative genitourinary   Musculoskeletal negative musculoskeletal ROS (+)   Abdominal   Peds  Hematology negative hematology ROS (+)   Anesthesia Other Findings   Reproductive/Obstetrics                            Anesthesia Physical Anesthesia Plan  ASA: III  Anesthesia Plan: MAC   Post-op Pain Management:    Induction:   PONV Risk Score and Plan: 1 and Propofol infusion and Treatment may vary due to age or medical condition  Airway Management Planned: Natural Airway and Nasal Cannula  Additional Equipment: None  Intra-op Plan:   Post-operative Plan:   Informed Consent: I have reviewed the patients History and Physical, chart, labs and discussed the procedure including the risks, benefits and alternatives for the proposed anesthesia with the patient or authorized representative who has indicated his/her understanding and acceptance.       Plan Discussed with:   Anesthesia Plan Comments:         Anesthesia Quick Evaluation

## 2018-10-18 NOTE — Anesthesia Postprocedure Evaluation (Signed)
Anesthesia Post Note  Patient: MILEN LENGACHER  Procedure(s) Performed: UPPER ENDOSCOPIC ULTRASOUND (EUS) RADIAL (N/A )     Patient location during evaluation: PACU Anesthesia Type: MAC Level of consciousness: awake and alert Pain management: pain level controlled Vital Signs Assessment: post-procedure vital signs reviewed and stable Respiratory status: spontaneous breathing, nonlabored ventilation and respiratory function stable Cardiovascular status: blood pressure returned to baseline and stable Postop Assessment: no apparent nausea or vomiting Anesthetic complications: no    Last Vitals:  Vitals:   10/18/18 0804 10/18/18 0810  BP: 131/64 134/71  Pulse: 87 84  Resp: 17 20  Temp: 36.9 C   SpO2: 94% 95%    Last Pain:  Vitals:   10/18/18 0804  TempSrc: Oral  PainSc: 0-No pain                 Lidia Collum

## 2018-10-19 ENCOUNTER — Encounter (HOSPITAL_COMMUNITY): Payer: Self-pay | Admitting: Gastroenterology

## 2018-10-30 ENCOUNTER — Other Ambulatory Visit: Payer: Self-pay | Admitting: General Surgery

## 2018-11-06 ENCOUNTER — Telehealth: Payer: Self-pay | Admitting: Oncology

## 2018-11-06 NOTE — Telephone Encounter (Signed)
Josem Kaufmann information was already documented in amb referral on 10/12/18 for his EUS.

## 2018-11-06 NOTE — Telephone Encounter (Signed)
Called pt per 6/23 sch message - no answer and no vmail. Pt called to cancel . msg sent to RN

## 2018-11-06 NOTE — Telephone Encounter (Signed)
[facility] called with [place] #[id number] exp 04/02/2019.

## 2018-11-07 ENCOUNTER — Encounter: Payer: Self-pay | Admitting: *Deleted

## 2018-11-07 NOTE — Progress Notes (Signed)
Surgery scheduled for 11/22/18 per Dr. Barry Dienes. Dr. Benay Spice wants to see him 2-3 weeks after surgery. Scheduling message sent.

## 2018-11-08 ENCOUNTER — Ambulatory Visit: Payer: Medicare Other | Admitting: Oncology

## 2018-11-10 ENCOUNTER — Other Ambulatory Visit: Payer: Self-pay | Admitting: General Surgery

## 2018-11-13 NOTE — Pre-Procedure Instructions (Signed)
Jesse Wheeler  11/13/2018      [id number] [address], [place] [address] [place] [address] Phone: [phone] Fax: [phone]    Your procedure is scheduled on November 22, 2018.  Report to [facility] Entrance "A" at 1200 PM.  Call this number if you have problems the morning of surgery:  [phone]   Call [phone] if you have any questions prior to your surgery date Monday-Friday 8am-4pm    Remember:  Do not eat or drink after midnight.  You may drink clear liquids until 1100 AM .  Clear liquids allowed are:         Water, Juice (non-citric and without pulp), Clear Tea, Black Coffee only and Gatorade  Please complete your G2 (Gatorade) that was provided to you 3 hours prior to you surgery start time (1100 AM).  Please, if able, drink it in one setting. DO NOT SIP.    Take these medicines the morning of surgery with A SIP OF WATER  Doxazosin (Cardura) Metoprolol Succinate (Toprol XL) Gabapentin (neurontin) Omeprazole (prilosec) Sena-docusate (senokot) Nitrostat-if needed for chest pain only  Follow your surgeon's instructions on when to hold/resume Xarelto and aspirin.  If no instructions were given call the office to determine how they would like to you take Xarelto and aspirin  7 days prior to surgery STOP taking any Aleve, Naproxen, Ibuprofen, Motrin, Advil, Goody's, BC's, all herbal medications, fish oil, and all vitamins  WHAT DO I DO ABOUT MY DIABETES MEDICATION?   Marland Kitchen Do not take oral diabetes medicines (pills) the morning of surgery.  . THE NIGHT BEFORE SURGERY, take you normal dose of Novolog insulin with dinner and  30 units of lantus insulin.(1/2 of your normal dose)       . THE MORNING OF SURGERY, take 32 units of lantus insulin (1/2 of your normal dose) and NO novolog insulin UNLESS your CBG is greater than 220 mg/dL, you may take  of your sliding scale  (correction) dose of insulin.  . The day of surgery, do not take other diabetes injectables, including Byetta (exenatide), Bydureon (exenatide ER), Victoza (liraglutide), or Trulicity (dulaglutide).  . If your CBG is greater than 220 mg/dL, you may take  of your sliding scale (correction) dose of insulin.  Reviewed and Endorsed by [facility] Patient Education Committee, August 2015  How to Manage Your Diabetes Before and After Surgery  Why is it important to control my blood sugar before and after surgery? . Improving blood sugar levels before and after surgery helps healing and can limit problems. . A way of improving blood sugar control is eating a healthy diet by: o  Eating less sugar and carbohydrates o  Increasing activity/exercise o  Talking with your doctor about reaching your blood sugar goals . High blood sugars (greater than 180 mg/dL) can raise your risk of infections and slow your recovery, so you will need to focus on controlling your diabetes during the weeks before surgery. . Make sure that the doctor who takes care of your diabetes knows about your planned surgery including the date and location.  How do I manage my blood sugar before surgery? . Check your blood sugar at least 4 times a day, starting 2 days before surgery, to make sure that the level is not too high or low. o Check your blood sugar the morning of your surgery when you wake up and  every 2 hours until you get to the Short Stay unit. . If your blood sugar is less than 70 mg/dL, you will need to treat for low blood sugar: o Do not take insulin. o Treat a low blood sugar (less than 70 mg/dL) with  cup of clear juice (cranberry or apple), 4 glucose tablets, OR glucose gel. Recheck blood sugar in 15 minutes after treatment (to make sure it is greater than 70 mg/dL). If your blood sugar is not greater than 70 mg/dL on recheck, call [phone] o  for further instructions. . Report your blood sugar to the short  stay nurse when you get to Short Stay.  . If you are admitted to the hospital after surgery: o Your blood sugar will be checked by the staff and you will probably be given insulin after surgery (instead of oral diabetes medicines) to make sure you have good blood sugar levels. o The goal for blood sugar control after surgery is 80-180 mg/dL.   [place]- Preparing For Surgery  Before surgery, you can play an important role. Because skin is not sterile, your skin needs to be as free of germs as possible. You can reduce the number of germs on your skin by washing with CHG (chlorahexidine gluconate) Soap before surgery.  CHG is an antiseptic cleaner which kills germs and bonds with the skin to continue killing germs even after washing.    Oral Hygiene is also important to reduce your risk of infection.  Remember - BRUSH YOUR TEETH THE MORNING OF SURGERY WITH YOUR REGULAR TOOTHPASTE  Please do not use if you have an allergy to CHG or antibacterial soaps. If your skin becomes reddened/irritated stop using the CHG.  Do not shave (including legs and underarms) for at least 48 hours prior to first CHG shower. It is OK to shave your face.  Please follow these instructions carefully.   1. Shower the NIGHT BEFORE SURGERY and the MORNING OF SURGERY with CHG.   2. If you chose to wash your hair, wash your hair first as usual with your normal shampoo.  3. After you shampoo, rinse your hair and body thoroughly to remove the shampoo.  4. Use CHG as you would any other liquid soap. You can apply CHG directly to the skin and wash gently with a scrungie or a clean washcloth.   5. Apply the CHG Soap to your body ONLY FROM THE NECK DOWN.  Do not use on open wounds or open sores. Avoid contact with your eyes, ears, mouth and genitals (private parts). Wash Face and genitals (private parts)  with your normal soap.  6. Wash thoroughly, paying special attention to the area where your surgery will be  performed.  7. Thoroughly rinse your body with warm water from the neck down.  8. DO NOT shower/wash with your normal soap after using and rinsing off the CHG Soap.  9. Pat yourself dry with a CLEAN TOWEL.  10. Wear CLEAN PAJAMAS to bed the night before surgery, wear comfortable clothes the morning of surgery  11. Place CLEAN SHEETS on your bed the night of your first shower and DO NOT SLEEP WITH PETS.    Day of Surgery:  Do not apply any deodorants/lotions.  Please wear clean clothes to the hospital/surgery center.   Remember to brush your teeth WITH YOUR REGULAR TOOTHPASTE.    Do not wear jewelry  Do not wear lotions, powders, or colognes, or deodorant.  Men may shave face and neck.  Do not bring valuables to the hospital.  [facility] is not responsible for any belongings or valuables.  Contacts, dentures or bridgework may not be worn into surgery.  Leave your suitcase in the car.  After surgery it may be brought to your room.  For patients admitted to the hospital, discharge time will be determined by your treatment team. IF you are a smoker, DO NOT Smoke 24 hours prior to surgery   IF you wear a CPAP at night please bring your mask, tubing, and machine the morning of surgery    Remember that you must have someone to transport you home after your surgery, and remain with you for 24 hours if you are discharged the same day.   Please read over the following fact sheets that you were given.

## 2018-11-14 ENCOUNTER — Encounter (HOSPITAL_COMMUNITY)
Admission: RE | Admit: 2018-11-14 | Discharge: 2018-11-14 | Disposition: A | Discharge status: Home / Self Care | Payer: No Typology Code available for payment source | Source: Ambulatory Visit | Attending: General Surgery | Admitting: General Surgery

## 2018-11-14 ENCOUNTER — Other Ambulatory Visit: Payer: Self-pay

## 2018-11-14 ENCOUNTER — Encounter (HOSPITAL_COMMUNITY): Payer: Self-pay

## 2018-11-14 DIAGNOSIS — I252 Old myocardial infarction: Secondary | ICD-10-CM | POA: Diagnosis not present

## 2018-11-14 DIAGNOSIS — I2510 Atherosclerotic heart disease of native coronary artery without angina pectoris: Secondary | ICD-10-CM | POA: Diagnosis not present

## 2018-11-14 DIAGNOSIS — Z79899 Other long term (current) drug therapy: Secondary | ICD-10-CM | POA: Insufficient documentation

## 2018-11-14 DIAGNOSIS — I129 Hypertensive chronic kidney disease with stage 1 through stage 4 chronic kidney disease, or unspecified chronic kidney disease: Secondary | ICD-10-CM | POA: Insufficient documentation

## 2018-11-14 DIAGNOSIS — N183 Chronic kidney disease, stage 3 (moderate): Secondary | ICD-10-CM | POA: Diagnosis not present

## 2018-11-14 DIAGNOSIS — I255 Ischemic cardiomyopathy: Secondary | ICD-10-CM | POA: Insufficient documentation

## 2018-11-14 DIAGNOSIS — G4733 Obstructive sleep apnea (adult) (pediatric): Secondary | ICD-10-CM | POA: Insufficient documentation

## 2018-11-14 DIAGNOSIS — Z794 Long term (current) use of insulin: Secondary | ICD-10-CM | POA: Insufficient documentation

## 2018-11-14 DIAGNOSIS — E1122 Type 2 diabetes mellitus with diabetic chronic kidney disease: Secondary | ICD-10-CM | POA: Insufficient documentation

## 2018-11-14 DIAGNOSIS — Z01818 Encounter for other preprocedural examination: Secondary | ICD-10-CM | POA: Diagnosis present

## 2018-11-14 DIAGNOSIS — Z955 Presence of coronary angioplasty implant and graft: Secondary | ICD-10-CM | POA: Insufficient documentation

## 2018-11-14 DIAGNOSIS — E785 Hyperlipidemia, unspecified: Secondary | ICD-10-CM | POA: Insufficient documentation

## 2018-11-14 DIAGNOSIS — Z7982 Long term (current) use of aspirin: Secondary | ICD-10-CM | POA: Insufficient documentation

## 2018-11-14 DIAGNOSIS — Z87891 Personal history of nicotine dependence: Secondary | ICD-10-CM | POA: Diagnosis not present

## 2018-11-14 DIAGNOSIS — Z8673 Personal history of transient ischemic attack (TIA), and cerebral infarction without residual deficits: Secondary | ICD-10-CM | POA: Diagnosis not present

## 2018-11-14 DIAGNOSIS — I480 Paroxysmal atrial fibrillation: Secondary | ICD-10-CM | POA: Insufficient documentation

## 2018-11-14 HISTORY — DX: Malignant (primary) neoplasm, unspecified: C80.1

## 2018-11-14 HISTORY — DX: Chronic kidney disease, unspecified: N18.9

## 2018-11-14 HISTORY — DX: Unspecified hearing loss, unspecified ear: H91.90

## 2018-11-14 LAB — HEMOGLOBIN A1C
Hgb A1c MFr Bld: 7.7 % — ABNORMAL HIGH (ref 4.8–5.6)
Mean Plasma Glucose: 174.29 mg/dL

## 2018-11-14 LAB — COMPREHENSIVE METABOLIC PANEL
ALT: 43 U/L (ref 0–44)
AST: 58 U/L — ABNORMAL HIGH (ref 15–41)
Albumin: 3.5 g/dL (ref 3.5–5.0)
Alkaline Phosphatase: 55 U/L (ref 38–126)
Anion gap: 11 (ref 5–15)
BUN: 28 mg/dL — ABNORMAL HIGH (ref 8–23)
CO2: 21 mmol/L — ABNORMAL LOW (ref 22–32)
Calcium: 9.3 mg/dL (ref 8.9–10.3)
Chloride: 106 mmol/L (ref 98–111)
Creatinine, Ser: 2.17 mg/dL — ABNORMAL HIGH (ref 0.61–1.24)
GFR calc Af Amer: 33 mL/min — ABNORMAL LOW (ref >60)
GFR calc non Af Amer: 29 mL/min — ABNORMAL LOW (ref >60)
Glucose, Bld: 162 mg/dL — ABNORMAL HIGH (ref 70–99)
Potassium: 4.7 mmol/L (ref 3.5–5.1)
Sodium: 138 mmol/L (ref 135–145)
Total Bilirubin: 0.7 mg/dL (ref 0.3–1.2)
Total Protein: 6 g/dL — ABNORMAL LOW (ref 6.5–8.1)

## 2018-11-14 LAB — URINALYSIS, COMPLETE (UACMP) WITH MICROSCOPIC
Bacteria, UA: NONE SEEN
Bilirubin Urine: NEGATIVE
Glucose, UA: NEGATIVE mg/dL
Hgb urine dipstick: NEGATIVE
Ketones, ur: NEGATIVE mg/dL
Leukocytes,Ua: NEGATIVE
Nitrite: NEGATIVE
Protein, ur: 30 mg/dL — AB
RBC / HPF: >50 RBC/hpf — ABNORMAL HIGH (ref 0–5)
Specific Gravity, Urine: 1.025 (ref 1.005–1.030)
pH: 5 (ref 5.0–8.0)

## 2018-11-14 LAB — CBC
HCT: 39.8 % (ref 39.0–52.0)
Hemoglobin: 12.7 g/dL — ABNORMAL LOW (ref 13.0–17.0)
MCH: 30.8 pg (ref 26.0–34.0)
MCHC: 31.9 g/dL (ref 30.0–36.0)
MCV: 96.4 fL (ref 80.0–100.0)
Platelets: 172 10*3/uL (ref 150–400)
RBC: 4.13 MIL/uL — ABNORMAL LOW (ref 4.22–5.81)
RDW: 14.5 % (ref 11.5–15.5)
WBC: 4.5 10*3/uL (ref 4.0–10.5)
nRBC: 0 % (ref 0.0–0.2)

## 2018-11-14 LAB — TYPE AND SCREEN
ABO/RH(D): AB POS
Antibody Screen: NEGATIVE

## 2018-11-14 LAB — GLUCOSE, CAPILLARY: Glucose-Capillary: 191 mg/dL — ABNORMAL HIGH (ref 70–99)

## 2018-11-14 LAB — ABO/RH: ABO/RH(D): AB POS

## 2018-11-14 NOTE — Progress Notes (Addendum)
Patient informed of the [place] that is currently in effect.  Patient verbalized understanding.  Patient denies shortness of breath, fever, cough and chest pain at PAT appointment  PCP - Dr Coletta Memos Cardiologist - Dr Alonna [facility]   Chest x-ray - 09/01/17 EKG - 04/26/2018 Stress Test - 05/2014 ECHO -12/2014 Cardiac Cath - 12/2000  Sleep Study - yes CPAP -  Uses cpap nightly  Fasting Blood Sugar - 190s Checks Blood Sugar __10___ times a day - has Free Style Libre System  Blood Thinner Instructions: Xarelto Last Dose will be on 11/18/18  ERAS:clears til 11 am DOS.  G2 Lower Sugar Gatorade given.  Aspirin Instructions: Patient to contact MD to get aspirin instructions for surgical procedure.  Patient verbalized agreement - has appt with MD on Tuesday 11/20/18.  Anesthesia review: yes  STOP now taking any Aspirin (unless otherwise instructed by your surgeon), Aleve, Naproxen, Ibuprofen, Motrin, Advil, Goody's, BC's, all herbal medications, fish oil, and all vitamins.  Coronavirus Screening Have you or your wife experienced the following symptoms:  Cough yes/no: No Fever (>100.50F)  yes/no: No Runny nose yes/no: No Sore throat yes/no: No Difficulty breathing/shortness of breath  yes/no: No  Have you or your wife traveled in the last 14 days and where? yes/no: No  Patient will check with MD at his appt next week to see if he needs to do a bowel prep.

## 2018-11-14 NOTE — Progress Notes (Signed)
Spoke with April at [facility] office to inform her of abn labs- UA, CMP and A1C at PAT visit.  Patient has appt with Dr Barry Dienes on Tuesday, 11/20/18 and labs will be reviewed.

## 2018-11-15 NOTE — Progress Notes (Signed)
Anesthesia Chart Review:  Case: [id number] Date/Time: 11/27/18 1300   Procedures:      LAPAROSCOPY DIAGNOSTIC (N/A )     DISTAL GASTRECTOMY (N/A )   Anesthesia type: General   Pre-op diagnosis: GASTRIC CANCER   Location: [facility] OR ROOM 11 / [place] [facility]   Surgeon: [name], MD      DISCUSSION: 78 yo male former smoker. Pertinent hx includes CAD s/p acute MI with stent to RCA 2002, paroxysmal afib, ischemic cardiomyopathy, HLD, CVA, CKD III/IV, OSA on CPAP, IDDMII, HTN.  Follows with neurology for hx of TIA/CVA 2016. Recently cleared to undergo EGD "As he has been stable from a neurological standpoint, he is cleared to discontinue Xarelto 2 days prior to procedure and restart immediately after once cleared by GI provider.  There is a small but acceptable periprocedural risk of recurrent stroke while off anticoagulation."  Follows with cardiology at the [facility] for hx of CAD with stent to RCA in 2002, ischemic cardiomyopathy, and afib. He was cleared for surgery 09/25/18, "Mr. Seidel has reportedly been diagnosed with gastric cancer and his surgeons are seeking preprocedure CV risk assessment.  I have not been advised as to the exact procedure he will be undergoing.  As above, Mr. Keithly was last seen in this clinic 03/04/2018 and has no CV symptoms at 4-7 METS of activity.  His last stress test was 03/04/2017 and showed known inferior infarct without significant reversible ischemia.  He has had no recent cardiac hospitalizations and within the last 6 months has not had ACS, malignant arrhythmia, CHF or severe valvular heart disease... He has high risk for perioperative CV complications based on inherent risk factors as listed above.  However he maintains good functional status with no CV symptoms and has not had no acute CV exacerbations or hospitalizations in the last 6 months.  Based on AHA/ACC guidelines, he may continue to surgery without further testing.  He should be continued on metoprolol in the perioperative  period as tolerated from a hemodynamic standpoint, including on the day of surgery.  Xarelto may be held without bridging for 48 to 72 hours prior to surgery."  Anticipate he can proceed as planned barring acute status change.    VS: BP 118/60   Pulse (!) 110   Temp 36.8 C (Oral)   Resp 18   Ht 5' 9.5" (1.765 m)   Wt (!) 139.3 kg   SpO2 93%   BMI 44.70 kg/m   PROVIDERS: Bernerd Limbo, MD is PCP  Collene Schlichter, MD is Cardiologist  LABS: Consistent with pt's CKD. Baseline creatinine appears to be ~2.0. (all labs ordered are listed, but only abnormal results are displayed)  Labs Reviewed  GLUCOSE, CAPILLARY - Abnormal; Notable for the following components:      Result Value   Glucose-Capillary 191 (*)    All other components within normal limits  URINALYSIS, COMPLETE (UACMP) WITH MICROSCOPIC - Abnormal; Notable for the following components:   Color, Urine AMBER (*)    APPearance CLOUDY (*)    Protein, ur 30 (*)    RBC / HPF >50 (*)    All other components within normal limits  HEMOGLOBIN A1C - Abnormal; Notable for the following components:   Hgb A1c MFr Bld 7.7 (*)    All other components within normal limits  CBC - Abnormal; Notable for the following components:   RBC 4.13 (*)    Hemoglobin 12.7 (*)    All other components within normal limits  COMPREHENSIVE  METABOLIC PANEL - Abnormal; Notable for the following components:   CO2 21 (*)    Glucose, Bld 162 (*)    BUN 28 (*)    Creatinine, Ser 2.17 (*)    Total Protein 6.0 (*)    AST 58 (*)    GFR calc non Af Amer 29 (*)    GFR calc Af Amer 33 (*)    All other components within normal limits  TYPE AND SCREEN  ABO/RH     IMAGES: CT C/A/P 09/21/18: IMPRESSION: 1. Primary gastric mass is not appreciated by noncontrast CT. No evidence of lymphadenopathy or other definite evidence of metastatic disease in the chest, abdomen, or pelvis.  2. Multiple small bilateral pulmonary nodules, some of which are clearly  benign and calcified. The largest noncalcified nodule is in the superior segment right lower lobe, measuring 7 mm (series 4, image 65). Attention on follow-up.  3. There is mild pulmonary fibrosis in a pattern with apically to basal gradient, featuring irregular peripheral interstitial pulmonary opacity and mild traction bronchiectasis. No clear evidence of honeycombing. Fibrotic findings in the bilateral lung bases are worsened as included on CT of the abdomen and pelvis dated 11/10/2011. 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  4.  Other chronic and incidental findings as detailed above.  EKG: 04/26/18: Sinus rhythm with 1st degree A-V block. Rate 82. Right bundle branch block. Left posterior fascicular block. Cannot rule out Inferior infarct , age undetermined. No significant change since last tracing   CV: TTE 03/22/2017 (copy on patient chart): The left ventricle is normal in size with mild concentric left ventricular hypertrophy.  Left ventricular systolic function is mildly reduced; LVEF equals 40-45%.  There is a moderate size zone of moderate to severe hypokinesis in the basal to mid inferior and inferolateral walls extending to the anterolateral wall consistent with prior infarct.  The trans-mitral spectral Doppler flow pattern is suggestive of impaired LV relaxation. The right ventricle is mildly dilated with normal systolic function. No significant valvular abnormalities.  Exercise nuclear stress test 03/02/2017 (copy on patient chart): The patient walked 3 minutes on full Bruce protocol achieving 4.6 METS.  The patient denied any chest pain.  Maximal heart rate achieved 150 bpm which was 103%. Stress results: The patient experienced no chest pain during stress. Baseline EKG: Normal sinus rhythm with first-degree AV block, right bundle branch block, T wave abnormality in inferolateral leads. Stress EKG: At peak stress, the EKG  revealed no diagnostic ST segment displacement.  The stress EKG is negative for ischemia. Findings consistent with remote infarct involving the entire inferior wall, apex in the mid and basal segments of the inferior lateral wall.  Questionable mild reversible defect involving the apical lateral segment consistent with peri-infarct inducible ischemia. Severe global hypokinesis and dyskinesis within an enlarged left ventricle is seen in the 3 cardiac reconstruction. Calculated ejection fraction is 25%.  Past Medical History:  Diagnosis Date  . Actinic keratoses 08/09/2013   Overview:  IMPRESSION: Liquid nitrogen x2   . Acute inferior myocardial infarction ([place])    stenting in 2002 to the RCA  . Anemia   . Bowen's disease   . Cancer [facility])    recent  dx stomach cancer per pt 11/14/18  . Cataract    bilateral - surgery to remove  . Chronic kidney disease   . Coronary artery disease   . Diabetes mellitus    type II  . Dupuytren's contracture of  left hand   . Hearing loss    bilateral - has hearing aids  . Heart murmur   . History of colonic polyps 04/14/2014  . Hypercholesterolemia   . Hyperlipidemia   . Hypertension    LCX  . Obesity   . Onychomycosis   . ONYCHOMYCOSIS 09/07/2006   Qualifier: Diagnosis of  By: Cletus Gash MD, [place]    . Panic disorder   . Peyronie's disease   . Prostatitis, chronic   . Right bundle branch block   . S/P coronary angioplasty   . Sleep apnea    uses cpap nightly  . Stroke [facility])    takes xarelto  . Tinnitus     Past Surgical History:  Procedure Laterality Date  . CATARACT EXTRACTION W/ INTRAOCULAR LENS  IMPLANT, BILATERAL    . CHOLECYSTECTOMY    . COLONOSCOPY  03/09/2004   Normal  . CORONARY ANGIOPLASTY  01/02/01   Acute inferior MI -- Stent placement in the proximal RCA (three separate stents totalling approximately 55 mm of stent with residual thrombus in the posterior descending artery) --  Relatively mild inferior basilar hypokinesia --Severe  stenosis in a secondary marginal vessel of the left circumflex -- Minimal coronary atherosclerosis otherwise of the coronary system   . ESOPHAGOGASTRODUODENOSCOPY    . ESOPHAGOGASTRODUODENOSCOPY (EGD) WITH PROPOFOL N/A 10/18/2018   Procedure: ESOPHAGOGASTRODUODENOSCOPY (EGD) WITH PROPOFOL;  Surgeon: Milus Banister, MD;  Location: [facility] ENDOSCOPY;  Service: Endoscopy;  Laterality: N/A;  . EUS N/A 10/18/2018   Procedure: UPPER ENDOSCOPIC ULTRASOUND (EUS) RADIAL;  Surgeon: Milus Banister, MD;  Location: [facility] ENDOSCOPY;  Service: Endoscopy;  Laterality: N/A;  . EYE SURGERY Right 04/2018   eye hole repaired  . FASCIECTOMY Right 05/01/2018   Procedure: FASCIECTOMY RIGHT RING FINGER;  Surgeon: Daryll Brod, MD;  Location: [place];  Service: Orthopedics;  Laterality: Right;  . Nuclear stress test  Oct 2011    Dense inferior infarct; mild ischemia with dilated LV. EF was 39%. Study was felt to be unchanged and he has been managed medically  . TRIGGER FINGER RELEASE Right 05/01/2018   Procedure: RELEASE TRIGGER FINGER RIGHT RING FINGER;  Surgeon: Daryll Brod, MD;  Location: [place];  Service: Orthopedics;  Laterality: Right;    MEDICATIONS: . aspirin EC 81 MG tablet  . Blood Glucose Monitoring Suppl (FIFTY50 GLUCOSE METER 2.0) W/DEVICE KIT  . doxazosin (CARDURA) 8 MG tablet  . ERGOCALCIFEROL PO  . ezetimibe (ZETIA) 10 MG tablet  . ferrous sulfate 325 (65 FE) MG tablet  . furosemide (LASIX) 20 MG tablet  . gabapentin (NEURONTIN) 300 MG capsule  . insulin aspart (NOVOLOG) 100 UNIT/ML injection  . insulin glargine (LANTUS) 100 UNIT/ML injection  . isosorbide mononitrate (IMDUR) 30 MG 24 hr tablet  . lisinopril (PRINIVIL,ZESTRIL) 20 MG tablet  . lisinopril (ZESTRIL) 40 MG tablet  . metoprolol succinate (TOPROL-XL) 25 MG 24 hr tablet  . nitroGLYCERIN (NITROSTAT) 0.4 MG SL tablet  . omeprazole (PRILOSEC) 40 MG capsule  . ONETOUCH VERIO test strip  . Rivaroxaban  (XARELTO) 15 MG TABS tablet  . Semaglutide (OZEMPIC) 1 MG/DOSE SOPN  . senna-docusate (SENOKOT-S) 8.6-50 MG tablet  . simvastatin (ZOCOR) 40 MG tablet  . temazepam (RESTORIL) 15 MG capsule   No current facility-administered medications for this encounter.     Wynonia Musty [facility] Short Stay Center/Anesthesiology Phone [phone] 11/15/2018 2:26 PM

## 2018-11-15 NOTE — Anesthesia Preprocedure Evaluation (Deleted)
Anesthesia Evaluation    Airway        Dental   Pulmonary former smoker,           Cardiovascular hypertension,      Neuro/Psych    GI/Hepatic   Endo/Other  diabetes  Renal/GU      Musculoskeletal   Abdominal   Peds  Hematology   Anesthesia Other Findings   Reproductive/Obstetrics                             Anesthesia Physical Anesthesia Plan  ASA:   Anesthesia Plan:    Post-op Pain Management:    Induction:   PONV Risk Score and Plan:   Airway Management Planned:   Additional Equipment:   Intra-op Plan:   Post-operative Plan:   Informed Consent:   Plan Discussed with:   Anesthesia Plan Comments: (See PAT note by James Burns, PA-C )        Anesthesia Quick Evaluation  

## 2018-11-19 ENCOUNTER — Other Ambulatory Visit (HOSPITAL_COMMUNITY): Payer: Medicare Other

## 2018-11-23 ENCOUNTER — Other Ambulatory Visit (HOSPITAL_COMMUNITY)
Admission: RE | Admit: 2018-11-23 | Discharge: 2018-11-23 | Disposition: A | Discharge status: Home / Self Care | Payer: Medicare Other | Source: Ambulatory Visit | Attending: General Surgery | Admitting: General Surgery

## 2018-11-23 DIAGNOSIS — Z01812 Encounter for preprocedural laboratory examination: Secondary | ICD-10-CM | POA: Insufficient documentation

## 2018-11-23 DIAGNOSIS — Z1159 Encounter for screening for other viral diseases: Secondary | ICD-10-CM | POA: Insufficient documentation

## 2018-11-23 LAB — SARS CORONAVIRUS 2 (TAT 6-24 HRS): SARS Coronavirus 2: NEGATIVE

## 2018-11-26 MED ORDER — DEXTROSE 5 % IV SOLN
3.0000 g | INTRAVENOUS | Status: AC
  Administered 2018-11-27: 12:00:00 3 g via INTRAVENOUS
  Filled 2018-11-26: qty 3
  Filled 2018-11-26 (×2): qty 3000

## 2018-11-26 NOTE — H&P (Signed)
Jesse Wheeler Documented: 11/20/2018 2:32 PM Location: [facility] Patient #: [id number] DOB: 10/30/1940 Married / Language: Jesse Wheeler / Race: White Male   History of Present Illness Jesse Klein MD; 11/20/2018 3:48 PM) The patient is a 78 year old male who presents for a follow-up for Gastric cancer. Patient is a 78 year old male referred by Dr. 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 He presented with anemia. He had colonoscopy that was essentially negative. It sounds as though he also had a capsule endoscopy that was unsuccessful. And that having an ulcer seen on Jesse. Biopsy was positive for adenocarcinoma. The patient does have significant medical history including history of an MI with cardiac stenting, pulmonary fibrosis, chronic renal insufficiency, sleep apnea, history of stroke, anticoagulation with Xarelto, and more. Patient denies any recent history of weight loss, early satiety, nausea, vomiting, bloody stools. The patient denies personal or family history of cancer.  He had Jesse wtih Jesse Wheeler. He had a uT3N0 tumor in the antrum. He also had pulmonary and cardiac appointments. Both said "high risk," but optimized and minimally symptomatic. I brought him back to review. Pulmonary recommended consideration of extubation to cpap or bipap. They also recommended aggressive pulmonary toilet. Cards recommended continuing metoprolol.   Jesse Wheeler 10/18/2018 Jesse FINDING: Findings: Malignant appearing mass in the distal stomach. This is somewhat subtle but it is ulcerated, friable. The mass lays 2-3cm proximal to the pylorus and it is not circumferential. The UGI tract was otherwise normal. ENDOSONOGRAPHIC FINDING: 1. The mass above correlated with a hypoechoic, heterogeneous lesion that measured 3.7cm across and was 9mm in thickness. The lesion passed into and through the muscularis propria (UT3) as evident by obvious pseudopodia,  irregular outer margins. 2. No perigastric adenopathy (uN0). 3. Limited views of the pancreas, liver, spleen, portal and splenic vessels were all normal. - 3.7cm wide by 71mm thick uT3N0 non-circumferential, distal gastric adenocarcinoma (Stage IIb)  Jesse Wheeler 09/13/2018 Non-bleeding gastric ulcer with no stigmata of bleeding. Biopsied. Irregular mucosa surrounding. Prior area of superficial gastric ulcer with intestinal metaplasia. This is likely cause of anemia and iron deficiency - The examination was otherwise normal.  pathology Jesse 09/13/2018 Surgical [P], gastric antrum - ADENOCARCINOMA.  CT chest/abd/pelvis 09/21/2018 IMPRESSION: 1. Primary gastric mass is not appreciated by noncontrast CT. No evidence of lymphadenopathy or other definite evidence of metastatic disease in the chest, abdomen, or pelvis.  2. Multiple small bilateral pulmonary nodules, some of which are clearly benign and calcified. The largest noncalcified nodule is in the superior segment right lower lobe, measuring 7 mm (series 4, image 65). Attention on follow-up.  3. There is mild pulmonary fibrosis in a pattern with apically to basal gradient, featuring irregular peripheral interstitial pulmonary opacity and mild traction bronchiectasis. No clear evidence of honeycombing. Fibrotic findings in the bilateral lung bases are worsened as included on CT of the abdomen and pelvis dated 11/10/2011. 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  4. Other chronic and incidental findings as detailed above.  Labs 09/18/2018 CMET^ cr 1.96, normal CBC   Allergies (Jesse Wheeler, CMA; 11/20/2018 2:33 PM) No Known Drug Allergies  [10/03/2018]:  Medication History (Jesse Wheeler, CMA; 11/20/2018 2:33 PM) Omeprazole (40MG  Capsule DR, Oral) Active. Aspirin (81MG  Tablet, Oral) Active. Blood Glucose Monitoring Suppl Active. Ezetimibe (10MG  Tablet, Oral) Active. Furosemide  (20MG  Tablet, Oral) Active. Gabapentin (300MG  Tablet, Oral) Active. Insulin Glargine (100UNIT/ML Solution, Subcutaneous) Active. Lisinopril (20MG  Tablet, Oral)  Active. Metoprolol Succinate (25MG  CP24 Sprinkle, Oral) Active. Nitroglycerin (0.4MG  Tab Sublingual, Sublingual) Active. Xarelto (15MG  Tablet, Oral) Active. Temazepam (15MG  Capsule, Oral) Active. Medications Reconciled    Review of Systems Jesse Klein MD; 11/20/2018 3:47 PM) All other systems negative  Vitals (Jesse Wheeler CMA; 11/20/2018 2:33 PM) 11/20/2018 2:33 PM Weight: 312.25 lb Height: 69in Body Surface Area: 2.5 m Body Mass Index: 46.11 kg/m  Temp.: 22F (Oral)  Pulse: 115 (Regular)  BP: 182/90(Sitting, Left Arm, Standard)       Physical Exam Jesse Klein MD; 11/20/2018 3:48 PM) General Mental Status-Alert. General Appearance-Consistent with stated age. Hydration-Well hydrated. Voice-Normal.  Head and Neck Head-normocephalic, atraumatic with no lesions or palpable masses.  Eye Sclera/Conjunctiva - Bilateral-No scleral icterus.  Chest and Lung Exam Chest and lung exam reveals -quiet, even and easy respiratory effort with no use of accessory muscles. Inspection Chest Wall - Normal. Back - normal.  Breast - Did not examine.  Cardiovascular Cardiovascular examination reveals -normal pedal pulses bilaterally. Note: regular rate and rhythm  Abdomen Inspection-Inspection Normal. Palpation/Percussion Palpation and Percussion of the abdomen reveal - Soft, Non Tender, No Rebound tenderness, No Rigidity (guarding) and No hepatosplenomegaly.  Peripheral Vascular Upper Extremity Inspection - Bilateral - Normal - No Clubbing, No Cyanosis, No Edema, Pulses Intact. Lower Extremity Palpation - Edema - Bilateral - No edema - Bilateral.  Neurologic Neurologic evaluation reveals -alert and oriented x 3 with no impairment of recent or remote memory. Mental  Status-Normal.  Musculoskeletal Global Assessment -Note: no gross deformities.  Normal Exam - Left-Upper Extremity Strength Normal and Lower Extremity Strength Normal. Normal Exam - Right-Upper Extremity Strength Normal and Lower Extremity Strength Normal.  Lymphatic Head & Neck  General Head & Neck Lymphatics: Bilateral - Description - Normal. Axillary  General Axillary Region: Bilateral - Description - Normal. Tenderness - Non Tender.    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  I reviewed the risks of surgery and the wording of the cards and pulmonary notes.  He does still wish to have surgery. Also, the mass was a little bigger on Jesse than on Jesse. I discussed the things we would do to minimize risk.  He is also supposed to start anticoagulation back as soon as we can. Will hold on epidural and use OnQ to assist with pain control.  Current Plans Instructed to keep follow-up appointment as scheduled   Signed by Jesse Klein, MD (11/20/2018 3:55 PM)

## 2018-11-27 ENCOUNTER — Inpatient Hospital Stay (HOSPITAL_COMMUNITY): Payer: No Typology Code available for payment source

## 2018-11-27 ENCOUNTER — Encounter (HOSPITAL_COMMUNITY): Payer: Self-pay

## 2018-11-27 ENCOUNTER — Inpatient Hospital Stay (HOSPITAL_COMMUNITY): Payer: No Typology Code available for payment source | Admitting: Certified Registered Nurse Anesthetist

## 2018-11-27 ENCOUNTER — Inpatient Hospital Stay (HOSPITAL_COMMUNITY)
Admission: RE | Admit: 2018-11-27 | Discharge: 2018-12-03 | DRG: 327 | Disposition: A | Discharge status: Home / Self Care | Payer: No Typology Code available for payment source | Attending: General Surgery | Admitting: General Surgery

## 2018-11-27 ENCOUNTER — Encounter (HOSPITAL_COMMUNITY)
Admission: RE | Disposition: A | Discharge status: Home / Self Care | Payer: Self-pay | Source: Home / Self Care | Attending: General Surgery

## 2018-11-27 ENCOUNTER — Inpatient Hospital Stay (HOSPITAL_COMMUNITY): Payer: No Typology Code available for payment source | Admitting: Physician Assistant

## 2018-11-27 DIAGNOSIS — I10 Essential (primary) hypertension: Secondary | ICD-10-CM

## 2018-11-27 DIAGNOSIS — N183 Chronic kidney disease, stage 3 (moderate): Secondary | ICD-10-CM | POA: Diagnosis present

## 2018-11-27 DIAGNOSIS — C779 Secondary and unspecified malignant neoplasm of lymph node, unspecified: Secondary | ICD-10-CM | POA: Diagnosis present

## 2018-11-27 DIAGNOSIS — Z6841 Body Mass Index (BMI) 40.0 and over, adult: Secondary | ICD-10-CM | POA: Diagnosis not present

## 2018-11-27 DIAGNOSIS — I255 Ischemic cardiomyopathy: Secondary | ICD-10-CM | POA: Diagnosis present

## 2018-11-27 DIAGNOSIS — Z86007 Personal history of in-situ neoplasm of skin: Secondary | ICD-10-CM

## 2018-11-27 DIAGNOSIS — I130 Hypertensive heart and chronic kidney disease with heart failure and stage 1 through stage 4 chronic kidney disease, or unspecified chronic kidney disease: Secondary | ICD-10-CM | POA: Diagnosis present

## 2018-11-27 DIAGNOSIS — Z8673 Personal history of transient ischemic attack (TIA), and cerebral infarction without residual deficits: Secondary | ICD-10-CM

## 2018-11-27 DIAGNOSIS — N179 Acute kidney failure, unspecified: Secondary | ICD-10-CM | POA: Diagnosis not present

## 2018-11-27 DIAGNOSIS — R0902 Hypoxemia: Secondary | ICD-10-CM | POA: Diagnosis not present

## 2018-11-27 DIAGNOSIS — R918 Other nonspecific abnormal finding of lung field: Secondary | ICD-10-CM | POA: Diagnosis present

## 2018-11-27 DIAGNOSIS — G4733 Obstructive sleep apnea (adult) (pediatric): Secondary | ICD-10-CM | POA: Diagnosis present

## 2018-11-27 DIAGNOSIS — D500 Iron deficiency anemia secondary to blood loss (chronic): Secondary | ICD-10-CM | POA: Diagnosis present

## 2018-11-27 DIAGNOSIS — J479 Bronchiectasis, uncomplicated: Secondary | ICD-10-CM | POA: Diagnosis present

## 2018-11-27 DIAGNOSIS — E1122 Type 2 diabetes mellitus with diabetic chronic kidney disease: Secondary | ICD-10-CM | POA: Diagnosis present

## 2018-11-27 DIAGNOSIS — I2510 Atherosclerotic heart disease of native coronary artery without angina pectoris: Secondary | ICD-10-CM | POA: Diagnosis not present

## 2018-11-27 DIAGNOSIS — Z7982 Long term (current) use of aspirin: Secondary | ICD-10-CM

## 2018-11-27 DIAGNOSIS — E1165 Type 2 diabetes mellitus with hyperglycemia: Secondary | ICD-10-CM | POA: Diagnosis not present

## 2018-11-27 DIAGNOSIS — Z8711 Personal history of peptic ulcer disease: Secondary | ICD-10-CM

## 2018-11-27 DIAGNOSIS — C163 Malignant neoplasm of pyloric antrum: Secondary | ICD-10-CM | POA: Diagnosis present

## 2018-11-27 DIAGNOSIS — Z86010 Personal history of colonic polyps: Secondary | ICD-10-CM

## 2018-11-27 DIAGNOSIS — I440 Atrioventricular block, first degree: Secondary | ICD-10-CM | POA: Diagnosis present

## 2018-11-27 DIAGNOSIS — Z7901 Long term (current) use of anticoagulants: Secondary | ICD-10-CM

## 2018-11-27 DIAGNOSIS — H9193 Unspecified hearing loss, bilateral: Secondary | ICD-10-CM | POA: Diagnosis present

## 2018-11-27 DIAGNOSIS — Z794 Long term (current) use of insulin: Secondary | ICD-10-CM

## 2018-11-27 DIAGNOSIS — Z79899 Other long term (current) drug therapy: Secondary | ICD-10-CM

## 2018-11-27 DIAGNOSIS — E785 Hyperlipidemia, unspecified: Secondary | ICD-10-CM | POA: Diagnosis present

## 2018-11-27 DIAGNOSIS — E875 Hyperkalemia: Secondary | ICD-10-CM | POA: Diagnosis not present

## 2018-11-27 DIAGNOSIS — I4510 Unspecified right bundle-branch block: Secondary | ICD-10-CM | POA: Diagnosis present

## 2018-11-27 DIAGNOSIS — I480 Paroxysmal atrial fibrillation: Secondary | ICD-10-CM | POA: Diagnosis present

## 2018-11-27 DIAGNOSIS — I5032 Chronic diastolic (congestive) heart failure: Secondary | ICD-10-CM | POA: Diagnosis present

## 2018-11-27 DIAGNOSIS — I472 Ventricular tachycardia: Secondary | ICD-10-CM | POA: Diagnosis not present

## 2018-11-27 DIAGNOSIS — I252 Old myocardial infarction: Secondary | ICD-10-CM

## 2018-11-27 DIAGNOSIS — Z4659 Encounter for fitting and adjustment of other gastrointestinal appliance and device: Secondary | ICD-10-CM

## 2018-11-27 DIAGNOSIS — F05 Delirium due to known physiological condition: Secondary | ICD-10-CM | POA: Diagnosis not present

## 2018-11-27 DIAGNOSIS — R062 Wheezing: Secondary | ICD-10-CM

## 2018-11-27 DIAGNOSIS — J84112 Idiopathic pulmonary fibrosis: Secondary | ICD-10-CM | POA: Diagnosis present

## 2018-11-27 DIAGNOSIS — N411 Chronic prostatitis: Secondary | ICD-10-CM | POA: Diagnosis present

## 2018-11-27 DIAGNOSIS — Z955 Presence of coronary angioplasty implant and graft: Secondary | ICD-10-CM

## 2018-11-27 DIAGNOSIS — G4709 Other insomnia: Secondary | ICD-10-CM | POA: Diagnosis present

## 2018-11-27 DIAGNOSIS — Z87891 Personal history of nicotine dependence: Secondary | ICD-10-CM

## 2018-11-27 HISTORY — PX: GASTRECTOMY: SHX58

## 2018-11-27 HISTORY — PX: LAPAROSCOPY: SHX197

## 2018-11-27 LAB — BASIC METABOLIC PANEL
Anion gap: 14 (ref 5–15)
BUN: 23 mg/dL (ref 8–23)
CO2: 17 mmol/L — ABNORMAL LOW (ref 22–32)
Calcium: 8.8 mg/dL — ABNORMAL LOW (ref 8.9–10.3)
Chloride: 102 mmol/L (ref 98–111)
Creatinine, Ser: 2.25 mg/dL — ABNORMAL HIGH (ref 0.61–1.24)
GFR calc Af Amer: 32 mL/min — ABNORMAL LOW (ref >60)
GFR calc non Af Amer: 27 mL/min — ABNORMAL LOW (ref >60)
Glucose, Bld: 308 mg/dL — ABNORMAL HIGH (ref 70–99)
Potassium: 6.3 mmol/L (ref 3.5–5.1)
Sodium: 133 mmol/L — ABNORMAL LOW (ref 135–145)

## 2018-11-27 LAB — CBC
HCT: 42.9 % (ref 39.0–52.0)
Hemoglobin: 13.9 g/dL (ref 13.0–17.0)
MCH: 31.2 pg (ref 26.0–34.0)
MCHC: 32.4 g/dL (ref 30.0–36.0)
MCV: 96.2 fL (ref 80.0–100.0)
Platelets: 169 10*3/uL (ref 150–400)
RBC: 4.46 MIL/uL (ref 4.22–5.81)
RDW: 14.3 % (ref 11.5–15.5)
WBC: 13.8 10*3/uL — ABNORMAL HIGH (ref 4.0–10.5)
nRBC: 0 % (ref 0.0–0.2)

## 2018-11-27 LAB — PROTIME-INR
INR: 1.1 (ref 0.8–1.2)
Prothrombin Time: 14.2 seconds (ref 11.4–15.2)

## 2018-11-27 LAB — GLUCOSE, CAPILLARY
Glucose-Capillary: 213 mg/dL — ABNORMAL HIGH (ref 70–99)
Glucose-Capillary: 197 mg/dL — ABNORMAL HIGH (ref 70–99)
Glucose-Capillary: 262 mg/dL — ABNORMAL HIGH (ref 70–99)
Glucose-Capillary: 293 mg/dL — ABNORMAL HIGH (ref 70–99)
Glucose-Capillary: 343 mg/dL — ABNORMAL HIGH (ref 70–99)
Glucose-Capillary: 374 mg/dL — ABNORMAL HIGH (ref 70–99)

## 2018-11-27 LAB — POTASSIUM: Potassium: 6.9 mmol/L (ref 3.5–5.1)

## 2018-11-27 SURGERY — LAPAROSCOPY, DIAGNOSTIC
Anesthesia: General | Site: Abdomen

## 2018-11-27 MED ORDER — ROCURONIUM BROMIDE 10 MG/ML (PF) SYRINGE
PREFILLED_SYRINGE | INTRAVENOUS | Status: AC
  Filled 2018-11-27: qty 10

## 2018-11-27 MED ORDER — ACETAMINOPHEN 10 MG/ML IV SOLN
1000.0000 mg | Freq: Four times a day (QID) | INTRAVENOUS | Status: AC
  Administered 2018-11-27 – 2018-11-28 (×4): 1000 mg via INTRAVENOUS
  Filled 2018-11-27 (×4): qty 100

## 2018-11-27 MED ORDER — DIPHENHYDRAMINE HCL 50 MG/ML IJ SOLN: 12.5000 mg | Freq: Four times a day (QID) | INTRAMUSCULAR | Status: DC | PRN

## 2018-11-27 MED ORDER — FENTANYL CITRATE (PF) 250 MCG/5ML IJ SOLN
INTRAMUSCULAR | Status: AC
  Filled 2018-11-27: qty 5

## 2018-11-27 MED ORDER — LACTATED RINGERS IV SOLN
INTRAVENOUS | Status: DC | PRN
  Administered 2018-11-27: 12:00:00 via INTRAVENOUS

## 2018-11-27 MED ORDER — HYDROMORPHONE 1 MG/ML IV SOLN
INTRAVENOUS | Status: DC
  Administered 2018-11-27: 30 mg via INTRAVENOUS
  Administered 2018-11-27: 1.5 mg via INTRAVENOUS
  Administered 2018-11-28: 1.4 mg via INTRAVENOUS
  Administered 2018-11-28: 1.2 mg via INTRAVENOUS
  Administered 2018-11-28: 0.8 mg via INTRAVENOUS
  Administered 2018-11-28: 0.4 mg via INTRAVENOUS
  Administered 2018-11-28 (×2): 0.6 mg via INTRAVENOUS
  Administered 2018-11-28 – 2018-11-29 (×2): 0 mg via INTRAVENOUS
  Administered 2018-11-29: 2 mg via INTRAVENOUS
  Administered 2018-11-29: 0.2 mg via INTRAVENOUS
  Administered 2018-11-29: 1 mg via INTRAVENOUS
  Administered 2018-11-29: 0.2 mg via INTRAVENOUS
  Administered 2018-11-30 (×2): 0 mg via INTRAVENOUS
  Filled 2018-11-27: qty 30

## 2018-11-27 MED ORDER — BUPIVACAINE 0.25 % ON-Q PUMP DUAL CATH 300 ML
300.0000 mL | INJECTION | Status: DC
  Filled 2018-11-27: qty 300

## 2018-11-27 MED ORDER — SCOPOLAMINE 1 MG/3DAYS TD PT72
MEDICATED_PATCH | TRANSDERMAL | Status: AC
  Administered 2018-11-27: 10:00:00 1.5 mg via TRANSDERMAL
  Filled 2018-11-27: qty 1

## 2018-11-27 MED ORDER — PANTOPRAZOLE SODIUM 40 MG IV SOLR
40.0000 mg | Freq: Every day | INTRAVENOUS | Status: DC
  Administered 2018-11-27 – 2018-12-01 (×5): 40 mg via INTRAVENOUS
  Filled 2018-11-27 (×6): qty 40

## 2018-11-27 MED ORDER — STERILE WATER FOR IRRIGATION IR SOLN
Status: DC | PRN
  Administered 2018-11-27: 2000 mL

## 2018-11-27 MED ORDER — PANTOPRAZOLE SODIUM 40 MG IV SOLR: 40.0000 mg | INTRAVENOUS | Status: DC

## 2018-11-27 MED ORDER — FENTANYL CITRATE (PF) 100 MCG/2ML IJ SOLN
INTRAMUSCULAR | Status: AC
  Filled 2018-11-27: qty 2

## 2018-11-27 MED ORDER — DIPHENHYDRAMINE HCL 12.5 MG/5ML PO ELIX: 12.5000 mg | ORAL_SOLUTION | Freq: Four times a day (QID) | ORAL | Status: DC | PRN

## 2018-11-27 MED ORDER — DEXAMETHASONE SODIUM PHOSPHATE 10 MG/ML IJ SOLN
INTRAMUSCULAR | Status: DC | PRN
  Administered 2018-11-27: 5 mg via INTRAVENOUS

## 2018-11-27 MED ORDER — ONDANSETRON HCL 4 MG/2ML IJ SOLN: 4.0000 mg | Freq: Once | INTRAMUSCULAR | Status: DC | PRN

## 2018-11-27 MED ORDER — LABETALOL HCL 5 MG/ML IV SOLN
INTRAVENOUS | Status: DC | PRN
  Administered 2018-11-27 (×4): 5 mg via INTRAVENOUS

## 2018-11-27 MED ORDER — METOPROLOL TARTRATE 5 MG/5ML IV SOLN
7.5000 mg | Freq: Four times a day (QID) | INTRAVENOUS | Status: DC
  Administered 2018-11-27 – 2018-12-03 (×23): 7.5 mg via INTRAVENOUS
  Filled 2018-11-27 (×23): qty 10

## 2018-11-27 MED ORDER — ONDANSETRON HCL 4 MG/2ML IJ SOLN: 4.0000 mg | Freq: Four times a day (QID) | INTRAMUSCULAR | Status: DC | PRN

## 2018-11-27 MED ORDER — DEXTROSE 50 % IV SOLN
1.0000 | Freq: Once | INTRAVENOUS | Status: AC
  Administered 2018-11-27: 50 mL via INTRAVENOUS
  Filled 2018-11-27: qty 50

## 2018-11-27 MED ORDER — ALBUTEROL SULFATE HFA 108 (90 BASE) MCG/ACT IN AERS
INHALATION_SPRAY | RESPIRATORY_TRACT | Status: DC | PRN
  Administered 2018-11-27: 1 via RESPIRATORY_TRACT

## 2018-11-27 MED ORDER — ONDANSETRON HCL 4 MG/2ML IJ SOLN
INTRAMUSCULAR | Status: AC
  Filled 2018-11-27: qty 2

## 2018-11-27 MED ORDER — IPRATROPIUM-ALBUTEROL 0.5-2.5 (3) MG/3ML IN SOLN: 3.0000 mL | Freq: Four times a day (QID) | RESPIRATORY_TRACT | Status: DC

## 2018-11-27 MED ORDER — DEXAMETHASONE SODIUM PHOSPHATE 10 MG/ML IJ SOLN
INTRAMUSCULAR | Status: AC
  Filled 2018-11-27: qty 1

## 2018-11-27 MED ORDER — LIDOCAINE 2% (20 MG/ML) 5 ML SYRINGE
INTRAMUSCULAR | Status: DC | PRN
  Administered 2018-11-27: 100 mg via INTRAVENOUS

## 2018-11-27 MED ORDER — MIDAZOLAM HCL 2 MG/2ML IJ SOLN
INTRAMUSCULAR | Status: AC
  Filled 2018-11-27: qty 2

## 2018-11-27 MED ORDER — ONDANSETRON 4 MG PO TBDP
4.0000 mg | ORAL_TABLET | Freq: Four times a day (QID) | ORAL | Status: DC | PRN
  Administered 2018-12-03: 4 mg via ORAL
  Filled 2018-11-27: qty 1

## 2018-11-27 MED ORDER — METHOCARBAMOL 1000 MG/10ML IJ SOLN
500.0000 mg | Freq: Three times a day (TID) | INTRAVENOUS | Status: DC | PRN
  Administered 2018-11-27: 16:00:00 500 mg via INTRAVENOUS
  Filled 2018-11-27 (×2): qty 5

## 2018-11-27 MED ORDER — CHLORHEXIDINE GLUCONATE CLOTH 2 % EX PADS: 6.0000 | MEDICATED_PAD | Freq: Once | CUTANEOUS | Status: DC

## 2018-11-27 MED ORDER — ENOXAPARIN SODIUM 40 MG/0.4ML ~~LOC~~ SOLN
40.0000 mg | SUBCUTANEOUS | Status: DC
  Administered 2018-11-28 – 2018-11-29 (×2): 40 mg via SUBCUTANEOUS
  Filled 2018-11-27 (×2): qty 0.4

## 2018-11-27 MED ORDER — ACETAMINOPHEN 325 MG PO TABS: 650.0000 mg | ORAL_TABLET | Freq: Four times a day (QID) | ORAL | Status: DC | PRN

## 2018-11-27 MED ORDER — LACTATED RINGERS IV SOLN
INTRAVENOUS | Status: DC | PRN
  Administered 2018-11-27 (×2): via INTRAVENOUS

## 2018-11-27 MED ORDER — INSULIN GLARGINE 100 UNIT/ML ~~LOC~~ SOLN
40.0000 [IU] | Freq: Two times a day (BID) | SUBCUTANEOUS | Status: DC
  Administered 2018-11-27: 22:00:00 40 [IU] via SUBCUTANEOUS
  Filled 2018-11-27 (×4): qty 0.4

## 2018-11-27 MED ORDER — ROCURONIUM BROMIDE 10 MG/ML (PF) SYRINGE
PREFILLED_SYRINGE | INTRAVENOUS | Status: DC | PRN
  Administered 2018-11-27: 100 mg via INTRAVENOUS

## 2018-11-27 MED ORDER — SODIUM CHLORIDE 0.9 % IV SOLN
INTRAVENOUS | Status: DC
  Administered 2018-11-27 – 2018-11-30 (×4): via INTRAVENOUS

## 2018-11-27 MED ORDER — BUPIVACAINE-EPINEPHRINE (PF) 0.25% -1:200000 IJ SOLN
INTRAMUSCULAR | Status: AC
  Filled 2018-11-27: qty 30

## 2018-11-27 MED ORDER — ACETAMINOPHEN 650 MG RE SUPP: 650.0000 mg | Freq: Four times a day (QID) | RECTAL | Status: DC | PRN

## 2018-11-27 MED ORDER — SCOPOLAMINE 1 MG/3DAYS TD PT72
1.0000 | MEDICATED_PATCH | TRANSDERMAL | Status: DC
  Administered 2018-11-27: 10:00:00 1.5 mg via TRANSDERMAL

## 2018-11-27 MED ORDER — INSULIN ASPART 100 UNIT/ML IV SOLN
10.0000 [IU] | Freq: Once | INTRAVENOUS | Status: AC
  Administered 2018-11-27: 23:00:00 10 [IU] via INTRAVENOUS

## 2018-11-27 MED ORDER — PROPOFOL 10 MG/ML IV BOLUS
INTRAVENOUS | Status: DC | PRN
  Administered 2018-11-27: 120 mg via INTRAVENOUS

## 2018-11-27 MED ORDER — OXYCODONE HCL 5 MG PO TABS: 5.0000 mg | ORAL_TABLET | Freq: Once | ORAL | Status: DC | PRN

## 2018-11-27 MED ORDER — HYDRALAZINE HCL 20 MG/ML IJ SOLN: 10.0000 mg | INTRAMUSCULAR | Status: DC | PRN

## 2018-11-27 MED ORDER — HYDROMORPHONE HCL 1 MG/ML IJ SOLN
INTRAMUSCULAR | Status: AC
  Filled 2018-11-27: qty 1

## 2018-11-27 MED ORDER — FENTANYL CITRATE (PF) 100 MCG/2ML IJ SOLN
INTRAMUSCULAR | Status: DC | PRN
  Administered 2018-11-27: 100 ug via INTRAVENOUS
  Administered 2018-11-27 (×2): 50 ug via INTRAVENOUS
  Administered 2018-11-27: 150 ug via INTRAVENOUS
  Administered 2018-11-27 (×3): 50 ug via INTRAVENOUS

## 2018-11-27 MED ORDER — SODIUM CHLORIDE 0.9% FLUSH: 9.0000 mL | INTRAVENOUS | Status: DC | PRN

## 2018-11-27 MED ORDER — HYDRALAZINE HCL 20 MG/ML IJ SOLN
INTRAMUSCULAR | Status: DC | PRN
  Administered 2018-11-27 (×2): 2 mg via INTRAVENOUS

## 2018-11-27 MED ORDER — SUCCINYLCHOLINE CHLORIDE 200 MG/10ML IV SOSY
PREFILLED_SYRINGE | INTRAVENOUS | Status: AC
  Filled 2018-11-27: qty 10

## 2018-11-27 MED ORDER — OXYCODONE HCL 5 MG/5ML PO SOLN: 5.0000 mg | Freq: Once | ORAL | Status: DC | PRN

## 2018-11-27 MED ORDER — HYDROMORPHONE HCL 1 MG/ML IJ SOLN
0.5000 mg | INTRAMUSCULAR | Status: AC | PRN
  Administered 2018-11-27 (×4): 0.5 mg via INTRAVENOUS

## 2018-11-27 MED ORDER — NALOXONE HCL 0.4 MG/ML IJ SOLN: 0.4000 mg | INTRAMUSCULAR | Status: DC | PRN

## 2018-11-27 MED ORDER — SODIUM CHLORIDE 0.9 % IV SOLN
INTRAVENOUS | Status: DC
  Administered 2018-11-27: 10:00:00 via INTRAVENOUS

## 2018-11-27 MED ORDER — SUGAMMADEX SODIUM 200 MG/2ML IV SOLN
INTRAVENOUS | Status: DC | PRN
  Administered 2018-11-27: 400 mg via INTRAVENOUS

## 2018-11-27 MED ORDER — NITROGLYCERIN 0.4 MG SL SUBL: 0.4000 mg | SUBLINGUAL_TABLET | SUBLINGUAL | Status: DC | PRN

## 2018-11-27 MED ORDER — ONDANSETRON HCL 4 MG/2ML IJ SOLN
4.0000 mg | Freq: Four times a day (QID) | INTRAMUSCULAR | Status: DC | PRN
  Administered 2018-11-27 – 2018-11-28 (×3): 4 mg via INTRAVENOUS
  Filled 2018-11-27 (×4): qty 2

## 2018-11-27 MED ORDER — FUROSEMIDE 10 MG/ML IJ SOLN
20.0000 mg | Freq: Every day | INTRAMUSCULAR | Status: DC
  Administered 2018-11-27: 18:00:00 20 mg via INTRAVENOUS
  Filled 2018-11-27 (×2): qty 2

## 2018-11-27 MED ORDER — METOPROLOL TARTRATE 5 MG/5ML IV SOLN: 2.5000 mg | Freq: Four times a day (QID) | INTRAVENOUS | Status: DC

## 2018-11-27 MED ORDER — POTASSIUM CHLORIDE IN NACL 20-0.9 MEQ/L-% IV SOLN
INTRAVENOUS | Status: DC
  Administered 2018-11-27: 19:00:00 via INTRAVENOUS
  Filled 2018-11-27: qty 1000

## 2018-11-27 MED ORDER — 0.9 % SODIUM CHLORIDE (POUR BTL) OPTIME
TOPICAL | Status: DC | PRN
  Administered 2018-11-27: 13:00:00 2000 mL

## 2018-11-27 MED ORDER — BUDESONIDE 0.5 MG/2ML IN SUSP
0.5000 mg | Freq: Two times a day (BID) | RESPIRATORY_TRACT | Status: DC
  Administered 2018-11-27 – 2018-12-03 (×12): 0.5 mg via RESPIRATORY_TRACT
  Filled 2018-11-27 (×12): qty 2

## 2018-11-27 MED ORDER — SODIUM CHLORIDE 0.9 % IV SOLN
INTRAVENOUS | Status: DC | PRN
  Administered 2018-11-27: 20 ug/min via INTRAVENOUS

## 2018-11-27 MED ORDER — CEFAZOLIN SODIUM-DEXTROSE 2-4 GM/100ML-% IV SOLN
2.0000 g | Freq: Three times a day (TID) | INTRAVENOUS | Status: AC
  Administered 2018-11-27: 20:00:00 2 g via INTRAVENOUS
  Filled 2018-11-27: qty 100

## 2018-11-27 MED ORDER — IPRATROPIUM-ALBUTEROL 0.5-2.5 (3) MG/3ML IN SOLN
3.0000 mL | Freq: Four times a day (QID) | RESPIRATORY_TRACT | Status: DC
  Administered 2018-11-27: 20:00:00 3 mL via RESPIRATORY_TRACT
  Filled 2018-11-27: qty 3

## 2018-11-27 MED ORDER — ONDANSETRON HCL 4 MG/2ML IJ SOLN
INTRAMUSCULAR | Status: DC | PRN
  Administered 2018-11-27: 4 mg via INTRAVENOUS

## 2018-11-27 MED ORDER — LIDOCAINE 2% (20 MG/ML) 5 ML SYRINGE
INTRAMUSCULAR | Status: AC
  Filled 2018-11-27: qty 5

## 2018-11-27 MED ORDER — DIPHENHYDRAMINE HCL 50 MG/ML IJ SOLN
12.5000 mg | Freq: Four times a day (QID) | INTRAMUSCULAR | Status: DC | PRN
  Administered 2018-11-28: 12.5 mg via INTRAVENOUS
  Filled 2018-11-27: qty 1

## 2018-11-27 MED ORDER — ALBUTEROL SULFATE (2.5 MG/3ML) 0.083% IN NEBU
INHALATION_SOLUTION | RESPIRATORY_TRACT | Status: AC
  Filled 2018-11-27: qty 3

## 2018-11-27 MED ORDER — MIDAZOLAM HCL 2 MG/2ML IJ SOLN
INTRAMUSCULAR | Status: DC | PRN
  Administered 2018-11-27: 2 mg via INTRAVENOUS

## 2018-11-27 MED ORDER — FENTANYL CITRATE (PF) 100 MCG/2ML IJ SOLN
25.0000 ug | INTRAMUSCULAR | Status: DC | PRN
  Administered 2018-11-27 (×2): 50 ug via INTRAVENOUS

## 2018-11-27 MED ORDER — INSULIN ASPART 100 UNIT/ML ~~LOC~~ SOLN
0.0000 [IU] | SUBCUTANEOUS | Status: DC
  Administered 2018-11-27 (×2): 11 [IU] via SUBCUTANEOUS
  Administered 2018-11-28 (×2): 4 [IU] via SUBCUTANEOUS
  Administered 2018-11-28 (×2): 3 [IU] via SUBCUTANEOUS
  Administered 2018-11-28: 20 [IU] via SUBCUTANEOUS
  Administered 2018-11-28: 11 [IU] via SUBCUTANEOUS
  Administered 2018-12-01: 13:00:00 3 [IU] via SUBCUTANEOUS
  Administered 2018-12-01 – 2018-12-03 (×9): 4 [IU] via SUBCUTANEOUS
  Administered 2018-12-03 (×2): 3 [IU] via SUBCUTANEOUS

## 2018-11-27 MED ORDER — HYDRALAZINE HCL 20 MG/ML IJ SOLN: 5.0000 mg | INTRAMUSCULAR | Status: DC | PRN

## 2018-11-27 MED ORDER — BUPIVACAINE 0.25 % ON-Q PUMP DUAL CATH 300 ML
INJECTION | Status: AC | PRN
  Administered 2018-11-27: 300 mL

## 2018-11-27 MED ORDER — LIDOCAINE HCL 1 % IJ SOLN
INTRAMUSCULAR | Status: DC | PRN
  Administered 2018-11-27: 40 mL

## 2018-11-27 MED ORDER — BUPIVACAINE ON-Q PAIN PUMP (FOR ORDER SET NO CHG)
INJECTION | Status: DC
  Filled 2018-11-27: qty 1

## 2018-11-27 MED ORDER — IPRATROPIUM-ALBUTEROL 0.5-2.5 (3) MG/3ML IN SOLN
3.0000 mL | Freq: Three times a day (TID) | RESPIRATORY_TRACT | Status: DC
  Administered 2018-11-28 – 2018-12-02 (×13): 3 mL via RESPIRATORY_TRACT
  Filled 2018-11-27 (×13): qty 3

## 2018-11-27 MED ORDER — LIDOCAINE HCL (PF) 1 % IJ SOLN
INTRAMUSCULAR | Status: AC
  Filled 2018-11-27: qty 30

## 2018-11-27 SURGICAL SUPPLY — 87 items
BIOPATCH RED 1 DISK 7.0 (GAUZE/BANDAGES/DRESSINGS) IMPLANT
BIOPATCH RED 1IN DISK 7.0MM (GAUZE/BANDAGES/DRESSINGS)
BLADE CLIPPER SURG (BLADE) IMPLANT
CANISTER SUCT 3000ML PPV (MISCELLANEOUS) ×3 IMPLANT
CATH KIT ON-Q SILVERSOAK 7.5 (CATHETERS) IMPLANT
CATH KIT ON-Q SILVERSOAK 7.5IN (CATHETERS) ×3 IMPLANT
CHLORAPREP W/TINT 26 (MISCELLANEOUS) ×3 IMPLANT
CLIP VESOCCLUDE LG 6/CT (CLIP) IMPLANT
CLIP VESOCCLUDE MED 24/CT (CLIP) IMPLANT
CLIP VESOLOCK LG 6/CT PURPLE (CLIP) IMPLANT
CLIP VESOLOCK MED 6/CT (CLIP) IMPLANT
CLIP VESOLOCK MED LG 6/CT (CLIP) IMPLANT
CLOSURE STERI-STRIP 1/2X4 (GAUZE/BANDAGES/DRESSINGS) ×1
CLSR STERI-STRIP ANTIMIC 1/2X4 (GAUZE/BANDAGES/DRESSINGS) ×1 IMPLANT
COVER SURGICAL LIGHT HANDLE (MISCELLANEOUS) ×3 IMPLANT
COVER WAND RF STERILE (DRAPES) ×3 IMPLANT
DECANTER SPIKE VIAL GLASS SM (MISCELLANEOUS) ×6 IMPLANT
DERMABOND ADVANCED (GAUZE/BANDAGES/DRESSINGS) ×2
DERMABOND ADVANCED .7 DNX12 (GAUZE/BANDAGES/DRESSINGS) ×1 IMPLANT
DRAIN CHANNEL 19F RND (DRAIN) IMPLANT
DRAIN PENROSE 18X1/2 LTX STRL (DRAIN) IMPLANT
DRAPE LAPAROSCOPIC ABDOMINAL (DRAPES) ×3 IMPLANT
DRAPE UTILITY XL STRL (DRAPES) IMPLANT
DRAPE WARM FLUID 44X44 (DRAPES) ×3 IMPLANT
DRSG COVADERM 4X10 (GAUZE/BANDAGES/DRESSINGS) IMPLANT
DRSG COVADERM 4X8 (GAUZE/BANDAGES/DRESSINGS) IMPLANT
DRSG OPSITE 6X11 MED (GAUZE/BANDAGES/DRESSINGS) ×2 IMPLANT
DRSG TEGADERM 2-3/8X2-3/4 SM (GAUZE/BANDAGES/DRESSINGS) ×2 IMPLANT
DRSG TEGADERM 4X4.75 (GAUZE/BANDAGES/DRESSINGS) ×4 IMPLANT
ELECT BLADE 6.5 EXT (BLADE) ×3 IMPLANT
ELECT REM PT RETURN 9FT ADLT (ELECTROSURGICAL) ×3
ELECTRODE REM PT RTRN 9FT ADLT (ELECTROSURGICAL) ×1 IMPLANT
EVACUATOR SILICONE 100CC (DRAIN) IMPLANT
GAUZE SPONGE 4X4 12PLY STRL (GAUZE/BANDAGES/DRESSINGS) ×3 IMPLANT
GLOVE BIO SURGEON STRL SZ 6 (GLOVE) ×3 IMPLANT
GLOVE INDICATOR 6.5 STRL GRN (GLOVE) ×3 IMPLANT
GLOVE SURG ORTHO 8.0 STRL STRW (GLOVE) ×2 IMPLANT
GOWN STRL REUS W/ TWL LRG LVL3 (GOWN DISPOSABLE) ×2 IMPLANT
GOWN STRL REUS W/TWL 2XL LVL3 (GOWN DISPOSABLE) ×6 IMPLANT
GOWN STRL REUS W/TWL LRG LVL3 (GOWN DISPOSABLE) ×4
HANDLE SUCTION POOLE (INSTRUMENTS) IMPLANT
KIT BASIN OR (CUSTOM PROCEDURE TRAY) ×3 IMPLANT
KIT TUBE JEJUNAL 16FR (CATHETERS) IMPLANT
KIT TURNOVER KIT B (KITS) ×3 IMPLANT
L-HOOK LAP DISP 36CM (ELECTROSURGICAL) ×3
LHOOK LAP DISP 36CM (ELECTROSURGICAL) ×1 IMPLANT
NS IRRIG 1000ML POUR BTL (IV SOLUTION) ×6 IMPLANT
PACK GENERAL/GYN (CUSTOM PROCEDURE TRAY) ×3 IMPLANT
PAD ARMBOARD 7.5X6 YLW CONV (MISCELLANEOUS) ×6 IMPLANT
PENCIL BUTTON HOLSTER BLD 10FT (ELECTRODE) ×3 IMPLANT
PENCIL SMOKE EVACUATOR (MISCELLANEOUS) ×3 IMPLANT
RELOAD PROXIMATE 75MM BLUE (ENDOMECHANICALS) ×6 IMPLANT
RELOAD STAPLE 75 3.8 BLU REG (ENDOMECHANICALS) IMPLANT
RELOAD STAPLER LINE PROX 60 GR (STAPLE) ×2 IMPLANT
SCISSORS LAP 5X35 DISP (ENDOMECHANICALS) IMPLANT
SET IRRIG TUBING LAPAROSCOPIC (IRRIGATION / IRRIGATOR) IMPLANT
SET TUBE SMOKE EVAC HIGH FLOW (TUBING) ×3 IMPLANT
SHEARS FOC LG CVD HARMONIC 17C (MISCELLANEOUS) ×2 IMPLANT
SLEEVE ENDOPATH XCEL 5M (ENDOMECHANICALS) ×3 IMPLANT
SLEEVE SUCTION CATH 165 (SLEEVE) ×4 IMPLANT
SPECIMEN JAR LARGE (MISCELLANEOUS) ×3 IMPLANT
SPONGE LAP 18X18 RF (DISPOSABLE) IMPLANT
STAPLE ECHEON FLEX 60 POW ENDO (STAPLE) IMPLANT
STAPLER PROXIMATE 75MM BLUE (STAPLE) ×2 IMPLANT
STAPLER RELOAD LINE PROX 60 GR (STAPLE) ×6
STAPLER RELOADABLE 60 GRN THCK (STAPLE) IMPLANT
STAPLER VISISTAT 35W (STAPLE) ×3 IMPLANT
SUCTION POOLE HANDLE (INSTRUMENTS) ×3
SUT ETHILON 2 0 FS 18 (SUTURE) IMPLANT
SUT MNCRL AB 4-0 PS2 18 (SUTURE) ×3 IMPLANT
SUT PDS AB 1 TP1 96 (SUTURE) ×6 IMPLANT
SUT PDS AB 3-0 SH 27 (SUTURE) IMPLANT
SUT SILK 2 0 SH CR/8 (SUTURE) ×3 IMPLANT
SUT SILK 2 0 TIES 10X30 (SUTURE) ×3 IMPLANT
SUT SILK 3 0 SH CR/8 (SUTURE) ×5 IMPLANT
SUT SILK 3 0 TIES 10X30 (SUTURE) ×3 IMPLANT
TOWEL GREEN STERILE (TOWEL DISPOSABLE) ×3 IMPLANT
TOWEL GREEN STERILE FF (TOWEL DISPOSABLE) ×3 IMPLANT
TRAY FOLEY CATH 16FRSI W/METER (SET/KITS/TRAYS/PACK) ×3 IMPLANT
TRAY LAPAROSCOPIC MC (CUSTOM PROCEDURE TRAY) ×3 IMPLANT
TROCAR XCEL BLUNT TIP 100MML (ENDOMECHANICALS) IMPLANT
TROCAR XCEL NON-BLD 11X100MML (ENDOMECHANICALS) IMPLANT
TROCAR XCEL NON-BLD 5MMX100MML (ENDOMECHANICALS) ×3 IMPLANT
TUBE CONNECTING 12'X1/4 (SUCTIONS) ×1
TUBE CONNECTING 12X1/4 (SUCTIONS) ×1 IMPLANT
TUNNELER SHEATH ON-Q 16GX12 DP (PAIN MANAGEMENT) ×2 IMPLANT
YANKAUER SUCT BULB TIP NO VENT (SUCTIONS) IMPLANT

## 2018-11-27 NOTE — Consult Note (Addendum)
NAME:  Jesse Wheeler, MRN:  [id number], DOB:  [birth year], LOS: 0 ADMISSION DATE:  11/27/2018, CONSULTATION DATE:  11/27/18 REFERRING MD:  Dr. Barry Dienes / [facility] CHIEF COMPLAINT:  Post operative Gastrectomy    Brief History   78 y/o M who presented to [facility] 7/14 for planned distal gastrectomy in the setting of adenocarcinoma.  The patient is followed by the [facility] Cardiology / Pulmonary.  He was successfully extubated post-operatively. PCCM consulted for pulmonary evaluation.   History of present illness   78 y/o M who presented to [facility] 7/14 for planned distal gastrectomy.  He has a PMH of anemia which was evaluated by Dr. 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 EUS 6/4/20showed uT3N0 tumor in the antrum. Colonoscopy was negative at that time.  The patient was cleared per Cardiology & Pulmonary through the [facility] system as "high risk" with recommendations to be extubated to BiPAP.    Past Medical History  MI - s/p stnet  CVA  Chronic Anticoagulation - on Xarelto  RBBB Pulmonary Fibrosis  Former Smoker  OSA  Chronic Renal Insufficiency  HTN  HLD DM II   Significant Hospital Events   7/14 Admit for planned distal gastrectomy, extubated post op  Consults:  PCCM   Procedures:  7/14 Distal Gastrectomy  Significant Diagnostic Tests:  EGD 09/13/18 >> non-bleeding gastric ulcer, irregular mucosa CT Chest / ABD / Pelvis 09/21/18 >> primary gastric mass not appreciated by non-contrast CT, no LAN, multiple small bilateral pulmonary nodules, some are clearly benign / calcified (largest 84m), mild pulmonary fibrosis with an apical to basal gradient, 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 rate  19, height 5' 9.5" (1.765 m), weight (!) 139.3 kg, SpO2 95 %.        Intake/Output Summary (Last 24 hours) at 11/27/2018 1532 Last data filed at 11/27/2018 1504 Gross per 24 hour  Intake 2000 ml  Output 500 ml  Net 1500 ml   Filed Weights   11/27/18 1011  Weight: (!) 139.3 kg    Examination: General: uncomfortable appearing morbidly obese male lying in bed HEENT: MM pink/moist Neuro: non-focal, awakens to voice, MAE CV: s1s2 rrr, no m/r/g PULM: shallow / non-labored, wheezing anterior  GI: obese, tender to touch, surgical dressing c/d/i, OnQ in place Extremities: warm/dry, no edema  Skin: no rashes or lesions  Resolved Hospital Problem list      Assessment & Plan:   OSA  Suspected OHS  -CPAP at home  At Risk Atelectasis / Hypoxia  P: Minimize sedating medications as able - dilaudid PCA, reduced dose  Continue OnQ for pain control  Pulmonary hygiene - IS / flutter valve, mobilize   COPD  Pulmonary Fibrosis  Pulmonary Nodules P: Duoneb Q6 + Budesonide BID  Assess CXR now  Outpatient follow up of Pulmonary Nodules   Adenocarcinoma s/p Distal Gastrectomy  IDA P: Post-operative care per [facility] Pain control per Primary   CKD P: Trend BMP / urinary output Replace electrolytes as indicated Avoid nephrotoxic agents, ensure adequate renal perfusion Assess labs now   Hyperglycemia  P: SSI   HTN HLD P: Per primary   Best practice:  Diet: NPO  Pain/Anxiety/Delirium protocol (  if indicated): per primary  VAP protocol (if indicated): n/a DVT prophylaxis: SCD's GI prophylaxis: PPI  Glucose control: SSI Mobility: OOB as tolerated  Code Status: Full Code  Family Communication: Per Primary  Disposition: Progressive Care   Labs   CBC: No results for input(s): WBC, NEUTROABS, HGB, HCT, MCV, PLT in the last 168 hours.  Basic Metabolic Panel: No results for input(s): NA, K, CL, CO2, GLUCOSE, BUN, CREATININE, CALCIUM, MG, PHOS in the last 168 hours. GFR:  Estimated Creatinine Clearance: 40.5 mL/min (A) (by C-G formula based on SCr of 2.17 mg/dL (H)). No results for input(s): PROCALCITON, WBC, LATICACIDVEN in the last 168 hours.  Liver Function Tests: No results for input(s): AST, ALT, ALKPHOS, BILITOT, PROT, ALBUMIN in the last 168 hours. No results for input(s): LIPASE, AMYLASE in the last 168 hours. No results for input(s): AMMONIA in the last 168 hours.  ABG    Component Value Date/Time   TCO2 22 05/01/2018 0736     Coagulation Profile: Recent Labs  Lab 11/27/18 1008  INR 1.1    Cardiac Enzymes: No results for input(s): CKTOTAL, CKMB, CKMBINDEX, TROPONINI in the last 168 hours.  HbA1C: Hgb A1c MFr Bld  Date/Time Value Ref Range Status  11/14/2018 12:08 PM 7.7 (H) 4.8 - 5.6 % Final    Comment:    (NOTE) Pre diabetes:          5.7%-6.4% Diabetes:              >6.4% Glycemic control for   <7.0% adults with diabetes   12/15/2014 05:50 AM 7.2 (H) 4.8 - 5.6 % Final    Comment:    (NOTE)         Pre-diabetes: 5.7 - 6.4         Diabetes: >6.4         Glycemic control for adults with diabetes: <7.0     CBG: Recent Labs  Lab 11/27/18 1012 11/27/18 1459  GLUCAP 213* 197*    Review of Systems:    Unable to complete due to post operative anesthesia.  Past Medical History  He,  has a past medical history of Actinic keratoses (08/09/2013), Acute inferior myocardial infarction ([place]), Anemia, Bowen's disease, Cancer ([place]), Cataract, Chronic kidney disease, Coronary artery disease, Diabetes mellitus, Dupuytren's contracture of left hand, Hearing loss, Heart murmur, History of colonic polyps (04/14/2014), Hypercholesterolemia, Hyperlipidemia, Hypertension, Obesity, Onychomycosis, ONYCHOMYCOSIS (09/07/2006), Panic disorder, Peyronie's disease, Prostatitis, chronic, Right bundle branch block, S/P coronary angioplasty, Sleep apnea, Stroke ([place]), and Tinnitus.   Surgical History    Past Surgical History:  Procedure Laterality  Date  . CATARACT EXTRACTION W/ INTRAOCULAR LENS  IMPLANT, BILATERAL    . CHOLECYSTECTOMY    . COLONOSCOPY  03/09/2004   Normal  . CORONARY ANGIOPLASTY  01/02/01   Acute inferior MI -- Stent placement in the proximal RCA (three separate stents totalling approximately 55 mm of stent with residual thrombus in the posterior descending artery) --  Relatively mild inferior basilar hypokinesia --Severe stenosis in a secondary marginal vessel of the left circumflex -- Minimal coronary atherosclerosis otherwise of the coronary system   . ESOPHAGOGASTRODUODENOSCOPY    . ESOPHAGOGASTRODUODENOSCOPY (EGD) WITH PROPOFOL N/A 10/18/2018   Procedure: ESOPHAGOGASTRODUODENOSCOPY (EGD) WITH PROPOFOL;  Surgeon: Milus Banister, MD;  Location: [facility] END[facility];  Service: Endoscopy;  Laterality: N/A;  . EUS N/A 10/18/2018   Procedure: UPPER ENDOSCOPIC ULTRASOUND (EUS) RADIAL;  Surgeon: Milus Banister, MD;  Location: [facility] ENDOSCOPY;  Service: Endoscopy;  Laterality: N/A;  .  EYE SURGERY Right 04/2018   eye hole repaired  . FASCIECTOMY Right 05/01/2018   Procedure: FASCIECTOMY RIGHT RING FINGER;  Surgeon: Daryll Brod, MD;  Location: [place];  Service: Orthopedics;  Laterality: Right;  . Nuclear stress test  Oct 2011    Dense inferior infarct; mild ischemia with dilated LV. EF was 39%. Study was felt to be unchanged and he has been managed medically  . TRIGGER FINGER RELEASE Right 05/01/2018   Procedure: RELEASE TRIGGER FINGER RIGHT RING FINGER;  Surgeon: Daryll Brod, MD;  Location: [place];  Service: Orthopedics;  Laterality: Right;     Social History   reports that he quit smoking about 37 years ago. His smoking use included cigarettes. He has a 20.00 pack-year smoking history. He has never used smokeless tobacco. He reports that he does not drink alcohol or use drugs.   Family History   His family history includes Diabetes in his sister; Heart attack in his father; Heart disease in his  father; Transient ischemic attack in his sister. There is no history of Colon cancer.   Allergies No Known Allergies   Home Medications  Prior to Admission medications   Medication Sig Start Date End Date Taking? Authorizing Provider  aspirin EC 81 MG tablet Take 81 mg by mouth.   Yes [provider]  doxazosin (CARDURA) 8 MG tablet Take 8 mg by mouth daily.  07/31/12  Yes [provider]  ERGOCALCIFEROL PO Take 8,000 Units by mouth every 30 (thirty) days.    Yes [provider]  ezetimibe (ZETIA) 10 MG tablet Take 10 mg by mouth daily.    Yes [provider]  ferrous sulfate 325 (65 FE) MG tablet Take 325 mg by mouth daily with breakfast.   Yes [provider]  furosemide (LASIX) 20 MG tablet Take 20 mg by mouth daily.    Yes [provider]  gabapentin (NEURONTIN) 300 MG capsule Take 900 mg by mouth 3 (three) times daily.    Yes [provider]  insulin aspart (NOVOLOG) 100 UNIT/ML injection Inject 8-40 Units into the skin 3 (three) times daily with meals.    Yes [provider]  insulin glargine (LANTUS) 100 UNIT/ML injection Inject 60-65 Units into the skin See admin instructions. 65 units in the morning, 60 units at bedtime   Yes [provider]  isosorbide mononitrate (IMDUR) 30 MG 24 hr tablet TAKE 2 TABLETS PO DAILY Patient taking differently: Take 60 mg by mouth daily.  05/31/13  Yes Minus Breeding, MD  lisinopril (ZESTRIL) 40 MG tablet Take 20 mg by mouth daily.   Yes [provider]  metoprolol succinate (TOPROL-XL) 25 MG 24 hr tablet Take 12.5 mg by mouth daily.   Yes [provider]  nitroGLYCERIN (NITROSTAT) 0.4 MG SL tablet Place 1 tablet (0.4 mg total) under the tongue every 5 (five) minutes as needed. 10/15/12  Yes Minus Breeding, MD  omeprazole (PRILOSEC) 40 MG capsule Take 1 capsule (40 mg total) by mouth daily. 09/13/18  Yes Gatha Mayer, MD  Semaglutide [facility]) 1 MG/DOSE  SOPN Inject 1 mg into the skin every Friday.    Yes [provider]  senna-docusate (SENOKOT-S) 8.6-50 MG tablet Take 1 tablet by mouth daily.    Yes [provider]  simvastatin (ZOCOR) 40 MG tablet Take 40 mg by mouth daily.   Yes [provider]  temazepam (RESTORIL) 15 MG capsule Take 15 mg by mouth at bedtime  as needed for sleep.   Yes [provider]  Blood Glucose Monitoring Suppl (FIFTY50 GLUCOSE METER 2.0) W/DEVICE KIT by Does not apply route.    [provider]  lisinopril (PRINIVIL,ZESTRIL) 20 MG tablet Take 1 tablet (20 mg total) by mouth daily. Patient not taking: Reported on 11/09/2018 01/27/12   Burtis Junes, NP  [facility] VERIO test strip  01/27/15   [provider]  Rivaroxaban (XARELTO) 15 MG TABS tablet Take 1 tablet by mouth once daily with a meal Patient taking differently: Take 15 mg by mouth daily with supper. Take 1 tablet by mouth once daily with a meal 02/12/15   Minus Breeding, MD     Critical care time: n/a    Noe Gens, NP-C [facility] Pgr: [phone] or if no answer [phone] 11/27/2018, 4:14 PM  Attending Note:  78 year old male with history of UIP and OSA on CPAP who presents to PCCM with post op hypoxemia, OSA and UIP management.  He c/o abdominal pain.  On exam, extremely tender abdomen with transmitted upper airway sounds diffusely.  I reviewed chest CT myself, pulmonary nodules noted and bronchiectasis.  Discussed with PCCM-NP.  OSA:  - Attempt full face mask inspite of NGT  - Minimize narcotics as able  Hypoxemia:  - Titrate O2 for sat of 88-92%  - May need an ambulatory desaturation study prior to discharge for ?home O2 need  Wheezing:  - Racemic epi as needed  - BD as ordered  - No need for systemic steroids at this time  - Inhaled steroids ordered  IPF:  - No need systemic steroids at this time  Pulmonary nodules:  - F/U with [place] hospital, no need to start work  up process.  PCCM will f/u.  Marland KitchenPatient seen and examined, agree with above note.  I dictated the care and orders written for this patient under my direction.  Rush Farmer, [place]

## 2018-11-27 NOTE — Anesthesia Procedure Notes (Signed)
Procedure Name: Intubation Date/Time: 11/27/2018 12:42 PM Performed by: Lidia Collum, MD Pre-anesthesia Checklist: Patient identified, Emergency Drugs available, Suction available and Patient being monitored Patient Re-evaluated:Patient Re-evaluated prior to induction Oxygen Delivery Method: Circle System Utilized Preoxygenation: Pre-oxygenation with 100% oxygen Induction Type: IV induction Ventilation: Mask ventilation without difficulty and Oral airway inserted - appropriate to patient size Laryngoscope Size: Mac and 4 Grade View: Grade I Tube type: Oral Number of attempts: 1 Airway Equipment and Method: Stylet and Oral airway Placement Confirmation: ETT inserted through vocal cords under direct vision,  positive ETCO2 and breath sounds checked- equal and bilateral Secured at: 24 cm Tube secured with: Tape Dental Injury: Teeth and Oropharynx as per pre-operative assessment

## 2018-11-27 NOTE — Progress Notes (Signed)
Patient is vomiting green stuff after surgery.  Called doctor and he wants to make sure suction is working properly.

## 2018-11-27 NOTE — Anesthesia Preprocedure Evaluation (Addendum)
Anesthesia Evaluation  Patient identified by MRN, date of birth, ID band Patient awake    Reviewed: Allergy & Precautions, NPO status , Patient's Chart, lab work & pertinent test results, reviewed documented beta blocker date and time   History of Anesthesia Complications Negative for: history of anesthetic complications  Airway Mallampati: II  TM Distance: >3 FB Neck ROM: Full    Dental  (+) Teeth Intact   Pulmonary sleep apnea and Continuous Positive Airway Pressure Ventilation , former smoker,    Pulmonary exam normal        Cardiovascular Exercise Tolerance: Good hypertension, Pt. on home beta blockers and Pt. on medications + CAD, + Past MI, + Cardiac Stents (2002) and +CHF  Normal cardiovascular exam+ dysrhythmias Atrial Fibrillation      Neuro/Psych CVA (2016) negative psych ROS   GI/Hepatic Neg liver ROS, GERD  ,  Endo/Other  diabetes, Type 2, Insulin DependentMorbid obesity  Renal/GU Renal InsufficiencyRenal disease  negative genitourinary   Musculoskeletal negative musculoskeletal ROS (+)   Abdominal   Peds  Hematology negative hematology ROS (+)   Anesthesia Other Findings From Cardiologist: "Mr. Boomhower was last seen in this clinic 03/04/2018 and has no CV symptoms at 4-7 METS of activity.  His last stress test was 03/04/2017 and showed known inferior infarct without significant reversible ischemia.  He has had no recent cardiac hospitalizations and within the last 6 months has not had ACS, malignant arrhythmia, CHF or severe valvular heart disease... He has high risk for perioperative CV complications based on inherent risk factors as listed above.  However he maintains good functional status with no CV symptoms and has not had no acute CV exacerbations or hospitalizations in the last 6 months.  Based on AHA/ACC guidelines, he may continue to surgery without further testing."  TTE 03/2017: EF 40-45%, normal RV  function, no significant valve abnormality  Reproductive/Obstetrics                            Anesthesia Physical  Anesthesia Plan  ASA: III  Anesthesia Plan: General   Post-op Pain Management:    Induction: Intravenous and Rapid sequence  PONV Risk Score and Plan: 2 and Treatment may vary due to age or medical condition, Ondansetron and Dexamethasone  Airway Management Planned: Oral ETT  Additional Equipment: Arterial line  Intra-op Plan:   Post-operative Plan: Extubation in OR  Informed Consent: I have reviewed the patients History and Physical, chart, labs and discussed the procedure including the risks, benefits and alternatives for the proposed anesthesia with the patient or authorized representative who has indicated his/her understanding and acceptance.     Dental advisory given  Plan Discussed with:   Anesthesia Plan Comments:        Anesthesia Quick Evaluation

## 2018-11-27 NOTE — Interval H&P Note (Signed)
History and Physical Interval Note:  11/27/2018 11:35 AM  Jesse Wheeler  has presented today for surgery, with the diagnosis of GASTRIC CANCER.  The various methods of treatment have been discussed with the patient and family. After consideration of risks, benefits and other options for treatment, the patient has consented to  Procedure(s): LAPAROSCOPY DIAGNOSTIC (N/A) DISTAL GASTRECTOMY (N/A) as a surgical intervention.  The patient's history has been reviewed, patient examined, no change in status, stable for surgery.  I have reviewed the patient's chart and labs.  Questions were answered to the patient's satisfaction.     [name]

## 2018-11-27 NOTE — Op Note (Signed)
PRE-OPERATIVE DIAGNOSIS: adenocarcinoma of the gastric antrum, uT3N0  POST-OPERATIVE DIAGNOSIS:  Same  PROCEDURE:  Procedure(s): Diagnostic laparoscopy, distal gastrectomy with retrocolic Billroth II anastamosis  SURGEON:  Surgeon(s): [name], MD  ASSISTANT:   Armandina Gemma, MD Judyann Munson, RNFA  ANESTHESIA:   local and general  DRAINS: OnQ pain pump   LOCAL MEDICATIONS USED:  BUPIVICAINE  and LIDOCAINE   SPECIMEN:  Source of Specimen:  distal stomach and omentum  DISPOSITION OF SPECIMEN:  PATHOLOGY  COUNTS:  YES  DICTATION: .Dragon Dictation  PLAN OF CARE: Admit to inpatient   PATIENT DISPOSITION:  PACU - hemodynamically stable.  FINDINGS:  No evidence of carcinomatosis, mass in distal stomach, lesser curve, several enlarged LN.    EBL: 50 mL  PROCEDURE:    The patient was identified in the holding area and was taken to the OR where he was placed supine on the operating room table.  General anesthesia was induced.  Foley catheter was placed.  Abdomen was clipped, prepped and draped in sterile fashion.  Timeout was performed according to the surgical safety checklist.  When all was correct, we continued.    The patient was rotated to the left and placed into reverse trendelenburg position.  A 5 mm incision was made at the costal margin and a 5 mm Optiview trocar was placed under direct visualization.  Pneumoperitoneum was achieved to a pressure of 15 mm Hg.  A second 5 mm trocar was placed in the upper midline.  The abdomen was examined with the camera and no evidence of carcinomatosis was seen.    An upper midline incision was made in the abdomen and the abdomen was then manually explored.  The stomach was mobile, and the pelvis and undersurface of the liver was palpated.  Again, no carcinomatosis was identified.  The gastric cancer itself was not palpated.    The Bookwalter was used to assist with visualization.  The stomach was mobilized.  The duodenum was  kocherized with blunt dissection and cautery.  The omentum was taken off the colon and the lesser sac was entered.  The Harmonic was used to take down the gastroepiploics and the edge of the lesser curve and greater curve were exposed.  Two blue loads of the GIA 75 mm stapler were used to divide the stomach.  The harmonic was then used to dissect superiorly and inferiorly.  Once the duodenal bulb was identified, it was divided with the TA 60 green load.  The specimen was opened on the back table.  It was marked with sutures and sent to pathology.  The staple line of the stomach were oversewn with 3-0 PDS suture.    The gastrojejunostomy was then performed. The mesentery was a bit foreshortened and the small bowel would not comfortably reach over the transverse colon. A window was made in the transverse colon mesentery for the gastrojejunostomy.  The jejunum was secured to the posterior wall of the stomach with two 3-0 silk sutures.  The stomach and jejunum were opened, and the 75 mm stapler was used to create a stapled linear anastamosis. The NGT was advanced into the efferent limb of the jejunum, and the defect was closed with two 3-0 PDS sutures in connell fashion.  The NGT was secured.  Additional silk sutures were used as anti-tension sutures.    The abdomen was then irrigated.  A sponge count was performed.  The OnQ tunnelers were placed on either side of the incision in the preperitoneal space.  The fascia was then closed with two #1 looped PDS sutures.  The skin was irrigated and closed with staples.  The OnQ catheters were advanced into the tunneler sheaths and the sheaths were removed.  The abdomen was then cleaned, dried, and dressed with dry sterile dressings.    Needle, sponge, and instrument counts were correct x 2.  The patient was allowed to emerge from anesthesia and taken to the PACU in stable condition.

## 2018-11-27 NOTE — Consult Note (Addendum)
Cardiology Consultation:   Patient ID: ALMUS WOODHAM MRN: [id number]; DOB: [birth year]  Admit date: 11/27/2018 Date of Consult: 11/27/2018  Primary Care Provider: Bernerd Limbo, MD Primary Cardiologist: Minus Breeding, MD  And the [place] Primary Electrophysiologist:  None    Patient Profile:   JONTEZ REDFIELD is a 78 y.o. male with a hx of PAF on Xarelto, CAD s/p MI with PCI 2001, hyperlipidemia, TIA 2016, ICM with EF 40-45%, OSA on CPAP who is being seen today for his significant cardiac history and is now post op at the request of Dr. Barry Dienes.  History of Present Illness:   Mr. Alcorta has undergone laparoscopic distal gastrectomy with retrocolic Billroth II anastamosis today for adenocarcinoma of the gastric antrum. Cardiology was called for consult due to his significant cardiac history. Mr. Brigham has been seen by Dr. Percival Spanish in the past and more recently followed at the [facility].   Mr. Freiberger has history as above. He was last seen at the [place] on 02/21/2018 at which time he was stable, exercising with his wife without any exertional symptoms. 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 He is a prior smoker having quit 35 years ago, rare alcohol, per note review.   Note from cardiology at the [facility] for surgical clearance on 09/25/2018 indicated that pt was able to complete 4-7 Mets of activity. Most recent stress test in 02/2017 shown known inferior infarct with significant reversible ischemia. His RCRI was 5 for SCr >2, IDDM, Intra abdominal surgery, and ischemic heart disease. He was considered high risk for perioperative CV complications, however, with good functional status he did not require any cardiac testing prior to surgery. Per Dr. Tora Duck can be held for 48-72 hours without bridging surrounding surgery.   Mr. Ishmael is seen in PACU and is having a lot of pain. He is unable to answer my questions and he cannot tolerate being touched. Nurse is working on getting a PCA pump. His BP  is stable. HR is 102 bpm- difficult to discern rhythm. It is regular , but difficult to see p waves on single lead in PACU. He has an NG tube in place. He denies chest pain or shortness of breath.   Heart Pathway Score:     Past Medical History:  Diagnosis Date  . Actinic keratoses 08/09/2013   Overview:  IMPRESSION: Liquid nitrogen x2   . Acute inferior myocardial infarction ([place])    stenting in 2002 to the RCA  . Anemia   . Bowen's disease   . Cancer [facility])    recent  dx stomach cancer per pt 11/14/18  . Cataract    bilateral - surgery to remove  . Chronic kidney disease   . Coronary artery disease   . Diabetes mellitus    type II  . Dupuytren's contracture of left hand   . Hearing loss    bilateral - has hearing aids  . Heart murmur   . History of colonic polyps 04/14/2014  . Hypercholesterolemia   . Hyperlipidemia   . Hypertension    LCX  . Obesity   . Onychomycosis   . ONYCHOMYCOSIS 09/07/2006   Qualifier: Diagnosis of  By: Cletus Gash MD, [place]    . Panic disorder   . Peyronie's disease   . Prostatitis, chronic   . Right bundle branch block   . S/P coronary angioplasty   . Sleep apnea    uses cpap nightly  . Stroke [facility])    takes xarelto  .  Tinnitus     Past Surgical History:  Procedure Laterality Date  . CATARACT EXTRACTION W/ INTRAOCULAR LENS  IMPLANT, BILATERAL    . CHOLECYSTECTOMY    . COLONOSCOPY  03/09/2004   Normal  . CORONARY ANGIOPLASTY  01/02/01   Acute inferior MI -- Stent placement in the proximal RCA (three separate stents totalling approximately 55 mm of stent with residual thrombus in the posterior descending artery) --  Relatively mild inferior basilar hypokinesia --Severe stenosis in a secondary marginal vessel of the left circumflex -- Minimal coronary atherosclerosis otherwise of the coronary system   . ESOPHAGOGASTRODUODENOSCOPY    . ESOPHAGOGASTRODUODENOSCOPY (EGD) WITH PROPOFOL N/A 10/18/2018   Procedure: ESOPHAGOGASTRODUODENOSCOPY (EGD) WITH  PROPOFOL;  Surgeon: Milus Banister, MD;  Location: [facility];  Service: Endoscopy;  Laterality: N/A;  . EUS N/A 10/18/2018   Procedure: UPPER ENDOSCOPIC ULTRASOUND (EUS) RADIAL;  Surgeon: Milus Banister, MD;  Location: [facility];  Service: Endoscopy;  Laterality: N/A;  . EYE SURGERY Right 04/2018   eye hole repaired  . FASCIECTOMY Right 05/01/2018   Procedure: FASCIECTOMY RIGHT RING FINGER;  Surgeon: Daryll Brod, MD;  Location: [place];  Service: Orthopedics;  Laterality: Right;  . Nuclear stress test  Oct 2011    Dense inferior infarct; mild ischemia with dilated LV. EF was 39%. Study was felt to be unchanged and he has been managed medically  . TRIGGER FINGER RELEASE Right 05/01/2018   Procedure: RELEASE TRIGGER FINGER RIGHT RING FINGER;  Surgeon: Daryll Brod, MD;  Location: [place];  Service: Orthopedics;  Laterality: Right;     Home Medications:  Prior to Admission medications   Medication Sig Start Date End Date Taking? Authorizing Provider  aspirin EC 81 MG tablet Take 81 mg by mouth.   Yes [provider]  doxazosin (CARDURA) 8 MG tablet Take 8 mg by mouth daily.  07/31/12  Yes [provider]  ERGOCALCIFEROL PO Take 8,000 Units by mouth every 30 (thirty) days.    Yes [provider]  ezetimibe (ZETIA) 10 MG tablet Take 10 mg by mouth daily.    Yes [provider]  ferrous sulfate 325 (65 FE) MG tablet Take 325 mg by mouth daily with breakfast.   Yes [provider]  furosemide (LASIX) 20 MG tablet Take 20 mg by mouth daily.    Yes [provider]  gabapentin (NEURONTIN) 300 MG capsule Take 900 mg by mouth 3 (three) times daily.    Yes [provider]  insulin aspart (NOVOLOG) 100 UNIT/ML injection Inject 8-40 Units into the skin 3 (three) times daily with meals.    Yes [provider]  insulin glargine (LANTUS) 100 UNIT/ML injection Inject 60-65 Units into the skin See  admin instructions. 65 units in the morning, 60 units at bedtime   Yes [provider]  isosorbide mononitrate (IMDUR) 30 MG 24 hr tablet TAKE 2 TABLETS PO DAILY Patient taking differently: Take 60 mg by mouth daily.  05/31/13  Yes Minus Breeding, MD  lisinopril (ZESTRIL) 40 MG tablet Take 20 mg by mouth daily.   Yes [provider]  metoprolol succinate (TOPROL-XL) 25 MG 24 hr tablet Take 12.5 mg by mouth daily.   Yes [provider]  nitroGLYCERIN (NITROSTAT) 0.4 MG SL tablet Place 1 tablet (0.4 mg total) under the tongue every 5 (five) minutes as needed. 10/15/12  Yes Minus Breeding, MD  omeprazole (PRILOSEC) 40 MG capsule Take 1 capsule (40 mg total) by  mouth daily. 09/13/18  Yes Gatha Mayer, MD  Semaglutide [facility]) 1 MG/DOSE SOPN Inject 1 mg into the skin every Friday.    Yes [provider]  senna-docusate (SENOKOT-S) 8.6-50 MG tablet Take 1 tablet by mouth daily.    Yes [provider]  simvastatin (ZOCOR) 40 MG tablet Take 40 mg by mouth daily.   Yes [provider]  temazepam (RESTORIL) 15 MG capsule Take 15 mg by mouth at bedtime as needed for sleep.   Yes [provider]  Blood Glucose Monitoring Suppl (FIFTY50 GLUCOSE METER 2.0) W/DEVICE KIT by Does not apply route.    [provider]  lisinopril (PRINIVIL,ZESTRIL) 20 MG tablet Take 1 tablet (20 mg total) by mouth daily. Patient not taking: Reported on 11/09/2018 01/27/12   Burtis Junes, NP  [facility] VERIO test strip  01/27/15   [provider]  Rivaroxaban (XARELTO) 15 MG TABS tablet Take 1 tablet by mouth once daily with a meal Patient taking differently: Take 15 mg by mouth daily with supper. Take 1 tablet by mouth once daily with a meal 02/12/15   Minus Breeding, MD    Inpatient Medications: Scheduled Meds: . albuterol      . Chlorhexidine Gluconate Cloth  6 each Topical Once   And  . Chlorhexidine Gluconate Cloth  6 each Topical Once  .  fentaNYL      . HYDROmorphone      . scopolamine  1 patch Transdermal On Call to OR   Continuous Infusions: . sodium chloride 10 mL/hr at 11/27/18 1024  . bupivacaine 0.25 % ON-Q pump DUAL CATH 300 mL     PRN Meds: fentaNYL (SUBLIMAZE) injection, HYDROmorphone (DILAUDID) injection, ondansetron (ZOFRAN) IV, oxyCODONE **OR** oxyCODONE  Allergies:   No Known Allergies  Social History:   Social History   Socioeconomic History  . Marital status: Married    Spouse name: Meryl Crutch  . Number of children: 1  . Years of education: Not on file  . Highest education level: Not on file  Occupational History  . Occupation: Retired  Scientific laboratory technician  . Financial resource strain: Not on file  . Food insecurity    Worry: Not on file    Inability: Not on file  . Transportation needs    Medical: Not on file    Non-medical: Not on file  Tobacco Use  . Smoking status: Former Smoker    Packs/day: 1.00    Years: 20.00    Pack years: 20.00    Types: Cigarettes    Quit date: 11/17/1981    Years since quitting: 37.0  . Smokeless tobacco: Never Used  Substance and Sexual Activity  . Alcohol use: No    Alcohol/week: 0.0 standard drinks  . Drug use: No  . Sexual activity: Not on file  Lifestyle  . Physical activity    Days per week: Not on file    Minutes per session: Not on file  . Stress: Not on file  Relationships  . Social Herbalist on phone: Not on file    Gets together: Not on file    Attends religious service: Not on file    Active member of club or organization: Not on file    Attends meetings of clubs or organizations: Not on file    Relationship status: Not on file  . Intimate partner violence    Fear of current or ex partner: Not on file    Emotionally abused: Not on  file    Physically abused: Not on file    Forced sexual activity: Not on file  Other Topics Concern  . Not on file  Social History Narrative   He is a retired Firefighter, he also served as  a Psychiatrist.   Daily caffeine drinks    He is married and he has 1 son    Family History:    Family History  Problem Relation Age of Onset  . Heart attack Father   . Heart disease Father   . Diabetes Sister   . Transient ischemic attack Sister   . Colon cancer Neg Hx      ROS:  Please see the history of present illness.   All other ROS reviewed and negative.     Physical Exam/Data:   Vitals:   11/27/18 1011 11/27/18 1500 11/27/18 1515 11/27/18 1530  BP: (!) 159/85 (!) 148/84 117/81 121/81  Pulse: 94 96 98 (!) 103  Resp: 18 (!) '22 19 19  ' Temp: 98.6 F (37 C) (!) 97.2 F (36.2 C)    TempSrc: Oral     SpO2: 95% 94% 95% 96%  Weight: (!) 139.3 kg     Height: 5' 9.5" (1.765 m)       Intake/Output Summary (Last 24 hours) at 11/27/2018 1535 Last data filed at 11/27/2018 1504 Gross per 24 hour  Intake 2000 ml  Output 500 ml  Net 1500 ml   Last 3 Weights 11/27/2018 11/14/2018 10/18/2018  Weight (lbs) 307 lb 307 lb 1 oz 300 lb  Weight (kg) 139.254 kg 139.283 kg 136.079 kg     Body mass index is 44.69 kg/m.  General:  Well nourished, well developed, in no acute distress HEENT: normal Lymph: no adenopathy Neck: no JVD Vascular: No carotid bruits; FA pulses 2+ bilaterally without bruits  Cardiac:  normal S1, S2; RRR; no murmur  Lungs:  clear to auscultation bilaterally, no wheezing, rhonchi or rales  Abd: very tender Ext: no edema Musculoskeletal:  No deformities, BUE and BLE strength normal and equal Skin: warm and dry  Neuro:  CNs 2-12 intact, no focal abnormalities noted Psych:  Normal affect   EKG:  None Telemetry:  Telemetry was personally reviewed and demonstrates:  Sinus tach 102 bpm.   Relevant CV Studies:  See HPI for review.   Laboratory Data:  High Sensitivity Troponin:  No results for input(s): TROPONINIHS in the last 720 hours.   Cardiac EnzymesNo results for input(s): TROPONINI in the last 168 hours. No  results for input(s): TROPIPOC in the last 168 hours.  ChemistryNo results for input(s): NA, K, CL, CO2, GLUCOSE, BUN, CREATININE, CALCIUM, GFRNONAA, GFRAA, ANIONGAP in the last 168 hours.  No results for input(s): PROT, ALBUMIN, AST, ALT, ALKPHOS, BILITOT in the last 168 hours. HematologyNo results for input(s): WBC, RBC, HGB, HCT, MCV, MCH, MCHC, RDW, PLT in the last 168 hours. BNPNo results for input(s): BNP, PROBNP in the last 168 hours.  DDimer No results for input(s): DDIMER in the last 168 hours.   Radiology/Studies:  No results found.  Assessment and Plan:   Atrial fibrillation -Found in setting of TIA in 2016.  -On Xarelto for stroke risk reduction. Now on hold for surgery.  -Restart xarelto when OK with surgery.  -Monitor on telemetry -Cardiology will follow along  CAD -s/p MI with PCI 2001 -On aspirin, statin, BB, Imdur 60 mg at home. Pt has NG tube and NPO now. Resume  home meds when taking po. Will give low dose metoprolol IV for now.  -Watch CBC for any drops in Hgb.   Ischemic CM -EF 40-45% with mild concentric LVH by echo 03/22/2017. Mod-severe hypokinesis in the basal to inferior and inferolateral walls extending to the anterolateral wall consistent with the prior infarct. No significant valvular abnormalities.  -On lisinopril 20 mg daily, Metoprolol succinate 12.5 mg daily, lasix 20 mg at home. Now on hold. Resume when take orals.  -Monitor BP.   CKD -SCr was 2.09 in 07/2017. SCr 2.17 on 11/14/2018 -Monitor renal function  Obesity -Body mass index is 44.69 kg/m. -Advise to work on wt loss once he recovers from this surgery.   Diabetes type 2 on insulin -A1c 7.7. SSI while in hospital.  Hyperlipidemia -On simvastatin 40 mg daily, zetia 10 mg -LDL was 36 in 10/2016  OSA on CPAP -CPAP at hs       For questions or updates, please contact [place] Please consult [email or web address] for contact info under     Signed, Daune Perch, NP  11/27/2018 3:35  PM As above, patient seen and examined.  Briefly he is a 78 year old male with past medical history of paroxysmal atrial fibrillation, coronary artery disease with prior PCI, hyperlipidemia, hypertension, prior TIA, ischemic cardiomyopathy with ejection fraction 40 to 45%, obstructive sleep apnea and now status post resection of gastric adenocarcinoma for evaluation of hypertension and coronary disease.  Patient is status post resection of gastric adenocarcinoma earlier today.  Postoperatively he is complaining of abdominal pain.  There is no dyspnea, chest pain, palpitations.  Cardiology asked to assist with management of blood pressure and cardiac medications. Electrocardiogram shows sinus tachycardia with first-degree AV block, right bundle branch block, inferior infarct with subtle ST elevation but unchanged compared to previous.  1 paroxysmal atrial fibrillation-patient remains in sinus rhythm today.  He will be at increased risk for postoperative atrial fibrillation.  He is presently n.p.o.  We will treat with metoprolol 2.5 mg IV every 6 hours.  Anticoagulation on hold given recent surgery but will resume postoperatively when okay with general surgery. CHADSvasc 7.   2 coronary artery disease-we will resume statin when able.  Will discontinue aspirin as he requires anticoagulation for paroxysmal atrial fibrillation.  3 hypertension-patient is n.p.o.  We will treat with metoprolol 2.5 mg IV every 6.  We will add hydralazine as needed.  Once he is able to take oral medications we will resume lisinopril and isosorbide.  4 chronic stage III kidney disease-follow renal function closely.  5 history of chronic diastolic congestive heart failure-we will hold Lasix for now as he is n.p.o.  Follow closely for volume excess.  6 ischemic cardiomyopathy-we will resume ACE inhibitor when able postoperatively.  We will treat with IV Lopressor and change back to Toprol when tolerating.  7 status post  resection of gastric adenocarcinoma-Per general surgery.  Kirk Ruths, MD

## 2018-11-27 NOTE — Anesthesia Procedure Notes (Signed)
Arterial Line Insertion Start/End7/14/2020 11:45 AM, 11/27/2018 12:05 PM Performed by: Leonor Liv, CRNA  Patient location: Pre-op. Preanesthetic checklist: patient identified, IV checked, site marked, risks and benefits discussed, surgical consent, monitors and equipment checked, pre-op evaluation, timeout performed and anesthesia consent Lidocaine 1% used for infiltration Left, radial was placed Catheter size: 20 G Hand hygiene performed  and maximum sterile barriers used  Allen's test indicative of satisfactory collateral circulation Attempts: 1 Procedure performed without using ultrasound guided technique. Following insertion, dressing applied and Biopatch. Post procedure assessment: normal  Patient tolerated the procedure well with no immediate complications.

## 2018-11-27 NOTE — Transfer of Care (Addendum)
Immediate Anesthesia Transfer of Care Note  Patient: Jesse Wheeler  Procedure(s) Performed: LAPAROSCOPY DIAGNOSTIC (N/A Abdomen) DISTAL GASTRECTOMY (N/A Abdomen)  Patient Location: PACU  Anesthesia Type:General  Level of Consciousness: awake, alert  and oriented  Airway & Oxygen Therapy: Patient Spontanous Breathing and Patient connected to face mask oxygen  Post-op Assessment: Report given to RN, Post -op Vital signs reviewed and stable and Patient moving all extremities  Post vital signs: Reviewed and stable  Last Vitals:  Vitals Value Taken Time  BP 148/84 11/27/18 1457  Temp    Pulse 96 11/27/18 1504  Resp 19 11/27/18 1504  SpO2 94 % 11/27/18 1504  Vitals shown include unvalidated device data.  Last Pain:  Vitals:   11/27/18 1022  TempSrc:   PainSc: 0-No pain      Patients Stated Pain Goal: 4 (88/32/54 9826)  Complications: No apparent anesthesia complications

## 2018-11-27 NOTE — Progress Notes (Signed)
Patient has NG tube and Continuous ETCO2 in his nose. CPAP is not able to be placed at this time. Patient on 4L O2 tolerating well.

## 2018-11-28 ENCOUNTER — Encounter (HOSPITAL_COMMUNITY): Payer: Self-pay | Admitting: General Surgery

## 2018-11-28 DIAGNOSIS — I480 Paroxysmal atrial fibrillation: Secondary | ICD-10-CM

## 2018-11-28 LAB — BASIC METABOLIC PANEL
Anion gap: 11 (ref 5–15)
Anion gap: 11 (ref 5–15)
Anion gap: 12 (ref 5–15)
BUN: 27 mg/dL — ABNORMAL HIGH (ref 8–23)
BUN: 27 mg/dL — ABNORMAL HIGH (ref 8–23)
BUN: 28 mg/dL — ABNORMAL HIGH (ref 8–23)
CO2: 19 mmol/L — ABNORMAL LOW (ref 22–32)
CO2: 21 mmol/L — ABNORMAL LOW (ref 22–32)
CO2: 22 mmol/L (ref 22–32)
Calcium: 8.3 mg/dL — ABNORMAL LOW (ref 8.9–10.3)
Calcium: 8.7 mg/dL — ABNORMAL LOW (ref 8.9–10.3)
Calcium: 8.5 mg/dL — ABNORMAL LOW (ref 8.9–10.3)
Chloride: 100 mmol/L (ref 98–111)
Chloride: 103 mmol/L (ref 98–111)
Chloride: 101 mmol/L (ref 98–111)
Creatinine, Ser: 2.54 mg/dL — ABNORMAL HIGH (ref 0.61–1.24)
Creatinine, Ser: 2.26 mg/dL — ABNORMAL HIGH (ref 0.61–1.24)
Creatinine, Ser: 2.32 mg/dL — ABNORMAL HIGH (ref 0.61–1.24)
GFR calc Af Amer: 27 mL/min — ABNORMAL LOW (ref >60)
GFR calc Af Amer: 31 mL/min — ABNORMAL LOW (ref >60)
GFR calc Af Amer: 30 mL/min — ABNORMAL LOW (ref >60)
GFR calc non Af Amer: 24 mL/min — ABNORMAL LOW (ref >60)
GFR calc non Af Amer: 27 mL/min — ABNORMAL LOW (ref >60)
GFR calc non Af Amer: 26 mL/min — ABNORMAL LOW (ref >60)
Glucose, Bld: 349 mg/dL — ABNORMAL HIGH (ref 70–99)
Glucose, Bld: 153 mg/dL — ABNORMAL HIGH (ref 70–99)
Glucose, Bld: 130 mg/dL — ABNORMAL HIGH (ref 70–99)
Potassium: 6.8 mmol/L (ref 3.5–5.1)
Potassium: 5 mmol/L (ref 3.5–5.1)
Potassium: 5 mmol/L (ref 3.5–5.1)
Sodium: 130 mmol/L — ABNORMAL LOW (ref 135–145)
Sodium: 135 mmol/L (ref 135–145)
Sodium: 135 mmol/L (ref 135–145)

## 2018-11-28 LAB — CBC
HCT: 40.5 % (ref 39.0–52.0)
HCT: 39.7 % (ref 39.0–52.0)
Hemoglobin: 13.2 g/dL (ref 13.0–17.0)
Hemoglobin: 12.6 g/dL — ABNORMAL LOW (ref 13.0–17.0)
MCH: 30.9 pg (ref 26.0–34.0)
MCH: 30.7 pg (ref 26.0–34.0)
MCHC: 32.6 g/dL (ref 30.0–36.0)
MCHC: 31.7 g/dL (ref 30.0–36.0)
MCV: 94.8 fL (ref 80.0–100.0)
MCV: 96.6 fL (ref 80.0–100.0)
Platelets: 164 10*3/uL (ref 150–400)
Platelets: 168 10*3/uL (ref 150–400)
RBC: 4.27 MIL/uL (ref 4.22–5.81)
RBC: 4.11 MIL/uL — ABNORMAL LOW (ref 4.22–5.81)
RDW: 14.4 % (ref 11.5–15.5)
RDW: 14.5 % (ref 11.5–15.5)
WBC: 12.8 10*3/uL — ABNORMAL HIGH (ref 4.0–10.5)
WBC: 15.2 10*3/uL — ABNORMAL HIGH (ref 4.0–10.5)
nRBC: 0 % (ref 0.0–0.2)
nRBC: 0 % (ref 0.0–0.2)

## 2018-11-28 LAB — GLUCOSE, CAPILLARY
Glucose-Capillary: 254 mg/dL — ABNORMAL HIGH (ref 70–99)
Glucose-Capillary: 167 mg/dL — ABNORMAL HIGH (ref 70–99)
Glucose-Capillary: 152 mg/dL — ABNORMAL HIGH (ref 70–99)
Glucose-Capillary: 121 mg/dL — ABNORMAL HIGH (ref 70–99)
Glucose-Capillary: 138 mg/dL — ABNORMAL HIGH (ref 70–99)

## 2018-11-28 LAB — PHOSPHORUS: Phosphorus: 2.3 mg/dL — ABNORMAL LOW (ref 2.5–4.6)

## 2018-11-28 LAB — MAGNESIUM: Magnesium: 1.7 mg/dL (ref 1.7–2.4)

## 2018-11-28 LAB — CK: Total CK: 546 U/L — ABNORMAL HIGH (ref 49–397)

## 2018-11-28 MED ORDER — FUROSEMIDE 10 MG/ML IJ SOLN
40.0000 mg | Freq: Two times a day (BID) | INTRAMUSCULAR | Status: DC
  Administered 2018-11-28 (×2): 40 mg via INTRAVENOUS
  Filled 2018-11-28: qty 4

## 2018-11-28 MED ORDER — ACETAMINOPHEN 10 MG/ML IV SOLN
1000.0000 mg | Freq: Four times a day (QID) | INTRAVENOUS | Status: AC
  Administered 2018-11-28 – 2018-11-29 (×4): 1000 mg via INTRAVENOUS
  Filled 2018-11-28 (×4): qty 100

## 2018-11-28 MED ORDER — HALOPERIDOL LACTATE 5 MG/ML IJ SOLN
2.0000 mg | Freq: Four times a day (QID) | INTRAMUSCULAR | Status: DC | PRN
  Administered 2018-11-28 (×2): 2 mg via INTRAVENOUS
  Filled 2018-11-28 (×2): qty 1

## 2018-11-28 MED ORDER — INSULIN GLARGINE 100 UNIT/ML ~~LOC~~ SOLN
50.0000 [IU] | Freq: Two times a day (BID) | SUBCUTANEOUS | Status: DC
  Administered 2018-11-28 (×2): 50 [IU] via SUBCUTANEOUS
  Filled 2018-11-28 (×6): qty 0.5

## 2018-11-28 MED ORDER — LORAZEPAM 2 MG/ML IJ SOLN
0.5000 mg | Freq: Once | INTRAMUSCULAR | Status: AC
  Administered 2018-11-28: 0.5 mg via INTRAVENOUS
  Filled 2018-11-28: qty 1

## 2018-11-28 MED ORDER — SODIUM POLYSTYRENE SULFONATE 15 GM/60ML PO SUSP
60.0000 g | Freq: Once | ORAL | Status: DC
  Filled 2018-11-28: qty 240

## 2018-11-28 NOTE — Progress Notes (Signed)
NAME:  Jesse Wheeler, MRN:  [id number], DOB:  1940/09/01, LOS: 1 ADMISSION DATE:  11/27/2018, CONSULTATION DATE:  11/27/18 REFERRING MD:  Dr. Barry Dienes / [facility] CHIEF COMPLAINT:  Post operative Gastrectomy    Brief History   78 y/o M who presented to [facility] 7/14 for planned distal gastrectomy in the setting of adenocarcinoma.  The patient is followed by the [facility] Cardiology / Pulmonary.  He was successfully extubated post-operatively. PCCM consulted for pulmonary evaluation.   History of present illness   78 y/o M who presented to [facility] 7/14 for planned distal gastrectomy.  He has a PMH of anemia which was evaluated by Dr. 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 EUS 6/4/20showed uT3N0 tumor in the antrum. Colonoscopy was negative at that time.  The patient was cleared per Cardiology & Pulmonary through the [facility] system as "high risk" with recommendations to be extubated to BiPAP.    Past Medical History  MI - s/p stnet  CVA  Chronic Anticoagulation - on Xarelto  RBBB Pulmonary Fibrosis  Former Smoker  OSA  Chronic Renal Insufficiency  HTN  HLD DM II   Significant Hospital Events   7/14 Admit for planned distal gastrectomy, extubated post op  Consults:  PCCM  Cardiology   Procedures:  7/14 Distal Gastrectomy  Significant Diagnostic Tests:  EGD 09/13/18 >> non-bleeding gastric ulcer, irregular mucosa CT Chest / ABD / Pelvis 09/21/18 >> primary gastric mass not appreciated by non-contrast CT, no LAN, multiple small bilateral pulmonary nodules, some are clearly benign / calcified (largest 68mm), mild pulmonary fibrosis with an apical to basal gradient, 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 small nodular density left middle lung. L lung atelectasis. Cardiomegaly.   Micro Data:    Antimicrobials:  7/14 ancef pre-op    Interim history/subjective:  HDS overnight Stable respiratory status  Hyperkalemic overnight, given 1 amp D50, 10units insulin Now POD1 and complaining of nausea   Objective   Blood pressure 137/76, pulse 91, temperature 98.6 F (37 C), temperature source Oral, resp. rate 16, height 5' 9.5" (1.765 m), weight (!) 139.3 kg, SpO2 96 %.    FiO2 (%):  [34 %] 34 %   Intake/Output Summary (Last 24 hours) at 11/28/2018 0956 Last data filed at 11/28/2018 0550 Gross per 24 hour  Intake 2100 ml  Output 1300 ml  Net 800 ml   Filed Weights   11/27/18 1011  Weight: (!) 139.3 kg    Examination: General: Obese, older adult male, uncomfortable appearing, supine in bed  HEENT: NCAT. Patent nares, NGT secure. Nasal cannula displaced from nares. Pink mmm, trachea midline  Neuro: AAOx4 Following commands  CV: s1s2 no rgm Capillary refill < 3 seconds  PULM: Scattered basilar crackles. No wheeze. No accessory muscle recruitment, symmetrical chest expansion  GI: Obese, tender to touch. Midline abdominal incision is well approximated clean dry And dressed without bleeding or drainage  Extremities: Symmetrical bulk and tone no obvious joint deformity. No cyanosis, no clubbing  Skin: Clean, dry, warm, without rash   Resolved Hospital Problem list      Assessment & Plan:   OSA -suspected OHS  -CPAP at home   At risk for atelectasis, hypoxia  P: Recommend minimizing sedation as able, PCA for analgesia  Recommend bed in chair, mobilization, IS/Flutter valve, TCDB for prevention of atelectasis  Continue nocturnal CPAP  COPD Pulmonary Fibrosis Pulmonary nodules P: Duoneb, pulmicort  Outpatient follow up for pulmonary nodules, seen at [place].  SpO2 goal 88-92, adjust supplemental O2 for goal   Adenocarcinoma s/p distal gastrectomy 7/14,  POD1  P: Post-op care pre [facility]  Pain management per primary; from pulmonary standpoint we do recommend minimizing as able   CKD  Hyperkalemia P: Nephrology  has now been consulted and is following  Trend BMP, UOP  Correct electrolyte abnormalities as indicated  Overnight given amp D50 and 10units insulin for K+ 6.8 Avoid nephrotoxic agents, ensure adequate renal perfusion  Hyperglycemia P: SSI  HTN HLD P:  Per primary   Best practice:  Diet: NPO  Pain/Anxiety/Delirium protocol (if indicated): per primary  VAP protocol (if indicated): n/a DVT prophylaxis: SCD's GI prophylaxis: PPI  Glucose control: SSI Mobility: OOB as tolerated  Code Status: Full Code  Family Communication: Per Primary  Disposition: Progressive Care   Labs   CBC: Recent Labs  Lab 11/27/18 1908 11/28/18 0105  WBC 13.8* 12.8*  HGB 13.9 13.2  HCT 42.9 40.5  MCV 96.2 94.8  PLT 169 527    Basic Metabolic Panel: Recent Labs  Lab 11/27/18 1908 11/27/18 2155 11/28/18 0105  NA 133*  --  130*  K 6.3* 6.9* 6.8*  CL 102  --  100  CO2 17*  --  19*  GLUCOSE 308*  --  349*  BUN 23  --  27*  CREATININE 2.25*  --  2.54*  CALCIUM 8.8*  --  8.3*  MG  --   --  1.7  PHOS  --   --  2.3*   GFR: Estimated Creatinine Clearance: 34.6 mL/min (A) (by C-G formula based on SCr of 2.54 mg/dL (H)). Recent Labs  Lab 11/27/18 1908 11/28/18 0105  WBC 13.8* 12.8*    Liver Function Tests: No results for input(s): AST, ALT, ALKPHOS, BILITOT, PROT, ALBUMIN in the last 168 hours. No results for input(s): LIPASE, AMYLASE in the last 168 hours. No results for input(s): AMMONIA in the last 168 hours.  ABG    Component Value Date/Time   TCO2 22 05/01/2018 0736     Coagulation Profile: Recent Labs  Lab 11/27/18 1008  INR 1.1    Cardiac Enzymes: No results for input(s): CKTOTAL, CKMB, CKMBINDEX, TROPONINI in the last 168 hours.  HbA1C: Hgb A1c MFr Bld  Date/Time Value Ref Range Status  11/14/2018 12:08 PM 7.7 (H) 4.8 - 5.6 % Final    Comment:    (NOTE) Pre diabetes:          5.7%-6.4% Diabetes:              >6.4% Glycemic control for   <7.0% adults with  diabetes   12/15/2014 05:50 AM 7.2 (H) 4.8 - 5.6 % Final    Comment:    (NOTE)         Pre-diabetes: 5.7 - 6.4         Diabetes: >6.4         Glycemic control for adults with diabetes: <7.0     CBG: Recent Labs  Lab 11/27/18 2019 11/27/18 2133 11/27/18 2312 11/28/18 0329 11/28/18 0736  GLUCAP 293* 343* 374* 254* [place] MSN, AGACNP-BC [place] [phone] If no answer, [phone] 11/28/2018, 9:56 AM

## 2018-11-28 NOTE — Progress Notes (Signed)
Patient persistently vomiting despite NG suction. Trauma MD contacted, given verbal order to administer 1 ampule of D50 and 10 units of insulin 10 minutes after administering the D50. I was also instructed to carefully pull the NG tube 3cm out.

## 2018-11-28 NOTE — Anesthesia Postprocedure Evaluation (Signed)
Anesthesia Post Note  Patient: Jesse Wheeler  Procedure(s) Performed: LAPAROSCOPY DIAGNOSTIC (N/A Abdomen) DISTAL GASTRECTOMY (N/A Abdomen)     Patient location during evaluation: PACU Anesthesia Type: General Level of consciousness: awake and alert Pain management: pain level not controlled Vital Signs Assessment: post-procedure vital signs reviewed and stable Respiratory status: spontaneous breathing, nonlabored ventilation, respiratory function stable and patient connected to nasal cannula oxygen Cardiovascular status: blood pressure returned to baseline and stable Postop Assessment: no apparent nausea or vomiting Anesthetic complications: no Comments: Difficulty with pain control, but otherwise awake/alert/stable with no apparent anesthetic complications. Recommended starting PCA to assist with pain control    Last Vitals:  Vitals:   11/28/18 1112 11/28/18 1130  BP: 120/82   Pulse: 96   Resp: 20 14  Temp: 36.7 C   SpO2: 95% 95%    Last Pain:  Vitals:   11/28/18 1130  TempSrc:   PainSc: 4                  Lidia Collum

## 2018-11-28 NOTE — Consult Note (Addendum)
[facility]  INPATIENT CONSULTATION  Reason for Consultation: hyperkalemia and AKI onCKD Requesting Provider: Dr. Barry Dienes  HPI: Jesse Wheeler is an 78 y.o. 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  Preop labs 7/1 showed K 4.7, BUN 28, Cr 2.17, eGFR 29.  Post op he's been noted to having hyperkalemia 7p 6.3 > 10p 6.9 > 1am 6.8, Cr 2.54.  He's been treated  with D50 + insulin around 11 pm last night per chart review.  No labs or EKG this AM.  He is NPO currently and on NS 75/hr, lasix 20 IV daily.  I/Os since admission 2.1 / 1.25L.  He is NPO post op until at least tomorrow.  Home ACEi on hold. Post op Hb stable at 13.2.   Currently he denies acute issues except expected post op pain.  No dyspnea, edema.  No flatus yet.  Tells me home dose of lasix recently increased from 20 to 50m for edema.  [place]   PMH: Past Medical History:  Diagnosis Date  . Actinic keratoses 08/09/2013   Overview:  IMPRESSION: Liquid nitrogen x2   . Acute inferior myocardial infarction ([facility]    stenting in 2002 to the RCA  . Anemia   . Bowen's disease   . Cancer ([facility]    recent  dx stomach cancer per pt 11/14/18  . Cataract    bilateral - surgery to remove  . Chronic kidney disease   . Coronary artery disease   . Diabetes mellitus    type II  . Dupuytren's contracture of left hand   . Hearing loss    bilateral - has hearing aids  . Heart murmur   . History of colonic polyps 04/14/2014  . Hypercholesterolemia   . Hyperlipidemia   . Hypertension    LCX  . Obesity   . Onychomycosis   . ONYCHOMYCOSIS 09/07/2006   Qualifier: Diagnosis of  By: ACletus GashMD, [place]   . Panic disorder   . Peyronie's  disease   . Prostatitis, chronic   . Right bundle branch block   . S/P coronary angioplasty   . Sleep apnea    uses cpap nightly  . Stroke ([facility]    takes xarelto  . Tinnitus    PSH: Past Surgical History:  Procedure Laterality Date  . CATARACT EXTRACTION W/ INTRAOCULAR LENS  IMPLANT, BILATERAL    . CHOLECYSTECTOMY    . COLONOSCOPY  03/09/2004   Jesse  . CORONARY ANGIOPLASTY  01/02/01   Acute inferior MI -- Stent placement in the proximal RCA (three separate stents totalling approximately 55 mm of stent with residual thrombus in the posterior descending artery) --  Relatively mild inferior basilar hypokinesia --Severe stenosis in a secondary marginal vessel of the left circumflex -- Minimal coronary atherosclerosis otherwise of the coronary system   . ESOPHAGOGASTRODUODENOSCOPY    . ESOPHAGOGASTRODUODENOSCOPY (EGD) WITH PROPOFOL N/A 10/18/2018   Procedure: ESOPHAGOGASTRODUODENOSCOPY (EGD) WITH PROPOFOL;  Surgeon: JMilus Banister MD;  Location: [facility];  Service: Endoscopy;  Laterality: N/A;  . EUS N/A 10/18/2018   Procedure: UPPER ENDOSCOPIC ULTRASOUND (EUS) RADIAL;  Surgeon: JMilus Banister MD;  Location: [facility];  Service: Endoscopy;  Laterality: N/A;  . EYE SURGERY  Right 04/2018   eye hole repaired  . FASCIECTOMY Right 05/01/2018   Procedure: FASCIECTOMY RIGHT RING FINGER;  Surgeon: Daryll Brod, MD;  Location: [place];  Service: Orthopedics;  Laterality: Right;  . Nuclear stress test  Oct 2011    Dense inferior infarct; mild ischemia with dilated LV. EF was 39%. Study was felt to be unchanged and he has been managed medically  . TRIGGER FINGER RELEASE Right 05/01/2018   Procedure: RELEASE TRIGGER FINGER RIGHT RING FINGER;  Surgeon: Daryll Brod, MD;  Location: [place];  Service: Orthopedics;  Laterality: Right;    Past Medical History:  Diagnosis Date  . Actinic keratoses 08/09/2013   Overview:  IMPRESSION: Liquid nitrogen x2   .  Acute inferior myocardial infarction ([place])    stenting in 2002 to the RCA  . Anemia   . Bowen's disease   . Cancer [facility])    recent  dx stomach cancer per pt 11/14/18  . Cataract    bilateral - surgery to remove  . Chronic kidney disease   . Coronary artery disease   . Diabetes mellitus    type II  . Dupuytren's contracture of left hand   . Hearing loss    bilateral - has hearing aids  . Heart murmur   . History of colonic polyps 04/14/2014  . Hypercholesterolemia   . Hyperlipidemia   . Hypertension    LCX  . Obesity   . Onychomycosis   . ONYCHOMYCOSIS 09/07/2006   Qualifier: Diagnosis of  By: Cletus Gash MD, [place]    . Panic disorder   . Peyronie's disease   . Prostatitis, chronic   . Right bundle branch block   . S/P coronary angioplasty   . Sleep apnea    uses cpap nightly  . Stroke [facility])    takes xarelto  . Tinnitus     Medications:  I have reviewed the patient's current medications. NS 75/hr, lovenox 40, lasix 20 IV daily, lantus 50 BID, SSI, metprolol IV 7.5 Q6h, protonix 40 daily  Medications Prior to Admission  Medication Sig Dispense Refill  . aspirin EC 81 MG tablet Take 81 mg by mouth.    . doxazosin (CARDURA) 8 MG tablet Take 8 mg by mouth daily.     . ERGOCALCIFEROL PO Take 8,000 Units by mouth every 30 (thirty) days.     Marland Kitchen ezetimibe (ZETIA) 10 MG tablet Take 10 mg by mouth daily.     . ferrous sulfate 325 (65 FE) MG tablet Take 325 mg by mouth daily with breakfast.    . furosemide (LASIX) 20 MG tablet Take 20 mg by mouth daily.     Marland Kitchen gabapentin (NEURONTIN) 300 MG capsule Take 900 mg by mouth 3 (three) times daily.     . insulin aspart (NOVOLOG) 100 UNIT/ML injection Inject 8-40 Units into the skin 3 (three) times daily with meals.     . insulin glargine (LANTUS) 100 UNIT/ML injection Inject 60-65 Units into the skin See admin instructions. 65 units in the morning, 60 units at bedtime    . isosorbide mononitrate (IMDUR) 30 MG 24 hr tablet TAKE 2 TABLETS PO  DAILY (Patient taking differently: Take 60 mg by mouth daily. ) 60 tablet 9  . lisinopril (ZESTRIL) 40 MG tablet Take 20 mg by mouth daily.    . metoprolol succinate (TOPROL-XL) 25 MG 24 hr tablet Take 12.5 mg by mouth daily.    . nitroGLYCERIN (NITROSTAT) 0.4 MG SL tablet Place  1 tablet (0.4 mg total) under the tongue every 5 (five) minutes as needed. 25 tablet 1  . omeprazole (PRILOSEC) 40 MG capsule Take 1 capsule (40 mg total) by mouth daily. 90 capsule 3  . Semaglutide (OZEMPIC) 1 MG/DOSE SOPN Inject 1 mg into the skin every Friday.     . senna-docusate (SENOKOT-S) 8.6-50 MG tablet Take 1 tablet by mouth daily.     . simvastatin (ZOCOR) 40 MG tablet Take 40 mg by mouth daily.    . temazepam (RESTORIL) 15 MG capsule Take 15 mg by mouth at bedtime as needed for sleep.    . Blood Glucose Monitoring Suppl (FIFTY50 GLUCOSE METER 2.0) W/DEVICE KIT by Does not apply route.    Marland Kitchen lisinopril (PRINIVIL,ZESTRIL) 20 MG tablet Take 1 tablet (20 mg total) by mouth daily. (Patient not taking: Reported on 11/09/2018) 30 tablet 6  . ONETOUCH VERIO test strip   1  . Rivaroxaban (XARELTO) 15 MG TABS tablet Take 1 tablet by mouth once daily with a meal (Patient taking differently: Take 15 mg by mouth daily with supper. Take 1 tablet by mouth once daily with a meal) 90 tablet 3    ALLERGIES:  No Known Allergies  FAM HX: Family History  Problem Relation Age of Onset  . Heart attack Father   . Heart disease Father   . Diabetes Sister   . Transient ischemic attack Sister   . Colon cancer Neg Hx     Social History:   reports that he quit smoking about 37 years ago. His smoking use included cigarettes. He has a 20.00 pack-year smoking history. He has never used smokeless tobacco. He reports that he does not drink alcohol or use drugs.  ROS: 12 system ROS per HPI above  Blood pressure 137/76, pulse 91, temperature 98.6 F (37 C), temperature source Oral, resp. rate 16, height 5' 9.5" (1.765 m), weight (!)  139.3 kg, SpO2 96 %. PHYSICAL EXAM: Gen: obese elderly man lying comfortable  Eyes:  anicteric ENT: MMM, NG on suction Neck: supple CV: RRR, no rub Abd: absent BS, midline incision, TTP Lungs: Jesse WOB, clear ant GU: foley with clear yellow urine Extr:  No edema Neuro: nonfocal Skin: no rashes, lesions   Results for orders placed or performed during the hospital encounter of 11/27/18 (from the past 48 hour(s))  PT- INR Day of Surgery     Status: None   Collection Time: 11/27/18 10:08 AM  Result Value Ref Range   Prothrombin Time 14.2 11.4 - 15.2 seconds   INR 1.1 0.8 - 1.2    Comment: (NOTE) INR goal varies based on device and disease states. Performed at [facility], [place] [address] [place],  [address]   Glucose, capillary     Status: Abnormal   Collection Time: 11/27/18 10:12 AM  Result Value Ref Range   Glucose-Capillary 213 (H) 70 - 99 mg/dL  Glucose, capillary     Status: Abnormal   Collection Time: 11/27/18  2:59 PM  Result Value Ref Range   Glucose-Capillary 197 (H) 70 - 99 mg/dL   Comment 1 Notify RN    Comment 2 Document in Chart   Glucose, capillary     Status: Abnormal   Collection Time: 11/27/18  6:03 PM  Result Value Ref Range   Glucose-Capillary 262 (H) 70 - 99 mg/dL  CBC     Status: Abnormal   Collection Time: 11/27/18  7:08 PM  Result Value Ref Range  WBC 13.8 (H) 4.0 - 10.5 K/uL   RBC 4.46 4.22 - 5.81 MIL/uL   Hemoglobin 13.9 13.0 - 17.0 g/dL   HCT 42.9 39.0 - 52.0 %   MCV 96.2 80.0 - 100.0 fL   MCH 31.2 26.0 - 34.0 pg   MCHC 32.4 30.0 - 36.0 g/dL   RDW 14.3 11.5 - 15.5 %   Platelets 169 150 - 400 K/uL   nRBC 0.0 0.0 - 0.2 %    Comment: Performed at [facility], [place] [address] [place], [place] [address]  Basic metabolic panel     Status: Abnormal   Collection Time: 11/27/18  7:08 PM  Result Value Ref Range   Sodium 133 (L) 135 - 145 mmol/L   Potassium 6.3 (HH) 3.5 - 5.1 mmol/L    Comment: NO VISIBLE  HEMOLYSIS CRITICAL RESULT CALLED TO, READ BACK BY AND VERIFIED WITH: Angelica Ran RN AT 1951 ON [id number] BY K FORSYTH    Chloride 102 98 - 111 mmol/L   CO2 17 (L) 22 - 32 mmol/L   Glucose, Bld 308 (H) 70 - 99 mg/dL   BUN 23 8 - 23 mg/dL   Creatinine, Ser 2.25 (H) 0.61 - 1.24 mg/dL   Calcium 8.8 (L) 8.9 - 10.3 mg/dL   GFR calc non Af Amer 27 (L) >60 mL/min   GFR calc Af Amer 32 (L) >60 mL/min   Anion gap 14 5 - 15    Comment: Performed at [facility] [address], [place], [place] [address]  Glucose, capillary     Status: Abnormal   Collection Time: 11/27/18  8:19 PM  Result Value Ref Range   Glucose-Capillary 293 (H) 70 - 99 mg/dL  Glucose, capillary     Status: Abnormal   Collection Time: 11/27/18  9:33 PM  Result Value Ref Range   Glucose-Capillary 343 (H) 70 - 99 mg/dL  Potassium     Status: Abnormal   Collection Time: 11/27/18  9:55 PM  Result Value Ref Range   Potassium 6.9 (HH) 3.5 - 5.1 mmol/L    Comment: NO VISIBLE HEMOLYSIS CRITICAL RESULT CALLED TO, READ BACK BY AND VERIFIED WITH: PEICKERT [facility] 11/27/18 2229 WAYK Performed at [facility], [place] [address], [place], [place] [address]   Glucose, capillary     Status: Abnormal   Collection Time: 11/27/18 11:12 PM  Result Value Ref Range   Glucose-Capillary 374 (H) 70 - 99 mg/dL  CBC     Status: Abnormal   Collection Time: 11/28/18  1:05 AM  Result Value Ref Range   WBC 12.8 (H) 4.0 - 10.5 K/uL   RBC 4.27 4.22 - 5.81 MIL/uL   Hemoglobin 13.2 13.0 - 17.0 g/dL   HCT 40.5 39.0 - 52.0 %   MCV 94.8 80.0 - 100.0 fL   MCH 30.9 26.0 - 34.0 pg   MCHC 32.6 30.0 - 36.0 g/dL   RDW 14.4 11.5 - 15.5 %   Platelets 164 150 - 400 K/uL   nRBC 0.0 0.0 - 0.2 %    Comment: Performed at [facility], [place] [address], [place], [place] [address]  Basic metabolic panel     Status: Abnormal   Collection Time: 11/28/18  1:05 AM  Result Value Ref Range   Sodium 130 (L) 135 - 145 mmol/L   Potassium 6.8 (HH) 3.5 - 5.1  mmol/L    Comment: NO VISIBLE HEMOLYSIS CRITICAL RESULT CALLED TO, READ BACK BY AND  VERIFIED WITH: J.WEAVER,RN 0205 11/28/18 G.MCADOO    Chloride 100 98 - 111 mmol/L   CO2 19 (L) 22 - 32 mmol/L   Glucose, Bld 349 (H) 70 - 99 mg/dL   BUN 27 (H) 8 - 23 mg/dL   Creatinine, Ser 2.54 (H) 0.61 - 1.24 mg/dL   Calcium 8.3 (L) 8.9 - 10.3 mg/dL   GFR calc non Af Amer 24 (L) >60 mL/min   GFR calc Af Amer 27 (L) >60 mL/min   Anion gap 11 5 - 15    Comment: Performed at [facility] [address] [place], [place] [address]  Magnesium     Status: None   Collection Time: 11/28/18  1:05 AM  Result Value Ref Range   Magnesium 1.7 1.7 - 2.4 mg/dL    Comment: Performed at [facility] [address], [place], [place] [address]  Phosphorus     Status: Abnormal   Collection Time: 11/28/18  1:05 AM  Result Value Ref Range   Phosphorus 2.3 (L) 2.5 - 4.6 mg/dL    Comment: Performed at [facility] [address] [place], [place] [address]  Glucose, capillary     Status: Abnormal   Collection Time: 11/28/18  3:29 AM  Result Value Ref Range   Glucose-Capillary 254 (H) 70 - 99 mg/dL  Glucose, capillary     Status: Abnormal   Collection Time: 11/28/18  7:36 AM  Result Value Ref Range   Glucose-Capillary 167 (H) 70 - 99 mg/dL    Dg Chest Port 1 View  Result Date: 11/27/2018 CLINICAL DATA:  Hypoxia. EXAM: PORTABLE CHEST 1 VIEW COMPARISON:  09/01/2017.  Chest CT dated 09/21/2018. FINDINGS: Stable enlarged cardiac silhouette. Interval linear density in the medial aspects of the left upper and lower lung zones and small nodular density in the left mid lung zone. Interval minimal patchy density at the left lateral lung base. Clear right lung. Unremarkable bones. Nasogastric tube extending into the stomach. IMPRESSION: 1. Small nodular density in the left mid lung zone. This may represent a high positioned left nipple shadow based on the previous CT no lung nodule seen at that location at  that time. This could be confirmed with a repeat frontal view with nipple markers. 2. Minimal patchy atelectasis or pneumonia at the left lateral lung base. 3. Interval subsegmental atelectasis in the left upper and lower lung zones medially. 4. Stable cardiomegaly. Electronically Signed   By: Claudie Revering M.D.   On: 11/27/2018 16:43   Dg Abd Portable 1v  Result Date: 11/27/2018 CLINICAL DATA:  Encounter for nasogastric (NG) tube placement EXAM: PORTABLE ABDOMEN - 1 VIEW COMPARISON:  Chest radiograph same day FINDINGS: NG tube extends through the GE junction and stomach into the LEFT lower quadrant. Tube presumably extends through a gastrojejunostomy. Tube tip terminates in the LEFT lower quadrant over the iliac crest. Midline surgical staples noted. IMPRESSION: 1. NG tube extends into stomach and presumably through a gastrojejunostomy into the LEFT lower quadrant. 2. Midline surgical staples noted. Electronically Signed   By: Suzy Bouchard M.D.   On: 11/27/2018 20:19    Assessment/Plan **Hyperkalemia:  Likely multifactorial with AKI on CKD, tissue damage with surgery (even minor).  Not on any K supplements. Quite high and will require acute management.  Given NPO status limited in preferred option for management.  --Check BMP and EKG now --> if EKG changes (EKG preop yesterday with bifasicular block) or higher yet will need acute  management with ca, insulin, dextrose --Check CK --Cont NS but increase to 138m/hr - watch vol status closely with EF 40% + Increase lasix to 40 BID to promote kaliuresis --Follow I/Os closely to maintain euvolemia --Kayexelate 60g retention enema now - discussed with surgery, ok --Doubt HD will be necessary but if not improving with above measures would offer  **AKI on CKD: mild AKI on CKD post op - should be reversible and likely reversible. Review of anesthesia records shows MAPs remained 655mg + throughout.  Check UA, follow I/Os.  Should this worsen would consider  further w/u but I think for now it's not needed.  Hold ACEi for now.   **HTN:  Being managed by Cardiology = on IV meds while NPO. Once po meds acceptable would continue to hold ACEi until K and Cr improved.   **COPD: pulmonary following, no acute issue  **Gastric antral adenoca: POD 1 laparoscopic distal gastrectomy with retrocolic Billroth II anastamosis.  No immediate complications noted.  NPO until at least tomorrow.   **DM:  Hyperglycemic post op, insulin per primary.  If need to use IV insulin to manage hyperkalemia may not need or need lower dose D50 than usual.   ADDENDUM:  EKG done bedside while I was present, no changes compared to yesterday.  Await labs.   ADDENDUM 2, 12:09p: f/u K 5.0.  Discontinue kayexelate enema.  Change lasix dose to 4066maily - can give another dose PRN.  Has f/u BMP at 1600 as well.    LinJustin Mend15/2020, 9:30 AM

## 2018-11-28 NOTE — Progress Notes (Signed)
Progress Note  Patient Name: Jesse Wheeler Date of Encounter: 11/28/2018  Primary Cardiologist: Minus Breeding, MD   Subjective   No dyspnea or CP; abdominal pain  Inpatient Medications    Scheduled Meds: . budesonide (PULMICORT) nebulizer solution  0.5 mg Nebulization BID  . enoxaparin (LOVENOX) injection  40 mg Subcutaneous Q24H  . furosemide  20 mg Intravenous Daily  . HYDROmorphone   Intravenous Q4H  . insulin aspart  0-20 Units Subcutaneous Q4H  . insulin glargine  50 Units Subcutaneous BID  . ipratropium-albuterol  3 mL Nebulization TID  . metoprolol tartrate  7.5 mg Intravenous Q6H  . pantoprazole (PROTONIX) IV  40 mg Intravenous QHS  . sodium polystyrene  60 g Rectal Once   Continuous Infusions: . sodium chloride 75 mL/hr at 11/28/18 0600  . acetaminophen 1,000 mg (11/28/18 0622)  . bupivacaine ON-Q pain pump    . methocarbamol (ROBAXIN) IV 500 mg (11/27/18 1618)   PRN Meds: acetaminophen **OR** acetaminophen, diphenhydrAMINE **OR** diphenhydrAMINE, hydrALAZINE, methocarbamol (ROBAXIN) IV, naloxone **AND** sodium chloride flush, nitroGLYCERIN, ondansetron (ZOFRAN) IV, ondansetron **OR** [DISCONTINUED] ondansetron (ZOFRAN) IV   Vital Signs    Vitals:   11/28/18 0432 11/28/18 0737 11/28/18 0818 11/28/18 0858  BP:  137/76    Pulse:  91    Resp: 20 18  16   Temp:  98.6 F (37 C)    TempSrc:  Oral    SpO2: 96% 96% 99% 96%  Weight:      Height:        Intake/Output Summary (Last 24 hours) at 11/28/2018 1003 Last data filed at 11/28/2018 0550 Gross per 24 hour  Intake 2100 ml  Output 1300 ml  Net 800 ml   Last 3 Weights 11/27/2018 11/14/2018 10/18/2018  Weight (lbs) 307 lb 307 lb 1 oz 300 lb  Weight (kg) 139.254 kg 139.283 kg 136.079 kg      Telemetry    Sinus- Personally Reviewed   Physical Exam   GEN: No acute distress.   Neck: No JVD Cardiac: RRR, no murmurs, rubs, or gallops.  Respiratory: Clear to auscultation bilaterally. GI: S/P abd surgery;  dressing in place; tender MS: No edema Neuro:  Nonfocal  Psych: Normal affect   Labs     Chemistry Recent Labs  Lab 11/27/18 1908 11/27/18 2155 11/28/18 0105  NA 133*  --  130*  K 6.3* 6.9* 6.8*  CL 102  --  100  CO2 17*  --  19*  GLUCOSE 308*  --  349*  BUN 23  --  27*  CREATININE 2.25*  --  2.54*  CALCIUM 8.8*  --  8.3*  GFRNONAA 27*  --  24*  GFRAA 32*  --  27*  ANIONGAP 14  --  11     Hematology Recent Labs  Lab 11/27/18 1908 11/28/18 0105  WBC 13.8* 12.8*  RBC 4.46 4.27  HGB 13.9 13.2  HCT 42.9 40.5  MCV 96.2 94.8  MCH 31.2 30.9  MCHC 32.4 32.6  RDW 14.3 14.4  PLT 169 164    Radiology    Dg Chest Port 1 View  Result Date: 11/27/2018 CLINICAL DATA:  Hypoxia. EXAM: PORTABLE CHEST 1 VIEW COMPARISON:  09/01/2017.  Chest CT dated 09/21/2018. FINDINGS: Stable enlarged cardiac silhouette. Interval linear density in the medial aspects of the left upper and lower lung zones and small nodular density in the left mid lung zone. Interval minimal patchy density at the left lateral lung base. Clear right lung. Unremarkable  bones. Nasogastric tube extending into the stomach. IMPRESSION: 1. Small nodular density in the left mid lung zone. This may represent a high positioned left nipple shadow based on the previous CT no lung nodule seen at that location at that time. This could be confirmed with a repeat frontal view with nipple markers. 2. Minimal patchy atelectasis or pneumonia at the left lateral lung base. 3. Interval subsegmental atelectasis in the left upper and lower lung zones medially. 4. Stable cardiomegaly. Electronically Signed   By: Claudie Revering M.D.   On: 11/27/2018 16:43   Dg Abd Portable 1v  Result Date: 11/27/2018 CLINICAL DATA:  Encounter for nasogastric (NG) tube placement EXAM: PORTABLE ABDOMEN - 1 VIEW COMPARISON:  Chest radiograph same day FINDINGS: NG tube extends through the GE junction and stomach into the LEFT lower quadrant. Tube presumably extends  through a gastrojejunostomy. Tube tip terminates in the LEFT lower quadrant over the iliac crest. Midline surgical staples noted. IMPRESSION: 1. NG tube extends into stomach and presumably through a gastrojejunostomy into the LEFT lower quadrant. 2. Midline surgical staples noted. Electronically Signed   By: Suzy Bouchard M.D.   On: 11/27/2018 20:19    Patient Profile     78 year old male with past medical history of paroxysmal atrial fibrillation, coronary artery disease with prior PCI, hyperlipidemia, hypertension, prior TIA, ischemic cardiomyopathy with ejection fraction 40 to 45%, obstructive sleep apnea and now status post resection of gastric adenocarcinoma for evaluation of hypertension and coronary disease.  Patient is status post resection of gastric adenocarcinoma. Cardiology asked to assist with management of blood pressure and cardiac medications.  Assessment & Plan    1 paroxysmal atrial fibrillation-patient remains in sinus rhythm today.  He will be at increased risk for postoperative atrial fibrillation.  He is presently n.p.o.  We will continue with IV metoprolol.  Anticoagulation on hold given recent surgery but will resume postoperatively when okay with general surgery. CHADSvasc 7.   2 coronary artery disease-we will resume statin when able.  No ASA given need for chronic anticoagulation.   3 hypertension-continue IV metoprolol; continue hydralazine as needed. Once he is able to take oral medications we will resume lisinopril (assuming renal function stable) and isosorbide.  4 chronic stage III kidney disease-renal function worse today; follow  5 history of chronic diastolic congestive heart failure-Lasix on hold as pt NPO. Follow closely for volume excess.  6 ischemic cardiomyopathy-we will resume ACE inhibitor when able postoperatively.  Continue IV Lopressor and change back to Toprol when tolerating.  7 status post resection of gastric adenocarcinoma-Per general  surgery.  8 Hyperkalemia-CCM following and has administered Kayexalate.  For questions or updates, please contact [place] Please consult [email or web address] for contact info under        Signed, Kirk Ruths, MD  11/28/2018, 10:03 AM

## 2018-11-28 NOTE — Progress Notes (Signed)
Patient pulled ng tube out around 1330.  Dr. Barry Dienes paged.  Verbal order to leave ng tube out.  RN to continue to monitor.

## 2018-11-28 NOTE — Progress Notes (Signed)
1 Day Post-Op   Subjective/Chief Complaint: Had some nausea last night.  Also has hyperkalemia.     Objective: Vital signs in last 24 hours: Temp:  [97.2 F (36.2 C)-98.6 F (37 C)] 98.6 F (37 C) (07/15 0737) Pulse Rate:  [91-107] 91 (07/15 0737) Resp:  [17-23] 18 (07/15 0737) BP: (117-159)/(68-100) 137/76 (07/15 0737) SpO2:  [92 %-99 %] 99 % (07/15 0818) Arterial Line BP: (129-173)/(64-73) 160/64 (07/14 1645) Weight:  [139.3 kg] 139.3 kg (07/14 1011)    Intake/Output from previous day: 07/14 0701 - 07/15 0700 In: 2100 [I.V.:2000; IV Piggyback:100] Out: 1300 [Urine:1250; Blood:50] Intake/Output this shift: No intake/output data recorded.  General appearance: alert, cooperative and mild distress Resp: breathing comfortably Cardio: regular rate and rhythm GI: soft, protuberant as baseline.  dressing c/d/i.  bilious output in NGT.   Extremities: extremities normal, atraumatic, no cyanosis or edema  Lab Results:  Recent Labs    11/27/18 1908 11/28/18 0105  WBC 13.8* 12.8*  HGB 13.9 13.2  HCT 42.9 40.5  PLT 169 164   BMET Recent Labs    11/27/18 1908 11/27/18 2155 11/28/18 0105  NA 133*  --  130*  K 6.3* 6.9* 6.8*  CL 102  --  100  CO2 17*  --  19*  GLUCOSE 308*  --  349*  BUN 23  --  27*  CREATININE 2.25*  --  2.54*  CALCIUM 8.8*  --  8.3*   PT/INR Recent Labs    11/27/18 1008  LABPROT 14.2  INR 1.1   ABG No results for input(s): PHART, HCO3 in the last 72 hours.  Invalid input(s): PCO2, PO2  Studies/Results: Dg Chest Port 1 View  Result Date: 11/27/2018 CLINICAL DATA:  Hypoxia. EXAM: PORTABLE CHEST 1 VIEW COMPARISON:  09/01/2017.  Chest CT dated 09/21/2018. FINDINGS: Stable enlarged cardiac silhouette. Interval linear density in the medial aspects of the left upper and lower lung zones and small nodular density in the left mid lung zone. Interval minimal patchy density at the left lateral lung base. Clear right lung. Unremarkable bones.  Nasogastric tube extending into the stomach. IMPRESSION: 1. Small nodular density in the left mid lung zone. This may represent a high positioned left nipple shadow based on the previous CT no lung nodule seen at that location at that time. This could be confirmed with a repeat frontal view with nipple markers. 2. Minimal patchy atelectasis or pneumonia at the left lateral lung base. 3. Interval subsegmental atelectasis in the left upper and lower lung zones medially. 4. Stable cardiomegaly. Electronically Signed   By: Claudie Revering M.D.   On: 11/27/2018 16:43   Dg Abd Portable 1v  Result Date: 11/27/2018 CLINICAL DATA:  Encounter for nasogastric (NG) tube placement EXAM: PORTABLE ABDOMEN - 1 VIEW COMPARISON:  Chest radiograph same day FINDINGS: NG tube extends through the GE junction and stomach into the LEFT lower quadrant. Tube presumably extends through a gastrojejunostomy. Tube tip terminates in the LEFT lower quadrant over the iliac crest. Midline surgical staples noted. IMPRESSION: 1. NG tube extends into stomach and presumably through a gastrojejunostomy into the LEFT lower quadrant. 2. Midline surgical staples noted. Electronically Signed   By: Suzy Bouchard M.D.   On: 11/27/2018 20:19    Anti-infectives: Anti-infectives (From admission, onward)   Start     Dose/Rate Route Frequency Ordered Stop   11/27/18 2000  ceFAZolin (ANCEF) IVPB 2g/100 mL premix     2 g 200 mL/hr over 30 Minutes Intravenous Every  8 hours 11/27/18 1732 11/27/18 2030   11/27/18 0600  ceFAZolin (ANCEF) 3 g in dextrose 5 % 50 mL IVPB     3 g 100 mL/hr over 30 Minutes Intravenous On call to O.R. 11/26/18 0806 11/27/18 1228      Assessment/Plan: s/p Procedure(s): LAPAROSCOPY DIAGNOSTIC (N/A) DISTAL GASTRECTOMY (N/A) CKD/-hyperkalemia- nephrology consult A fib /HTN- appreciate cardiology input- will plan restart anticoagulation tomorrow (will start with heparin) Gastric cancer - await pathology Pulmonary  fibrosis/Sleep apnea. - pulm consult.  Try to minimize narcotics, pulmonary toilet. Pain control- OnQ and low dose PCA, tylenol  Anticipate sips tomorrow.  Hope to d/c NGT tomorrow or the next day depending on nausea.     LOS: 1 day    Jesse Wheeler 11/28/2018

## 2018-11-28 NOTE — Evaluation (Addendum)
Physical Therapy Evaluation Patient Details Name: Jesse Wheeler MRN: [id number] DOB: 1940-08-30 Today's Date: 11/28/2018   History of Present Illness  Pt is a 78 y.o. 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   Clinical Impression  Pt admitted with above diagnosis. Pt currently with functional limitations due to the deficits listed below (see PT Problem List). Pt with no pain at rest, increased abdominal pain with movement. He is confused and pulling out lines prior to evaluation. Ambulating 180 feet with walker and min guard assist. HR up to 122 bpm. Don't anticipate need for follow up PT, will continue to mobilize acutely.      Follow Up Recommendations No PT follow up    Equipment Recommendations  Rolling walker with 5" wheels    Recommendations for Other Services       Precautions / Restrictions Precautions Precautions: Other (comment) Precaution Comments: PCA Restrictions Weight Bearing Restrictions: No      Mobility  Bed Mobility Overal bed mobility: Needs Assistance Bed Mobility: Supine to Sit     Supine to sit: Min assist     General bed mobility comments: Min assist to pull up to sit, limited by pain  Transfers Overall transfer level: Needs assistance Equipment used: Rolling walker (2 wheeled) Transfers: Sit to/from Stand Sit to Stand: Min guard            Ambulation/Gait Ambulation/Gait assistance: Min guard Gait Distance (Feet): 180 Feet Assistive device: Rolling walker (2 wheeled) Gait Pattern/deviations: Step-through pattern;Trunk flexed     General Gait Details: cues for upright, no overt LOB  Stairs            Wheelchair Mobility    Modified Rankin (Stroke Patients Only)       Balance Overall balance assessment: Mild deficits observed, not formally tested                                            Pertinent Vitals/Pain Pain Assessment: Faces Faces Pain Scale: Hurts even more Pain Location: abdomen with movement Pain Descriptors / Indicators: Grimacing;Guarding Pain Intervention(s): Monitored during session    Home Living Family/patient expects to be discharged to:: Private residence Living Arrangements: Spouse/significant other Available Help at Discharge: Family Type of Home: House Home Access: Stairs to enter   Technical brewer of Steps: 4 Home Layout: One level Home Equipment: None      Prior Function Level of Independence: Independent               Hand Dominance        Extremity/Trunk Assessment   Upper Extremity Assessment Upper Extremity Assessment: Overall WFL for tasks assessed    Lower Extremity Assessment Lower Extremity Assessment: Overall WFL for tasks assessed       Communication   Communication: No difficulties  Cognition Arousal/Alertness: Awake/alert Behavior During Therapy: WFL for tasks assessed/performed Overall Cognitive Status: Impaired/Different from baseline Area of Impairment: Orientation;Safety/judgement                 Orientation Level: Disoriented to;Time       Safety/Judgement: Decreased awareness of safety     General Comments: Pt pulling out lines, NG tube. States the month is August      General Comments  Exercises     Assessment/Plan    PT Assessment Patient needs continued PT services  PT Problem List Decreased activity tolerance;Decreased mobility;Pain       PT Treatment Interventions DME instruction;Gait training;Stair training;Functional mobility training;Therapeutic activities;Therapeutic exercise;Balance training;Patient/family education    PT Goals (Current goals can be found in the Care Plan section)  Acute Rehab PT Goals Patient Stated Goal: "work in my garage." PT Goal Formulation: With patient Time For Goal Achievement:  12/12/18 Potential to Achieve Goals: Good    Frequency Min 3X/week   Barriers to discharge        Co-evaluation               AM-PAC PT "6 Clicks" Mobility  Outcome Measure Help needed turning from your back to your side while in a flat bed without using bedrails?: None Help needed moving from lying on your back to sitting on the side of a flat bed without using bedrails?: A Little Help needed moving to and from a bed to a chair (including a wheelchair)?: None Help needed standing up from a chair using your arms (e.g., wheelchair or bedside chair)?: None Help needed to walk in hospital room?: A Little Help needed climbing 3-5 steps with a railing? : A Little 6 Click Score: 21    End of Session Equipment Utilized During Treatment: Gait belt Activity Tolerance: Patient tolerated treatment well Patient left: in chair;with call bell/phone within reach;with chair alarm set Nurse Communication: Mobility status PT Visit Diagnosis: Pain;Difficulty in walking, not elsewhere classified (R26.2) Pain - part of body: (abdomen)    Time: 8938-1017 PT Time Calculation (min) (ACUTE ONLY): 35 min   Charges:   PT Evaluation $PT Eval Moderate Complexity: 1 Mod PT Treatments $Gait Training: 8-22 mins        Ellamae Sia, PT, DPT Acute Rehabilitation Services Pager [phone] Office [phone]   Willy Eddy 11/28/2018, 4:55 PM

## 2018-11-28 NOTE — Progress Notes (Signed)
1500:  Patient pulled out pain pump and abdomen dressing.  Patient stated "trying to fix the lines."  New honeycomb dressing applied and general surgery paged.  New verbal orders received.  RN to continue to monitor.

## 2018-11-29 LAB — CBC
HCT: 38.6 % — ABNORMAL LOW (ref 39.0–52.0)
Hemoglobin: 12.3 g/dL — ABNORMAL LOW (ref 13.0–17.0)
MCH: 30.9 pg (ref 26.0–34.0)
MCHC: 31.9 g/dL (ref 30.0–36.0)
MCV: 97 fL (ref 80.0–100.0)
Platelets: 157 10*3/uL (ref 150–400)
RBC: 3.98 MIL/uL — ABNORMAL LOW (ref 4.22–5.81)
RDW: 14.6 % (ref 11.5–15.5)
WBC: 13 10*3/uL — ABNORMAL HIGH (ref 4.0–10.5)
nRBC: 0 % (ref 0.0–0.2)

## 2018-11-29 LAB — BASIC METABOLIC PANEL
Anion gap: 11 (ref 5–15)
BUN: 31 mg/dL — ABNORMAL HIGH (ref 8–23)
CO2: 24 mmol/L (ref 22–32)
Calcium: 8.4 mg/dL — ABNORMAL LOW (ref 8.9–10.3)
Chloride: 101 mmol/L (ref 98–111)
Creatinine, Ser: 2.49 mg/dL — ABNORMAL HIGH (ref 0.61–1.24)
GFR calc Af Amer: 28 mL/min — ABNORMAL LOW (ref >60)
GFR calc non Af Amer: 24 mL/min — ABNORMAL LOW (ref >60)
Glucose, Bld: 83 mg/dL (ref 70–99)
Potassium: 4.1 mmol/L (ref 3.5–5.1)
Sodium: 136 mmol/L (ref 135–145)

## 2018-11-29 LAB — GLUCOSE, CAPILLARY
Glucose-Capillary: 107 mg/dL — ABNORMAL HIGH (ref 70–99)
Glucose-Capillary: 116 mg/dL — ABNORMAL HIGH (ref 70–99)
Glucose-Capillary: 116 mg/dL — ABNORMAL HIGH (ref 70–99)
Glucose-Capillary: 79 mg/dL (ref 70–99)
Glucose-Capillary: 81 mg/dL (ref 70–99)
Glucose-Capillary: 82 mg/dL (ref 70–99)

## 2018-11-29 LAB — APTT: aPTT: 39 seconds — ABNORMAL HIGH (ref 24–36)

## 2018-11-29 MED ORDER — SODIUM CHLORIDE 0.9 % IV BOLUS
1000.0000 mL | Freq: Once | INTRAVENOUS | Status: AC
  Administered 2018-11-29: 1000 mL via INTRAVENOUS

## 2018-11-29 MED ORDER — HEPARIN (PORCINE) 25000 UT/250ML-% IV SOLN
2000.0000 [IU]/h | INTRAVENOUS | Status: DC
  Administered 2018-11-29: 1600 [IU]/h via INTRAVENOUS
  Administered 2018-12-02 – 2018-12-03 (×3): 2000 [IU]/h via INTRAVENOUS
  Filled 2018-11-29 (×9): qty 250

## 2018-11-29 MED ORDER — TRAMADOL HCL 50 MG PO TABS: 50.0000 mg | ORAL_TABLET | Freq: Four times a day (QID) | ORAL | Status: DC | PRN

## 2018-11-29 NOTE — Progress Notes (Signed)
Pt. Has home cpap. Places on himself when ready.

## 2018-11-29 NOTE — Progress Notes (Signed)
Pt, restless, agitated , pulling on lines and tubes, combative with staff. PRN haldol given with no success. Benadryl and zofran given for itching and nausea. Pt remain agitated, attempting to get out of bed, pulling on iv lines,removed C-pap. Md notified via telephone order for 0.5mg  ativan x 1 , order initiated with satisfactory success, spouse notified permission granted for spouse to remain at patient's bedside. Resting quietly in bed with eyes closed at this time, no visible sign of distress noted at this time.

## 2018-11-29 NOTE — Progress Notes (Signed)
Progress Note  Patient Name: Jesse Wheeler Date of Encounter: 11/29/2018  Primary Cardiologist: Minus Breeding, MD   Subjective   No dyspnea or CP; continues with abdominal pain  Inpatient Medications    Scheduled Meds: . budesonide (PULMICORT) nebulizer solution  0.5 mg Nebulization BID  . enoxaparin (LOVENOX) injection  40 mg Subcutaneous Q24H  . HYDROmorphone   Intravenous Q4H  . insulin aspart  0-20 Units Subcutaneous Q4H  . insulin glargine  50 Units Subcutaneous BID  . ipratropium-albuterol  3 mL Nebulization TID  . metoprolol tartrate  7.5 mg Intravenous Q6H  . pantoprazole (PROTONIX) IV  40 mg Intravenous QHS   Continuous Infusions: . sodium chloride 125 mL/hr at 11/29/18 0530  . acetaminophen 1,000 mg (11/29/18 0606)  . bupivacaine ON-Q pain pump    . methocarbamol (ROBAXIN) IV 500 mg (11/27/18 1618)  . sodium chloride     PRN Meds: acetaminophen **OR** acetaminophen, diphenhydrAMINE **OR** diphenhydrAMINE, haloperidol lactate, hydrALAZINE, methocarbamol (ROBAXIN) IV, naloxone **AND** sodium chloride flush, nitroGLYCERIN, ondansetron (ZOFRAN) IV, ondansetron **OR** [DISCONTINUED] ondansetron (ZOFRAN) IV   Vital Signs    Vitals:   11/29/18 0457 11/29/18 0700 11/29/18 0808 11/29/18 0826  BP:   137/86   Pulse:  93 98   Resp:  (!) 27 (!) 23 19  Temp:   98.5 F (36.9 C)   TempSrc:   Oral   SpO2:  95% 91% 96%  Weight: 136 kg     Height:        Intake/Output Summary (Last 24 hours) at 11/29/2018 0850 Last data filed at 11/29/2018 0800 Gross per 24 hour  Intake 2029.17 ml  Output 5000 ml  Net -2970.83 ml   Last 3 Weights 11/29/2018 11/27/2018 11/14/2018  Weight (lbs) 299 lb 13.2 oz 307 lb 307 lb 1 oz  Weight (kg) 136 kg 139.254 kg 139.283 kg      Telemetry    Sinus with PACs, PVCs and NSVT- Personally Reviewed   Physical Exam   GEN: No acute distress.  WD Neck: supple Cardiac: RRR Respiratory: Clear to auscultation bilaterally; no wheeze GI: S/P  abd surgery; dressing in place; tender MS: No edema Neuro:  Grossly intact Psych: Normal affect   Labs     Chemistry Recent Labs  Lab 11/28/18 1052 11/28/18 1550 11/29/18 0457  NA 135 135 136  K 5.0 5.0 4.1  CL 103 101 101  CO2 21* 22 24  GLUCOSE 153* 130* 83  BUN 27* 28* 31*  CREATININE 2.26* 2.32* 2.49*  CALCIUM 8.7* 8.5* 8.4*  GFRNONAA 27* 26* 24*  GFRAA 31* 30* 28*  ANIONGAP 11 12 11      Hematology Recent Labs  Lab 11/28/18 0105 11/28/18 1052 11/29/18 0457  WBC 12.8* 15.2* 13.0*  RBC 4.27 4.11* 3.98*  HGB 13.2 12.6* 12.3*  HCT 40.5 39.7 38.6*  MCV 94.8 96.6 97.0  MCH 30.9 30.7 30.9  MCHC 32.6 31.7 31.9  RDW 14.4 14.5 14.6  PLT 164 168 157    Radiology    Dg Chest Port 1 View  Result Date: 11/27/2018 CLINICAL DATA:  Hypoxia. EXAM: PORTABLE CHEST 1 VIEW COMPARISON:  09/01/2017.  Chest CT dated 09/21/2018. FINDINGS: Stable enlarged cardiac silhouette. Interval linear density in the medial aspects of the left upper and lower lung zones and small nodular density in the left mid lung zone. Interval minimal patchy density at the left lateral lung base. Clear right lung. Unremarkable bones. Nasogastric tube extending into the stomach. IMPRESSION: 1. Small nodular  density in the left mid lung zone. This may represent a high positioned left nipple shadow based on the previous CT no lung nodule seen at that location at that time. This could be confirmed with a repeat frontal view with nipple markers. 2. Minimal patchy atelectasis or pneumonia at the left lateral lung base. 3. Interval subsegmental atelectasis in the left upper and lower lung zones medially. 4. Stable cardiomegaly. Electronically Signed   By: Claudie Revering M.D.   On: 11/27/2018 16:43   Dg Abd Portable 1v  Result Date: 11/27/2018 CLINICAL DATA:  Encounter for nasogastric (NG) tube placement EXAM: PORTABLE ABDOMEN - 1 VIEW COMPARISON:  Chest radiograph same day FINDINGS: NG tube extends through the GE  junction and stomach into the LEFT lower quadrant. Tube presumably extends through a gastrojejunostomy. Tube tip terminates in the LEFT lower quadrant over the iliac crest. Midline surgical staples noted. IMPRESSION: 1. NG tube extends into stomach and presumably through a gastrojejunostomy into the LEFT lower quadrant. 2. Midline surgical staples noted. Electronically Signed   By: Suzy Bouchard M.D.   On: 11/27/2018 20:19    Patient Profile     78 year old male with past medical history of paroxysmal atrial fibrillation, coronary artery disease with prior PCI, hyperlipidemia, hypertension, prior TIA, ischemic cardiomyopathy with ejection fraction 40 to 45%, obstructive sleep apnea and now status post resection of gastric adenocarcinoma for evaluation of hypertension and coronary disease.  Patient is status post resection of gastric adenocarcinoma. Cardiology asked to assist with management of blood pressure and cardiac medications.  Assessment & Plan    1 paroxysmal atrial fibrillation-patient remains in sinus rhythm today. He is presently n.p.o.  Continue IV metoprolol.  Anticoagulation on hold given recent surgery but will resume postoperatively when okay with general surgery. CHADSvasc 7.   2 coronary artery disease-we will resume statin when able.  No ASA given need for chronic anticoagulation.   3 hypertension-BP controlled; continue IV metoprolol; continue hydralazine as needed. Once he is able to take oral medications we will resume lisinopril (assuming renal function stable) and isosorbide.  4 chronic stage III kidney disease-Cr slightly increased; continue to follow.   5 history of chronic diastolic congestive heart failure-Lasix on hold as pt NPO. Follow closely for volume excess.  6 ischemic cardiomyopathy-we will resume ACE inhibitor when able postoperatively.  Continue IV Lopressor and change back to Toprol when tolerating.  7 status post resection of gastric  adenocarcinoma-Per general surgery.  8 Hyperkalemia-resolved.  For questions or updates, please contact [place] Please consult [email or web address] for contact info under        Signed, Kirk Ruths, MD  11/29/2018, 8:50 AM

## 2018-11-29 NOTE — Progress Notes (Signed)
ANTICOAGULATION CONSULT NOTE - Initial Consult  Pharmacy Consult for Heparin Indication: atrial fibrillation  No Known Allergies  Patient Measurements: Height: 5' 9.5" (176.5 cm) Weight: 299 lb 13.2 oz (136 kg) IBW/kg (Calculated) : 71.85 Heparin Dosing Weight: 104.6 kg  Vital Signs: Temp: 98.3 F (36.8 C) (07/16 1230) Temp Source: Oral (07/16 1230) BP: 130/81 (07/16 1230) Pulse Rate: 95 (07/16 1230)  Labs: Recent Labs    11/27/18 1008  11/28/18 0105 11/28/18 1052 11/28/18 1550 11/29/18 0457  HGB  --    < > 13.2 12.6*  --  12.3*  HCT  --    < > 40.5 39.7  --  38.6*  PLT  --    < > 164 168  --  157  LABPROT 14.2  --   --   --   --   --   INR 1.1  --   --   --   --   --   CREATININE  --    < > 2.54* 2.26* 2.32* 2.49*  CKTOTAL  --   --   --  546*  --   --    < > = values in this interval not displayed.    Estimated Creatinine Clearance: 34.8 mL/min (A) (by C-G formula based on SCr of 2.49 mg/dL (H)).   Medical History: Past Medical History:  Diagnosis Date  . Actinic keratoses 08/09/2013   Overview:  IMPRESSION: Liquid nitrogen x2   . Acute inferior myocardial infarction ([place])    stenting in 2002 to the RCA  . Anemia   . Bowen's disease   . Cancer [facility])    recent  dx stomach cancer per pt 11/14/18  . Cataract    bilateral - surgery to remove  . Chronic kidney disease   . Coronary artery disease   . Diabetes mellitus    type II  . Dupuytren's contracture of left hand   . Hearing loss    bilateral - has hearing aids  . Heart murmur   . History of colonic polyps 04/14/2014  . Hypercholesterolemia   . Hyperlipidemia   . Hypertension    LCX  . Obesity   . Onychomycosis   . ONYCHOMYCOSIS 09/07/2006   Qualifier: Diagnosis of  By: Cletus Gash MD, [place]    . Panic disorder   . Peyronie's disease   . Prostatitis, chronic   . Right bundle branch block   . S/P coronary angioplasty   . Sleep apnea    uses cpap nightly  . Stroke [facility])    takes xarelto  .  Tinnitus     Assessment: 78 year old man with a fib (on Xarelto PTA, last dose on 11/22/18, per pt's wife), S/P diagnostic laparoscopy, distal gastrectomy with retrocolic Billroth II anastomosis for adenocarcinoma of gastric antrum on 7/14). Xarelto was on hold for surgery; pharmacy is consulted to dose heparin for a fib. H/H stable.  Other medical history includes: CKD, HTN, pulmonary fibrosis, sleep apnea.  Pt rec'd Lovenox 40 mg SQ at 08:26 this AM.  Goal of Therapy:  Heparin level 0.3-0.7 units/ml Monitor platelets by anticoagulation protocol: Yes   Plan:  Initiate heparin infusion at 1600 units/hr, with no bolus Check heparin level ~8 hrs after infusion initiation and daily Monitor CBC daily Monitor for sign/symptoms of bleeding  Gillermina Hu, PharmD, BCPS, [facility] Clinical Pharmacist 11/29/2018,3:29 PM

## 2018-11-29 NOTE — Progress Notes (Signed)
[facility] Progress Note   Subjective:   Post op pain, but otherwise ok. Wife at bedside.   Objective Vitals:   11/29/18 0347 11/29/18 0356 11/29/18 0445 11/29/18 0457  BP:  (!) 142/85 132/79   Pulse:   (!) 101   Resp: 20 20 18    Temp:  98.7 F (37.1 C) 99 F (37.2 C)   TempSrc:  Axillary Oral   SpO2: 96% 96% 95%   Weight:    136 kg  Height:       Physical Exam General: obese man lying in bed comfortable when not moving Heart: borderline tachycardic Lungs: clear Abdomen: TTP, midline dressing c/d/i Extremities: no edema  Additional Objective Labs: Basic Metabolic Panel: Recent Labs  Lab 11/28/18 0105 11/28/18 1052 11/28/18 1550 11/29/18 0457  NA 130* 135 135 136  K 6.8* 5.0 5.0 4.1  CL 100 103 101 101  CO2 19* 21* 22 24  GLUCOSE 349* 153* 130* 83  BUN 27* 27* 28* 31*  CREATININE 2.54* 2.26* 2.32* 2.49*  CALCIUM 8.3* 8.7* 8.5* 8.4*  PHOS 2.3*  --   --   --    Liver Function Tests: No results for input(s): AST, ALT, ALKPHOS, BILITOT, PROT, ALBUMIN in the last 168 hours. No results for input(s): LIPASE, AMYLASE in the last 168 hours. CBC: Recent Labs  Lab 11/27/18 1908 11/28/18 0105 11/28/18 1052 11/29/18 0457  WBC 13.8* 12.8* 15.2* 13.0*  HGB 13.9 13.2 12.6* 12.3*  HCT 42.9 40.5 39.7 38.6*  MCV 96.2 94.8 96.6 97.0  PLT 169 164 168 157   Blood Culture    Component Value Date/Time   SDES BLOOD RIGHT ANTECUBITAL 09/01/2017 1724   SPECREQUEST  09/01/2017 1724    BOTTLES DRAWN AEROBIC AND ANAEROBIC Blood Culture adequate volume   CULT  09/01/2017 1724    NO GROWTH 5 DAYS Performed at [facility], [place] [address], [place], [place] [address]    REPTSTATUS 09/06/2017 FINAL 09/01/2017 1724    Cardiac Enzymes: Recent Labs  Lab 11/28/18 1052  CKTOTAL 546*   CBG: Recent Labs  Lab 11/28/18 1112 11/28/18 1547 11/28/18 2035 11/28/18 2335 11/29/18 0444  GLUCAP 152* 121* 138* 116* 79   Iron Studies: No results for  input(s): IRON, TIBC, TRANSFERRIN, FERRITIN in the last 72 hours. @lablastinr3 @ Studies/Results: Dg Chest Port 1 View  Result Date: 11/27/2018 CLINICAL DATA:  Hypoxia. EXAM: PORTABLE CHEST 1 VIEW COMPARISON:  09/01/2017.  Chest CT dated 09/21/2018. FINDINGS: Stable enlarged cardiac silhouette. Interval linear density in the medial aspects of the left upper and lower lung zones and small nodular density in the left mid lung zone. Interval minimal patchy density at the left lateral lung base. Clear right lung. Unremarkable bones. Nasogastric tube extending into the stomach. IMPRESSION: 1. Small nodular density in the left mid lung zone. This may represent a high positioned left nipple shadow based on the previous CT no lung nodule seen at that location at that time. This could be confirmed with a repeat frontal view with nipple markers. 2. Minimal patchy atelectasis or pneumonia at the left lateral lung base. 3. Interval subsegmental atelectasis in the left upper and lower lung zones medially. 4. Stable cardiomegaly. Electronically Signed   By: Claudie Revering M.D.   On: 11/27/2018 16:43   Dg Abd Portable 1v  Result Date: 11/27/2018 CLINICAL DATA:  Encounter for nasogastric (NG) tube placement EXAM: PORTABLE ABDOMEN - 1 VIEW COMPARISON:  Chest radiograph same day FINDINGS: NG tube extends through  the GE junction and stomach into the LEFT lower quadrant. Tube presumably extends through a gastrojejunostomy. Tube tip terminates in the LEFT lower quadrant over the iliac crest. Midline surgical staples noted. IMPRESSION: 1. NG tube extends into stomach and presumably through a gastrojejunostomy into the LEFT lower quadrant. 2. Midline surgical staples noted. Electronically Signed   By: Suzy Bouchard M.D.   On: 11/27/2018 20:19   Medications: . sodium chloride 125 mL/hr at 11/29/18 0530  . acetaminophen 1,000 mg (11/29/18 0606)  . bupivacaine ON-Q pain pump    . methocarbamol (ROBAXIN) IV 500 mg (11/27/18  1618)  . sodium chloride     . budesonide (PULMICORT) nebulizer solution  0.5 mg Nebulization BID  . enoxaparin (LOVENOX) injection  40 mg Subcutaneous Q24H  . HYDROmorphone   Intravenous Q4H  . insulin aspart  0-20 Units Subcutaneous Q4H  . insulin glargine  50 Units Subcutaneous BID  . ipratropium-albuterol  3 mL Nebulization TID  . metoprolol tartrate  7.5 mg Intravenous Q6H  . pantoprazole (PROTONIX) IV  40 mg Intravenous QHS    Assessment/Plan: **  Hyperkalemia:  Resolved- was multifactorial.   **  AKI on CKD:  Baseline Cr ~2, up to 2.49 today but had pronounced diuresis yesterday in setting of augmented diuretic dose to promote potassium excretion.  He most likely now has volume depletion causing the rise in Cr.  Hold lasix today (home dose lasix 40 po daily), give 1L NS bolus now- run over 4 hrs in setting of EF 40%, continue NS 132mL/hr following bolus.     **HTN:  Being managed by Cardiology = on IV meds while NPO. Once po meds acceptable would continue to hold ACEi until K and Cr improved.   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  No immediate complications noted.  NPO until at least today.   **DM:  BG under good control now, insulin per primary.     I expect his renal function will return to baseline.  Would maintain euvolemia after he is volume resuscitated today.  Will sign off, please call with additional concerns.     Jannifer Hick MD 11/29/2018, 6:43 AM  [facility] Pager: [phone]

## 2018-11-29 NOTE — Progress Notes (Signed)
2 Days Post-Op   Subjective/Chief Complaint: Had significant delirium last night.  Doing better today.  Pulled out NGT and OnQ while confused.  Objective: Vital signs in last 24 hours: Temp:  [98.3 F (36.8 C)-99 F (37.2 C)] 98.3 F (36.8 C) (07/16 1230) Pulse Rate:  [91-103] 95 (07/16 1230) Resp:  [15-104] 21 (07/16 1230) BP: (130-153)/(72-86) 130/81 (07/16 1230) SpO2:  [91 %-99 %] 97 % (07/16 1431) Weight:  [109 kg] 136 kg (07/16 0457) Last BM Date: 11/27/18  Intake/Output from previous day: 07/15 0701 - 07/16 0700 In: 1904.2 [I.V.:1604.2; IV Piggyback:300] Out: 5000 [Urine:5000] Intake/Output this shift: Total I/O In: 500 [I.V.:500] Out: 150 [Urine:150]  General appearance: alert, cooperative and no distress.  Oriented x 3 today.   Resp: breathing comfortably Cardio: regular rate and rhythm GI: soft, protuberant as baseline, but sl distended.  dressing c/d/i.  Extremities: extremities normal, atraumatic, no cyanosis or edema  Lab Results:  Recent Labs    11/28/18 1052 11/29/18 0457  WBC 15.2* 13.0*  HGB 12.6* 12.3*  HCT 39.7 38.6*  PLT 168 157   BMET Recent Labs    11/28/18 1550 11/29/18 0457  NA 135 136  K 5.0 4.1  CL 101 101  CO2 22 24  GLUCOSE 130* 83  BUN 28* 31*  CREATININE 2.32* 2.49*  CALCIUM 8.5* 8.4*   PT/INR Recent Labs    11/27/18 1008  LABPROT 14.2  INR 1.1   ABG No results for input(s): PHART, HCO3 in the last 72 hours.  Invalid input(s): PCO2, PO2  Studies/Results: Dg Chest Port 1 View  Result Date: 11/27/2018 CLINICAL DATA:  Hypoxia. EXAM: PORTABLE CHEST 1 VIEW COMPARISON:  09/01/2017.  Chest CT dated 09/21/2018. FINDINGS: Stable enlarged cardiac silhouette. Interval linear density in the medial aspects of the left upper and lower lung zones and small nodular density in the left mid lung zone. Interval minimal patchy density at the left lateral lung base. Clear right lung. Unremarkable bones. Nasogastric tube extending into  the stomach. IMPRESSION: 1. Small nodular density in the left mid lung zone. This may represent a high positioned left nipple shadow based on the previous CT no lung nodule seen at that location at that time. This could be confirmed with a repeat frontal view with nipple markers. 2. Minimal patchy atelectasis or pneumonia at the left lateral lung base. 3. Interval subsegmental atelectasis in the left upper and lower lung zones medially. 4. Stable cardiomegaly. Electronically Signed   By: Claudie Revering M.D.   On: 11/27/2018 16:43   Dg Abd Portable 1v  Result Date: 11/27/2018 CLINICAL DATA:  Encounter for nasogastric (NG) tube placement EXAM: PORTABLE ABDOMEN - 1 VIEW COMPARISON:  Chest radiograph same day FINDINGS: NG tube extends through the GE junction and stomach into the LEFT lower quadrant. Tube presumably extends through a gastrojejunostomy. Tube tip terminates in the LEFT lower quadrant over the iliac crest. Midline surgical staples noted. IMPRESSION: 1. NG tube extends into stomach and presumably through a gastrojejunostomy into the LEFT lower quadrant. 2. Midline surgical staples noted. Electronically Signed   By: Suzy Bouchard M.D.   On: 11/27/2018 20:19    Anti-infectives: Anti-infectives (From admission, onward)   Start     Dose/Rate Route Frequency Ordered Stop   11/27/18 2000  ceFAZolin (ANCEF) IVPB 2g/100 mL premix     2 g 200 mL/hr over 30 Minutes Intravenous Every 8 hours 11/27/18 1732 11/27/18 2030   11/27/18 0600  ceFAZolin (ANCEF) 3 g in dextrose  5 % 50 mL IVPB     3 g 100 mL/hr over 30 Minutes Intravenous On call to O.R. 11/26/18 0806 11/27/18 1228      Assessment/Plan: s/p Procedure(s): LAPAROSCOPY DIAGNOSTIC (N/A) DISTAL GASTRECTOMY (N/A)   CKD/-hyperkalemia- nephrology consult.  Lasix.   A fib /HTN- appreciate cardiology input- will plan restart anticoagulation tomorrow (will start with heparin) Gastric cancer - pT1N1.   Pulmonary fibrosis/Sleep apnea. - pulm  consult.  Try to minimize narcotics, pulmonary toilet. Pain control- low dose PCA, tylenol  Sips today.   Wife came to visit which helped delirium.     LOS: 2 days    [name] 11/29/2018

## 2018-11-29 NOTE — Progress Notes (Addendum)
Physical Therapy Treatment Patient Details Name: Jesse Wheeler MRN: [id number] DOB: 20-Dec-1940 Today's Date: 11/29/2018    History of Present Illness Pt is a 78 y.o. 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     PT Comments    Pt progressing steadily towards physical therapy goals. Ambulating 280 feet with walker and min guard assist. DOE 3/4, SpO2 94% on RA, HR up to 120's. Pt wife reports pt wheezing during mobility is baseline. Less confused this afternoon but still with noted memory deficits. Will progress mobility as tolerated.     Follow Up Recommendations  No PT follow up (pending improved mentation)     Equipment Recommendations  Rolling walker with 5" wheels    Recommendations for Other Services       Precautions / Restrictions Precautions Precautions: Other (comment) Precaution Comments: PCA Restrictions Weight Bearing Restrictions: No    Mobility  Bed Mobility Overal bed mobility: Needs Assistance Bed Mobility: Supine to Sit;Sit to Supine     Supine to sit: Min guard Sit to supine: Min assist   General bed mobility comments: Min guard to sit up on edge of bed, increased time and effort, cues for using bed rail. Min assist for LLE negotiation back into bed  Transfers Overall transfer level: Needs assistance Equipment used: Rolling walker (2 wheeled) Transfers: Sit to/from Stand Sit to Stand: Min guard            Ambulation/Gait Ambulation/Gait assistance: Min guard Gait Distance (Feet): 280 Feet Assistive device: Rolling walker (2 wheeled) Gait Pattern/deviations: Step-through pattern;Trunk flexed Gait velocity: decreased   General Gait Details: cues for upright posture, walker proximity. guarded posture, DOE 3/4   Stairs             Wheelchair Mobility    Modified Rankin (Stroke Patients  Only)       Balance Overall balance assessment: Needs assistance Sitting-balance support: Feet supported Sitting balance-Leahy Scale: Good     Standing balance support: No upper extremity supported;During functional activity Standing balance-Leahy Scale: Fair                              Cognition Arousal/Alertness: Awake/alert Behavior During Therapy: WFL for tasks assessed/performed Overall Cognitive Status: Impaired/Different from baseline Area of Impairment: Safety/judgement;Memory                     Memory: Decreased short-term memory   Safety/Judgement: Decreased awareness of safety;Decreased awareness of deficits     General Comments: A&Ox4 today, pt wife reports he is "more with it," this afternoon.      Exercises      General Comments        Pertinent Vitals/Pain Pain Assessment: Faces Faces Pain Scale: Hurts even more Pain Location: abdomen with movement Pain Descriptors / Indicators: Grimacing;Guarding Pain Intervention(s): Monitored during session    Home Living                      Prior Function            PT Goals (current goals can now be found in the care plan section) Acute Rehab PT Goals Patient Stated Goal: "work in my garage." PT Goal Formulation: With patient Time For Goal Achievement: 12/12/18 Potential to Achieve Goals: Good Progress towards PT goals: Progressing toward  goals    Frequency    Min 3X/week      PT Plan Current plan remains appropriate    Co-evaluation              AM-PAC PT "6 Clicks" Mobility   Outcome Measure  Help needed turning from your back to your side while in a flat bed without using bedrails?: None Help needed moving from lying on your back to sitting on the side of a flat bed without using bedrails?: A Little Help needed moving to and from a bed to a chair (including a wheelchair)?: None Help needed standing up from a chair using your arms (e.g., wheelchair  or bedside chair)?: None Help needed to walk in hospital room?: A Little Help needed climbing 3-5 steps with a railing? : A Little 6 Click Score: 21    End of Session Equipment Utilized During Treatment: Gait belt Activity Tolerance: Patient tolerated treatment well Patient left: with call bell/phone within reach;in bed;with bed alarm set;with family/visitor present Nurse Communication: Mobility status PT Visit Diagnosis: Pain;Difficulty in walking, not elsewhere classified (R26.2) Pain - part of body: (abdomen)     Time: 5456-2563 PT Time Calculation (min) (ACUTE ONLY): 18 min  Charges:  $Gait Training: 8-22 mins                     Ellamae Sia, PT, DPT [facility] Pager [phone] Office [phone]    Willy Eddy 11/29/2018, 4:42 PM

## 2018-11-30 LAB — CBC
HCT: 37.2 % — ABNORMAL LOW (ref 39.0–52.0)
Hemoglobin: 12 g/dL — ABNORMAL LOW (ref 13.0–17.0)
MCH: 30.9 pg (ref 26.0–34.0)
MCHC: 32.3 g/dL (ref 30.0–36.0)
MCV: 95.9 fL (ref 80.0–100.0)
Platelets: 153 10*3/uL (ref 150–400)
RBC: 3.88 MIL/uL — ABNORMAL LOW (ref 4.22–5.81)
RDW: 14.7 % (ref 11.5–15.5)
WBC: 9.9 10*3/uL (ref 4.0–10.5)
nRBC: 0 % (ref 0.0–0.2)

## 2018-11-30 LAB — MRSA PCR SCREENING: MRSA by PCR: NEGATIVE

## 2018-11-30 LAB — BASIC METABOLIC PANEL
Anion gap: 11 (ref 5–15)
BUN: 26 mg/dL — ABNORMAL HIGH (ref 8–23)
CO2: 21 mmol/L — ABNORMAL LOW (ref 22–32)
Calcium: 8.3 mg/dL — ABNORMAL LOW (ref 8.9–10.3)
Chloride: 105 mmol/L (ref 98–111)
Creatinine, Ser: 2.09 mg/dL — ABNORMAL HIGH (ref 0.61–1.24)
GFR calc Af Amer: 35 mL/min — ABNORMAL LOW (ref >60)
GFR calc non Af Amer: 30 mL/min — ABNORMAL LOW (ref >60)
Glucose, Bld: 77 mg/dL (ref 70–99)
Potassium: 4 mmol/L (ref 3.5–5.1)
Sodium: 137 mmol/L (ref 135–145)

## 2018-11-30 LAB — HEPARIN LEVEL (UNFRACTIONATED)
Heparin Unfractionated: 0.13 IU/mL — ABNORMAL LOW (ref 0.30–0.70)
Heparin Unfractionated: 0.25 IU/mL — ABNORMAL LOW (ref 0.30–0.70)
Heparin Unfractionated: 0.27 IU/mL — ABNORMAL LOW (ref 0.30–0.70)

## 2018-11-30 LAB — GLUCOSE, CAPILLARY
Glucose-Capillary: 110 mg/dL — ABNORMAL HIGH (ref 70–99)
Glucose-Capillary: 124 mg/dL — ABNORMAL HIGH (ref 70–99)
Glucose-Capillary: 126 mg/dL — ABNORMAL HIGH (ref 70–99)
Glucose-Capillary: 129 mg/dL — ABNORMAL HIGH (ref 70–99)
Glucose-Capillary: 132 mg/dL — ABNORMAL HIGH (ref 70–99)
Glucose-Capillary: 67 mg/dL — ABNORMAL LOW (ref 70–99)
Glucose-Capillary: 69 mg/dL — ABNORMAL LOW (ref 70–99)
Glucose-Capillary: 74 mg/dL (ref 70–99)
Glucose-Capillary: 96 mg/dL (ref 70–99)

## 2018-11-30 MED ORDER — TRAMADOL HCL 50 MG PO TABS: 50.0000 mg | ORAL_TABLET | Freq: Four times a day (QID) | ORAL | Status: DC | PRN

## 2018-11-30 MED ORDER — OXYCODONE HCL 5 MG PO TABS: 5.0000 mg | ORAL_TABLET | Freq: Three times a day (TID) | ORAL | Status: DC | PRN

## 2018-11-30 MED ORDER — HYDROMORPHONE HCL 1 MG/ML IJ SOLN: 1.0000 mg | INTRAMUSCULAR | Status: DC | PRN

## 2018-11-30 MED ORDER — TAMSULOSIN HCL 0.4 MG PO CAPS: 0.4000 mg | ORAL_CAPSULE | Freq: Every day | ORAL | Status: DC

## 2018-11-30 NOTE — Progress Notes (Signed)
ANTICOAGULATION CONSULT NOTE  Pharmacy Consult for Heparin Indication: atrial fibrillation  No Known Allergies  Patient Measurements: Height: 5' 9.5" (176.5 cm) Weight: 299 lb 13.2 oz (136 kg) IBW/kg (Calculated) : 71.85 Heparin Dosing Weight: 104.6 kg  Vital Signs: Temp: 98.1 F (36.7 C) (07/17 0014) Temp Source: Axillary (07/17 0014) BP: 115/78 (07/16 2032) Pulse Rate: 101 (07/17 0014)  Labs: Recent Labs    11/27/18 1008  11/28/18 0105 11/28/18 1052 11/28/18 1550 11/29/18 0457 11/29/18 1604 11/29/18 2352  HGB  --    < > 13.2 12.6*  --  12.3*  --   --   HCT  --    < > 40.5 39.7  --  38.6*  --   --   PLT  --    < > 164 168  --  157  --   --   APTT  --   --   --   --   --   --  39*  --   LABPROT 14.2  --   --   --   --   --   --   --   INR 1.1  --   --   --   --   --   --   --   HEPARINUNFRC  --   --   --   --   --   --   --  0.25*  CREATININE  --    < > 2.54* 2.26* 2.32* 2.49*  --   --   CKTOTAL  --   --   --  546*  --   --   --   --    < > = values in this interval not displayed.    Estimated Creatinine Clearance: 34.8 mL/min (A) (by C-G formula based on SCr of 2.49 mg/dL (H)).   Assessment: 78 y.o. male with h/o Afib, Xarelto on hold, for heparin  Goal of Therapy:  Heparin level 0.3-0.7 units/ml Monitor platelets by anticoagulation protocol: Yes   Plan:  Increase Heparin 1750 units/hr Follow-up am labs.   Phillis Knack, PharmD, BCPS  11/30/2018,12:40 AM

## 2018-11-30 NOTE — Progress Notes (Signed)
3 Days Post-Op   Subjective/Chief Complaint: Was doing better when I spoke to him this morning.  No n/v.  No flatus.    Objective: Vital signs in last 24 hours: Temp:  [98.1 F (36.7 C)-98.6 F (37 C)] 98.6 F (37 C) (07/17 0828) Pulse Rate:  [93-110] 93 (07/17 0828) Resp:  [16-27] 24 (07/17 0828) BP: (115-134)/(76-83) 120/76 (07/17 0828) SpO2:  [92 %-97 %] 95 % (07/17 0828) FiO2 (%):  [34 %] 34 % (07/17 0337) Weight:  [138.4 kg] 138.4 kg (07/17 0349) Last BM Date: 11/29/18  Intake/Output from previous day: 07/16 0701 - 07/17 0700 In: 3274 [P.O.:120; I.V.:2154; IV Piggyback:1000] Out: 400 [Urine:400] Intake/Output this shift: No intake/output data recorded.  General appearance: alert, cooperative and no distress.  Oriented x 3 this Am.   Resp: breathing comfortably Cardio: regular rate and rhythm GI: soft, protuberant as baseline, but less distended than yesterday.    dressing c/d/i.  Extremities: extremities normal, atraumatic, no cyanosis or edema  Lab Results:  Recent Labs    11/29/18 0457 11/29/18 2352  WBC 13.0* 9.9  HGB 12.3* 12.0*  HCT 38.6* 37.2*  PLT 157 153   BMET Recent Labs    11/29/18 0457 11/29/18 2352  NA 136 137  K 4.1 4.0  CL 101 105  CO2 24 21*  GLUCOSE 83 77  BUN 31* 26*  CREATININE 2.49* 2.09*  CALCIUM 8.4* 8.3*   PT/INR No results for input(s): LABPROT, INR in the last 72 hours. ABG No results for input(s): PHART, HCO3 in the last 72 hours.  Invalid input(s): PCO2, PO2  Studies/Results: No results found.  Anti-infectives: Anti-infectives (From admission, onward)   Start     Dose/Rate Route Frequency Ordered Stop   11/27/18 2000  ceFAZolin (ANCEF) IVPB 2g/100 mL premix     2 g 200 mL/hr over 30 Minutes Intravenous Every 8 hours 11/27/18 1732 11/27/18 2030   11/27/18 0600  ceFAZolin (ANCEF) 3 g in dextrose 5 % 50 mL IVPB     3 g 100 mL/hr over 30 Minutes Intravenous On call to O.R. 11/26/18 0806 11/27/18 1228       Assessment/Plan: s/p Procedure(s): LAPAROSCOPY DIAGNOSTIC (N/A) DISTAL GASTRECTOMY (N/A)   CKD/-hyperkalemia- Creatinine improving.  K normal today.     A fib /HTN- appreciate cardiology input- heparin gtt for now.   Gastric cancer - pT1N1.  Advised patient of pathology today.   Pulmonary fibrosis/Sleep apnea. - pulm consult.  Try to minimize narcotics, pulmonary toilet. Pain control- low dose PCA, tylenol  Plan d/c PCA. Full liquids today.     LOS: 3 days    [name] 11/30/2018

## 2018-11-30 NOTE — Progress Notes (Signed)
ANTICOAGULATION CONSULT NOTE  Pharmacy Consult for Heparin Indication: atrial fibrillation  No Known Allergies  Patient Measurements: Height: 5' 9.5" (176.5 cm) Weight: (!) 305 lb 1.9 oz (138.4 kg) IBW/kg (Calculated) : 71.85 Heparin Dosing Weight: 104.6 kg  Vital Signs: Temp: 98.6 F (37 C) (07/17 0828) Temp Source: Oral (07/17 0828) BP: 120/76 (07/17 0828) Pulse Rate: 93 (07/17 0828)  Labs: Recent Labs    11/27/18 1008  11/28/18 1052 11/28/18 1550 11/29/18 0457 11/29/18 1604 11/29/18 2352 11/30/18 0805  HGB  --    < > 12.6*  --  12.3*  --  12.0*  --   HCT  --    < > 39.7  --  38.6*  --  37.2*  --   PLT  --    < > 168  --  157  --  153  --   APTT  --   --   --   --   --  39*  --   --   LABPROT 14.2  --   --   --   --   --   --   --   INR 1.1  --   --   --   --   --   --   --   HEPARINUNFRC  --   --   --   --   --   --  0.25* 0.13*  CREATININE  --    < > 2.26* 2.32* 2.49*  --  2.09*  --   CKTOTAL  --   --  546*  --   --   --   --   --    < > = values in this interval not displayed.    Estimated Creatinine Clearance: 41.9 mL/min (A) (by C-G formula based on SCr of 2.09 mg/dL (H)).   Assessment: 78 year old man with Afib (on Xarelto PTA, last dose on 11/22/18, per pt's wife).  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  Pharmacy consulted to dose IV heparin while Xarelto is on hold.  Heparin level is sub-therapeutic and trended down post rate increase overnight.  RN reports that heparin infusion was off for an unknown amount of time at an unknown time.  Suspect it is off after the rate increase.  No bleeding post surgery.  Goal of Therapy:  Heparin level 0.3-0.7 units/ml Monitor platelets by anticoagulation protocol: Yes   Plan:  Increase heparin gtt slightly to 1850 units/hr, no bolus Check 6 hr heparin level Daily heparin level and CBC Monitor for s/sx of  bleeding  Modean Mccullum D. Mina Marble, PharmD, BCPS, [place] 11/30/2018, 10:00 AM

## 2018-11-30 NOTE — Progress Notes (Signed)
Patient very irritated earlier, thought that he was going home today. Dr. Barry Dienes spoke to him on the phone and he understands he will have to stay at least 2 more days. He tolerated a full liquid lunch. One of his IV's was accidentally pulled out by the patient. IV team notified to replace. Wife currently at bedside, very helpful at this time, with his intermittent confusion. Will continue to monitor.

## 2018-11-30 NOTE — Progress Notes (Signed)
Progress Note  Patient Name: Jesse Wheeler Date of Encounter: 11/30/2018  Primary Cardiologist: Minus Breeding, MD   Subjective   Abdominal pain; mild DOE; no CP  Inpatient Medications    Scheduled Meds: . budesonide (PULMICORT) nebulizer solution  0.5 mg Nebulization BID  . HYDROmorphone   Intravenous Q4H  . insulin aspart  0-20 Units Subcutaneous Q4H  . insulin glargine  50 Units Subcutaneous BID  . ipratropium-albuterol  3 mL Nebulization TID  . metoprolol tartrate  7.5 mg Intravenous Q6H  . pantoprazole (PROTONIX) IV  40 mg Intravenous QHS   Continuous Infusions: . sodium chloride 125 mL/hr at 11/30/18 0535  . heparin 1,750 Units/hr (11/30/18 0054)  . methocarbamol (ROBAXIN) IV 500 mg (11/27/18 1618)   PRN Meds: acetaminophen **OR** acetaminophen, diphenhydrAMINE **OR** diphenhydrAMINE, haloperidol lactate, hydrALAZINE, methocarbamol (ROBAXIN) IV, naloxone **AND** sodium chloride flush, nitroGLYCERIN, ondansetron (ZOFRAN) IV, ondansetron **OR** [DISCONTINUED] ondansetron (ZOFRAN) IV, traMADol   Vital Signs    Vitals:   11/30/18 0337 11/30/18 0349 11/30/18 0358 11/30/18 0828  BP:    120/76  Pulse:    93  Resp: 17   (!) 24  Temp:   98.2 F (36.8 C) 98.6 F (37 C)  TempSrc:   Oral Oral  SpO2: 92%   95%  Weight:  (!) 138.4 kg    Height:        Intake/Output Summary (Last 24 hours) at 11/30/2018 0950 Last data filed at 11/30/2018 0600 Gross per 24 hour  Intake 3024.03 ml  Output 250 ml  Net 2774.03 ml   Last 3 Weights 11/30/2018 11/29/2018 11/27/2018  Weight (lbs) 305 lb 1.9 oz 299 lb 13.2 oz 307 lb  Weight (kg) 138.4 kg 136 kg 139.254 kg      Telemetry    Sinus with occasional PVCs- Personally Reviewed   Physical Exam   GEN: No acute distress.  obese Neck: supple, no JVD Cardiac: RRR Respiratory: CTA GI: S/P abd surgery; dressing in place MS: No edema Neuro:  No focal findings   Labs     Chemistry Recent Labs  Lab 11/28/18 1550 11/29/18  0457 11/29/18 2352  NA 135 136 137  K 5.0 4.1 4.0  CL 101 101 105  CO2 22 24 21*  GLUCOSE 130* 83 77  BUN 28* 31* 26*  CREATININE 2.32* 2.49* 2.09*  CALCIUM 8.5* 8.4* 8.3*  GFRNONAA 26* 24* 30*  GFRAA 30* 28* 35*  ANIONGAP 12 11 11      Hematology Recent Labs  Lab 11/28/18 1052 11/29/18 0457 11/29/18 2352  WBC 15.2* 13.0* 9.9  RBC 4.11* 3.98* 3.88*  HGB 12.6* 12.3* 12.0*  HCT 39.7 38.6* 37.2*  MCV 96.6 97.0 95.9  MCH 30.7 30.9 30.9  MCHC 31.7 31.9 32.3  RDW 14.5 14.6 14.7  PLT 168 157 153    Patient Profile     78 year old male with past medical history of paroxysmal atrial fibrillation, coronary artery disease with prior PCI, hyperlipidemia, hypertension, prior TIA, ischemic cardiomyopathy with ejection fraction 40 to 45%, obstructive sleep apnea and now status post resection of gastric adenocarcinoma for evaluation of hypertension and coronary disease.  Patient is status post resection of gastric adenocarcinoma. Cardiology asked to assist with management of blood pressure and cardiac medications.  Assessment & Plan    1 paroxysmal atrial fibrillation-patient remains in sinus rhythm today. He is presently n.p.o.  Continue IV metoprolol. CHADSvasc 7.  Resume Xarelto at previous dose when okay with surgery.  2 coronary artery disease-we will  resume statin when able.  Would not resume aspirin given need for Xarelto.  3 hypertension-BP controlled; continue IV metoprolol; continue hydralazine as needed. Once he is able to take oral medications would resume lisinopril (assuming renal function stable) and isosorbide.  4 chronic stage III kidney disease-creatinine improved.  5 history of chronic diastolic congestive heart failure-resume Lasix at preadmission dose at time of discharge.  6 ischemic cardiomyopathy-we will resume ACE inhibitor when able postoperatively.  Continue IV Lopressor and change back to Toprol when tolerating.  7 status post resection of gastric  adenocarcinoma-Per general surgery.  8 Hyperkalemia-resolved.  We will see again on Monday if patient remains in house.  Please call over weekend with questions.  For questions or updates, please contact [place] Please consult [email or web address] for contact info under        Signed, Kirk Ruths, MD  11/30/2018, 9:50 AM

## 2018-11-30 NOTE — Progress Notes (Addendum)
ANTICOAGULATION CONSULT NOTE  Pharmacy Consult for Heparin Indication: atrial fibrillation  No Known Allergies  Patient Measurements: Height: 5' 9.5" (176.5 cm) Weight: (!) 305 lb 1.9 oz (138.4 kg) IBW/kg (Calculated) : 71.85 Heparin Dosing Weight: 104.6 kg  Vital Signs: Temp: 98.5 F (36.9 C) (07/17 1631) Temp Source: Oral (07/17 1631) BP: 143/73 (07/17 1229) Pulse Rate: 100 (07/17 1436)  Labs: Recent Labs    11/28/18 1052 11/28/18 1550 11/29/18 0457 11/29/18 1604 11/29/18 2352 11/30/18 0805 11/30/18 1703  HGB 12.6*  --  12.3*  --  12.0*  --   --   HCT 39.7  --  38.6*  --  37.2*  --   --   PLT 168  --  157  --  153  --   --   APTT  --   --   --  39*  --   --   --   HEPARINUNFRC  --   --   --   --  0.25* 0.13* 0.27*  CREATININE 2.26* 2.32* 2.49*  --  2.09*  --   --   CKTOTAL 546*  --   --   --   --   --   --     Estimated Creatinine Clearance: 41.9 mL/min (A) (by C-G formula based on SCr of 2.09 mg/dL (H)).   Assessment: 78 year old man with Afib (on Xarelto PTA, last dose on 11/22/18, per pt's wife).  S/P diagnostic laparoscopy, distal gastrectomy with retrocolic Billroth II anastomosis for adenocarcinoma of gastric antrum on 11/27/18.  Pharmacy consulted to dose IV heparin while Xarelto is on hold.  Heparin level at 17:03 this afternoon (~7 hrs after infusion rate increase to 1850 units/hr) is 0.27 units/ml, which is below the goal range for heparin. No issues with IV or signs/symptoms of bleeding, per RN.  Goal of Therapy:  Heparin level 0.3-0.7 units/ml Monitor platelets by anticoagulation protocol: Yes   Plan:  Increase heparin infusion to 2000 units/hr, no bolus Check heparin level 8 hrs after heparin infusion rate increase Monitor daily heparin level and CBC Monitor for s/sx of bleeding  Gillermina Hu, PharmD, BCPS, [facility] Clinical Pharmacist 11/30/2018, 6:19 PM

## 2018-11-30 NOTE — Progress Notes (Signed)
20cc dilaudid PCA wasted. Catalina Lunger RN witness

## 2018-12-01 LAB — CBC
HCT: 37 % — ABNORMAL LOW (ref 39.0–52.0)
Hemoglobin: 12 g/dL — ABNORMAL LOW (ref 13.0–17.0)
MCH: 31.3 pg (ref 26.0–34.0)
MCHC: 32.4 g/dL (ref 30.0–36.0)
MCV: 96.6 fL (ref 80.0–100.0)
Platelets: 165 10*3/uL (ref 150–400)
RBC: 3.83 MIL/uL — ABNORMAL LOW (ref 4.22–5.81)
RDW: 14.6 % (ref 11.5–15.5)
WBC: 8.7 10*3/uL (ref 4.0–10.5)
nRBC: 0 % (ref 0.0–0.2)

## 2018-12-01 LAB — BASIC METABOLIC PANEL
Anion gap: 10 (ref 5–15)
BUN: 22 mg/dL (ref 8–23)
CO2: 21 mmol/L — ABNORMAL LOW (ref 22–32)
Calcium: 8.5 mg/dL — ABNORMAL LOW (ref 8.9–10.3)
Chloride: 107 mmol/L (ref 98–111)
Creatinine, Ser: 1.81 mg/dL — ABNORMAL HIGH (ref 0.61–1.24)
GFR calc Af Amer: 41 mL/min — ABNORMAL LOW (ref >60)
GFR calc non Af Amer: 36 mL/min — ABNORMAL LOW (ref >60)
Glucose, Bld: 146 mg/dL — ABNORMAL HIGH (ref 70–99)
Potassium: 4.3 mmol/L (ref 3.5–5.1)
Sodium: 138 mmol/L (ref 135–145)

## 2018-12-01 LAB — GLUCOSE, CAPILLARY
Glucose-Capillary: 139 mg/dL — ABNORMAL HIGH (ref 70–99)
Glucose-Capillary: 151 mg/dL — ABNORMAL HIGH (ref 70–99)
Glucose-Capillary: 148 mg/dL — ABNORMAL HIGH (ref 70–99)
Glucose-Capillary: 192 mg/dL — ABNORMAL HIGH (ref 70–99)
Glucose-Capillary: 184 mg/dL — ABNORMAL HIGH (ref 70–99)
Glucose-Capillary: 187 mg/dL — ABNORMAL HIGH (ref 70–99)

## 2018-12-01 LAB — HEPARIN LEVEL (UNFRACTIONATED): Heparin Unfractionated: 0.46 IU/mL (ref 0.30–0.70)

## 2018-12-01 NOTE — Progress Notes (Signed)
RN spoke with PA. He said to hold off on placing a new PIV for fluids for now, and we will check Pt's labs in the morning and place if necessary.

## 2018-12-01 NOTE — Progress Notes (Signed)
[place] for Heparin Indication: atrial fibrillation  No Known Allergies  Patient Measurements: Height: 5' 9.5" (176.5 cm) Weight: (!) 305 lb 1.9 oz (138.4 kg) IBW/kg (Calculated) : 71.85 Heparin Dosing Weight: 104.6 kg  Vital Signs: Temp: 98.6 F (37 C) (07/18 0728) Temp Source: Oral (07/18 0728) BP: 145/83 (07/18 0808) Pulse Rate: 87 (07/18 0808)  Labs: Recent Labs    11/28/18 1052  11/29/18 0457 11/29/18 1604  11/29/18 2352 11/30/18 0805 11/30/18 1703 12/01/18 0313  HGB 12.6*  --  12.3*  --   --  12.0*  --   --  12.0*  HCT 39.7  --  38.6*  --   --  37.2*  --   --  37.0*  PLT 168  --  157  --   --  153  --   --  165  APTT  --   --   --  39*  --   --   --   --   --   HEPARINUNFRC  --   --   --   --    < > 0.25* 0.13* 0.27* 0.46  CREATININE 2.26*   < > 2.49*  --   --  2.09*  --   --  1.81*  CKTOTAL 546*  --   --   --   --   --   --   --   --    < > = values in this interval not displayed.    Estimated Creatinine Clearance: 48.4 mL/min (A) (by C-G formula based on SCr of 1.81 mg/dL (H)).   Assessment: 78 year old man with Afib (on Xarelto PTA, last dose on 11/22/18, per pt's wife).  S/P diagnostic laparoscopy, distal gastrectomy with retrocolic Billroth II anastomosis for adenocarcinoma of gastric antrum on 11/27/18.  Pharmacy consulted to dose IV heparin while Xarelto is on hold.  Heparin level is therapeutic; no bleeding documented.  Goal of Therapy:  Heparin level 0.3-0.7 units/ml Monitor platelets by anticoagulation protocol: Yes   Plan:  Continue heparin gtt at 2000 units/hr Daily heparin level and CBC Monitor for s/sx of bleeding  Chrystal Zeimet D. Mina Marble, PharmD, BCPS, [place] 12/01/2018, 10:04 AM

## 2018-12-01 NOTE — Progress Notes (Signed)
Spoke with Elmyra Ricks RN regarding PIV consult. Patient has had multiple PIVs during hospital stay. Taking clear liquids 100% this am. Will hold off with PIV restart until MD rounds and Darrold Span will follow up with this nurse regarding POC to notify if PIV restart is still needed. Fran Lowes, RN VAST

## 2018-12-01 NOTE — Progress Notes (Signed)
Patient has home CPAP that he places on/off himself when he is ready to sleep. RT instructed patient to have RT called if assistance is needed. RT will monitor as needed.

## 2018-12-01 NOTE — Progress Notes (Signed)
    4 Days Post-Op  Subjective: CC: Doing well. Tolerating FLD. Abdominal pain minimal and around midline. No N/V. Passing flatus. Had 2 BMs/24 hours.   Objective: Vital signs in last 24 hours: Temp:  [97.9 F (36.6 C)-98.9 F (37.2 C)] 98.6 F (37 C) (07/18 0728) Pulse Rate:  [87-100] 87 (07/18 0808) Resp:  [16-25] 16 (07/18 0808) BP: (143-156)/(73-83) 145/83 (07/18 0808) SpO2:  [94 %-100 %] 100 % (07/18 0808) Last BM Date: 11/29/18  Intake/Output from previous day: 07/17 0701 - 07/18 0700 In: 840 [P.O.:840] Out: 800 [Urine:800] Intake/Output this shift: Total I/O In: -  Out: 100 [Urine:100]  PE: Gen:  Alert, NAD, pleasant Heart: RRR Pulm:  Normal rate and effort  Abd: Soft, protuberant with mild distension. Appropriatley tender around midline incisoin. Honeycomb dressing removed. Midline incision with staples in place and appears c/d/i. +BS Psych: A&Ox3  Skin: no rashes noted, warm and dry   Lab Results:  Recent Labs    11/29/18 2352 12/01/18 0313  WBC 9.9 8.7  HGB 12.0* 12.0*  HCT 37.2* 37.0*  PLT 153 165   BMET Recent Labs    11/29/18 2352 12/01/18 0313  NA 137 138  K 4.0 4.3  CL 105 107  CO2 21* 21*  GLUCOSE 77 146*  BUN 26* 22  CREATININE 2.09* 1.81*  CALCIUM 8.3* 8.5*   PT/INR No results for input(s): LABPROT, INR in the last 72 hours. CMP     Component Value Date/Time   NA 138 12/01/2018 0313   K 4.3 12/01/2018 0313   CL 107 12/01/2018 0313   CO2 21 (L) 12/01/2018 0313   GLUCOSE 146 (H) 12/01/2018 0313   BUN 22 12/01/2018 0313   CREATININE 1.81 (H) 12/01/2018 0313   CALCIUM 8.5 (L) 12/01/2018 0313   PROT 6.0 (L) 11/14/2018 1208   ALBUMIN 3.5 11/14/2018 1208   AST 58 (H) 11/14/2018 1208   ALT 43 11/14/2018 1208   ALKPHOS 55 11/14/2018 1208   BILITOT 0.7 11/14/2018 1208   GFRNONAA 36 (L) 12/01/2018 0313   GFRAA 41 (L) 12/01/2018 0313   Lipase  No results found for: LIPASE     Studies/Results: No results found.   Anti-infectives: Anti-infectives (From admission, onward)   Start     Dose/Rate Route Frequency Ordered Stop   11/27/18 2000  ceFAZolin (ANCEF) IVPB 2g/100 mL premix     2 g 200 mL/hr over 30 Minutes Intravenous Every 8 hours 11/27/18 1732 11/27/18 2030   11/27/18 0600  ceFAZolin (ANCEF) 3 g in dextrose 5 % 50 mL IVPB     3 g 100 mL/hr over 30 Minutes Intravenous On call to O.R. 11/26/18 0806 11/27/18 1228       Assessment/Plan Gastric Cancer S/p Diagnostic laparoscopy, distal gastrectomy with retrocolic Billroth II anastomosis, Dr. Barry Dienes, 7/14, POD 4 - Path report: GASTRIC ADENOCARCINOMA, 1.4 CM. TUMOR EXTENDS INTO SUBMUCOSA. MARGINS NOT INVOLVED. METASTATIC CARCINOMA IN ONE OF TEN LYMPH NODES. - Continue FLD. Having bowel function  CKD/-hyperkalemia- Creatinine improving.  K normal today.     A fib /HTN- appreciate cardiology input- heparin gtt for now. Will discuss when okay to transition back to Xarelto.   Pulmonary fibrosis/Sleep apnea. - pulm consult.  Try to minimize narcotics, pulmonary toilet. Pain control- low dose PCA, tylenol  FEN - FLD VTE - SCD, Heparin ID - Ancef peri-op   LOS: 4 days    Jillyn Ledger , [facility] Surgery 12/01/2018, 8:53 AM Pager: [phone]

## 2018-12-02 LAB — CBC
HCT: 37.5 % — ABNORMAL LOW (ref 39.0–52.0)
Hemoglobin: 12 g/dL — ABNORMAL LOW (ref 13.0–17.0)
MCH: 30.8 pg (ref 26.0–34.0)
MCHC: 32 g/dL (ref 30.0–36.0)
MCV: 96.2 fL (ref 80.0–100.0)
Platelets: 182 10*3/uL (ref 150–400)
RBC: 3.9 MIL/uL — ABNORMAL LOW (ref 4.22–5.81)
RDW: 14.6 % (ref 11.5–15.5)
WBC: 8.2 10*3/uL (ref 4.0–10.5)
nRBC: 0 % (ref 0.0–0.2)

## 2018-12-02 LAB — GLUCOSE, CAPILLARY
Glucose-Capillary: 177 mg/dL — ABNORMAL HIGH (ref 70–99)
Glucose-Capillary: 175 mg/dL — ABNORMAL HIGH (ref 70–99)
Glucose-Capillary: 197 mg/dL — ABNORMAL HIGH (ref 70–99)
Glucose-Capillary: 166 mg/dL — ABNORMAL HIGH (ref 70–99)

## 2018-12-02 LAB — BASIC METABOLIC PANEL
Anion gap: 9 (ref 5–15)
BUN: 20 mg/dL (ref 8–23)
CO2: 23 mmol/L (ref 22–32)
Calcium: 8.5 mg/dL — ABNORMAL LOW (ref 8.9–10.3)
Chloride: 105 mmol/L (ref 98–111)
Creatinine, Ser: 1.81 mg/dL — ABNORMAL HIGH (ref 0.61–1.24)
GFR calc Af Amer: 41 mL/min — ABNORMAL LOW (ref >60)
GFR calc non Af Amer: 36 mL/min — ABNORMAL LOW (ref >60)
Glucose, Bld: 180 mg/dL — ABNORMAL HIGH (ref 70–99)
Potassium: 4 mmol/L (ref 3.5–5.1)
Sodium: 137 mmol/L (ref 135–145)

## 2018-12-02 LAB — HEPARIN LEVEL (UNFRACTIONATED): Heparin Unfractionated: 0.49 IU/mL (ref 0.30–0.70)

## 2018-12-02 MED ORDER — PANTOPRAZOLE SODIUM 40 MG PO TBEC
40.0000 mg | DELAYED_RELEASE_TABLET | Freq: Every day | ORAL | Status: DC
  Administered 2018-12-02: 40 mg via ORAL
  Filled 2018-12-02: qty 1

## 2018-12-02 MED ORDER — IPRATROPIUM-ALBUTEROL 0.5-2.5 (3) MG/3ML IN SOLN
3.0000 mL | Freq: Two times a day (BID) | RESPIRATORY_TRACT | Status: DC
  Administered 2018-12-02 – 2018-12-03 (×2): 3 mL via RESPIRATORY_TRACT
  Filled 2018-12-02 (×2): qty 3

## 2018-12-02 NOTE — Progress Notes (Signed)
RT set up CPAP and placed on bedside table. Patient stated he can place himself on/off CPAP when ready. RT instructed patient to have RT called if assistance is needed. RT will monitor as needed.

## 2018-12-02 NOTE — Progress Notes (Signed)
    5 Days Post-Op  Subjective: CC: Doing well. Was advanced to soft diet yesterday, tolerating. Passing flatus. Last BM recorded this AM. No N/V.   Objective: Vital signs in last 24 hours: Temp:  [97.3 F (36.3 C)-98.7 F (37.1 C)] 98.2 F (36.8 C) (07/19 0817) Pulse Rate:  [83-104] 83 (07/19 0817) Resp:  [17-20] 20 (07/19 0817) BP: (128-153)/(72-84) 144/84 (07/19 0817) SpO2:  [96 %-100 %] 97 % (07/19 0817) Last BM Date: 12/02/18  Intake/Output from previous day: 07/18 0701 - 07/19 0700 In: 1115.6 [P.O.:480; I.V.:635.6] Out: 1700 [Urine:1700] Intake/Output this shift: No intake/output data recorded.  PE: Gen:  Alert, NAD, pleasant Heart: RRR Pulm:  Normal rate and effort  Abd: Soft, protuberant with mild distension. NT  Midline incision and RUQ laparoscopic incision with staples in place and appears c/d/i. +BS Psych: A&Ox3  Skin: no rashes noted, warm and dry  Lab Results:  Recent Labs    12/01/18 0313 12/02/18 0615  WBC 8.7 8.2  HGB 12.0* 12.0*  HCT 37.0* 37.5*  PLT 165 182   BMET Recent Labs    12/01/18 0313 12/02/18 0615  NA 138 137  K 4.3 4.0  CL 107 105  CO2 21* 23  GLUCOSE 146* 180*  BUN 22 20  CREATININE 1.81* 1.81*  CALCIUM 8.5* 8.5*   PT/INR No results for input(s): LABPROT, INR in the last 72 hours. CMP     Component Value Date/Time   NA 137 12/02/2018 0615   K 4.0 12/02/2018 0615   CL 105 12/02/2018 0615   CO2 23 12/02/2018 0615   GLUCOSE 180 (H) 12/02/2018 0615   BUN 20 12/02/2018 0615   CREATININE 1.81 (H) 12/02/2018 0615   CALCIUM 8.5 (L) 12/02/2018 0615   PROT 6.0 (L) 11/14/2018 1208   ALBUMIN 3.5 11/14/2018 1208   AST 58 (H) 11/14/2018 1208   ALT 43 11/14/2018 1208   ALKPHOS 55 11/14/2018 1208   BILITOT 0.7 11/14/2018 1208   GFRNONAA 36 (L) 12/02/2018 0615   GFRAA 41 (L) 12/02/2018 0615   Lipase  No results found for: LIPASE     Studies/Results: No results found.  Anti-infectives: Anti-infectives (From  admission, onward)   Start     Dose/Rate Route Frequency Ordered Stop   11/27/18 2000  ceFAZolin (ANCEF) IVPB 2g/100 mL premix     2 g 200 mL/hr over 30 Minutes Intravenous Every 8 hours 11/27/18 1732 11/27/18 2030   11/27/18 0600  ceFAZolin (ANCEF) 3 g in dextrose 5 % 50 mL IVPB     3 g 100 mL/hr over 30 Minutes Intravenous On call to O.R. 11/26/18 0806 11/27/18 1228       Assessment/Plan Gastric Cancer S/p Diagnostic laparoscopy, distal gastrectomy with retrocolic Billroth II anastomosis, Dr. Barry Dienes, 7/14, POD 5 - Path report: GASTRIC ADENOCARCINOMA, 1.4 CM. TUMOR EXTENDS INTO SUBMUCOSA. MARGINS NOT INVOLVED. METASTATIC CARCINOMA IN ONE OF TEN LYMPH NODES.  CKD/-hyperkalemia-Creatinine stable at 1.81. K normal today.  A fib /HTN- appreciate cardiology input-on heparin gtt. Hgb stable. Can transition back to Xarelto. Pulmonary fibrosis/Sleep apnea. - pulm consult appreciated. Try to minimize narcotics, pulmonary toilet. Pain control- PCA d/c'd. PRN meds.   FEN - Soft. VTE - SCD, Heparin (okay to transition to Xarelto) ID - Ancef peri-op   LOS: 5 days    Jillyn Ledger , [facility] Surgery 12/02/2018, 8:45 AM Pager: [phone]

## 2018-12-02 NOTE — Progress Notes (Signed)
ANTICOAGULATION CONSULT NOTE  Pharmacy Consult for Heparin Indication: atrial fibrillation  No Known Allergies  Patient Measurements: Height: 5' 9.5" (176.5 cm) Weight: (!) 305 lb 1.9 oz (138.4 kg) IBW/kg (Calculated) : 71.85 Heparin Dosing Weight: 104.6 kg  Vital Signs: Temp: 98.2 F (36.8 C) (07/19 0817) Temp Source: Oral (07/19 0817) BP: 144/84 (07/19 0817) Pulse Rate: 83 (07/19 0817)  Labs: Recent Labs    11/29/18 1604  11/29/18 2352  11/30/18 1703 12/01/18 0313 12/02/18 0615  HGB  --    < > 12.0*  --   --  12.0* 12.0*  HCT  --   --  37.2*  --   --  37.0* 37.5*  PLT  --   --  153  --   --  165 182  APTT 39*  --   --   --   --   --   --   HEPARINUNFRC  --   --  0.25*   < > 0.27* 0.46 0.49  CREATININE  --   --  2.09*  --   --  1.81* 1.81*   < > = values in this interval not displayed.    Estimated Creatinine Clearance: 48.4 mL/min (A) (by C-G formula based on SCr of 1.81 mg/dL (H)).   Assessment: 78 year old man with Afib (on Xarelto PTA, last dose on 11/22/18, per pt's wife).  S/P diagnostic laparoscopy, distal gastrectomy with retrocolic Billroth II anastomosis for adenocarcinoma of gastric antrum on 11/27/18.  Pharmacy consulted to dose IV heparin while Xarelto is on hold.  Heparin level is therapeutic and stable; no bleeding documented.  Goal of Therapy:  Heparin level 0.3-0.7 units/ml Monitor platelets by anticoagulation protocol: Yes   Plan:  Continue heparin gtt at 2000 units/hr Daily heparin level and CBC Monitor for s/sx of bleeding F/U resume Xarelto and home meds  Gabriel Paulding D. Mina Marble, PharmD, BCPS, [place] 12/02/2018, 12:19 PM

## 2018-12-02 NOTE — Progress Notes (Signed)
Physical Therapy Treatment Patient Details Name: Jesse Wheeler MRN: [id number] DOB: [birth year] Today's Date: 12/02/2018    History of Present Illness Pt is a 78 y.o. 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     PT Comments    Pt making good progress towards physical therapy goals. Ambulating 250 feet with no assistive device and supervision, HR peak 132 bpm, SpO2 100% on RA, DOE 1/4. Less wheezing noted with activity today. Pt feeling like he needs to have a BM but unable. Continues with decreased short term memory and awareness, unsure if this is baseline? Pt wife available to provide increased assist.     Follow Up Recommendations  No PT follow up;Supervision/Assistance - 24 hour     Equipment Recommendations  None recommended by PT    Recommendations for Other Services       Precautions / Restrictions Precautions Precautions: Fall Restrictions Weight Bearing Restrictions: No    Mobility  Bed Mobility Overal bed mobility: Modified Independent                Transfers Overall transfer level: Modified independent               General transfer comment: Standing multiple times from toilet without assist  Ambulation/Gait Ambulation/Gait assistance: Supervision Gait Distance (Feet): 250 Feet Assistive device: None Gait Pattern/deviations: Step-through pattern;Trunk flexed Gait velocity: decreased   General Gait Details: Trunk slightly flexed, supervision for safety. DOE 1/4   Stairs             Wheelchair Mobility    Modified Rankin (Stroke Patients Only)       Balance Overall balance assessment: Needs assistance Sitting-balance support: Feet supported Sitting balance-Leahy Scale: Good     Standing balance support: No upper extremity supported;During functional activity Standing  balance-Leahy Scale: Fair                              Cognition Arousal/Alertness: Awake/alert Behavior During Therapy: WFL for tasks assessed/performed Overall Cognitive Status: Impaired/Different from baseline Area of Impairment: Safety/judgement;Memory                     Memory: Decreased short-term memory   Safety/Judgement: Decreased awareness of safety;Decreased awareness of deficits            Exercises      General Comments        Pertinent Vitals/Pain Pain Assessment: Faces Faces Pain Scale: No hurt    Home Living                      Prior Function            PT Goals (current goals can now be found in the care plan section) Acute Rehab PT Goals Patient Stated Goal: "work in my garage." PT Goal Formulation: With patient Time For Goal Achievement: 12/12/18 Potential to Achieve Goals: Good Progress towards PT goals: Progressing toward goals    Frequency    Min 3X/week      PT Plan Current plan remains appropriate    Co-evaluation              AM-PAC PT "6 Clicks" Mobility   Outcome Measure  Help needed turning from your back to your side while in a flat bed without using bedrails?:  None Help needed moving from lying on your back to sitting on the side of a flat bed without using bedrails?: A Little Help needed moving to and from a bed to a chair (including a wheelchair)?: None Help needed standing up from a chair using your arms (e.g., wheelchair or bedside chair)?: None Help needed to walk in hospital room?: A Little Help needed climbing 3-5 steps with a railing? : A Little 6 Click Score: 21    End of Session Equipment Utilized During Treatment: Gait belt Activity Tolerance: Patient tolerated treatment well Patient left: with call bell/phone within reach;in bed;with bed alarm set;with family/visitor present Nurse Communication: Mobility status PT Visit Diagnosis: Pain;Difficulty in walking, not  elsewhere classified (R26.2) Pain - part of body: (abdomen)     Time: 6269-4854 PT Time Calculation (min) (ACUTE ONLY): 20 min  Charges:  $Therapeutic Activity: 8-22 mins                     Ellamae Sia, PT, DPT Acute Rehabilitation Services Pager [phone] Office [phone]    Willy Eddy 12/02/2018, 4:28 PM

## 2018-12-03 LAB — GLUCOSE, CAPILLARY
Glucose-Capillary: 144 mg/dL — ABNORMAL HIGH (ref 70–99)
Glucose-Capillary: 145 mg/dL — ABNORMAL HIGH (ref 70–99)
Glucose-Capillary: 146 mg/dL — ABNORMAL HIGH (ref 70–99)
Glucose-Capillary: 154 mg/dL — ABNORMAL HIGH (ref 70–99)
Glucose-Capillary: 160 mg/dL — ABNORMAL HIGH (ref 70–99)
Glucose-Capillary: 163 mg/dL — ABNORMAL HIGH (ref 70–99)

## 2018-12-03 LAB — HEPARIN LEVEL (UNFRACTIONATED): Heparin Unfractionated: 0.6 IU/mL (ref 0.30–0.70)

## 2018-12-03 MED ORDER — OXYCODONE HCL 5 MG PO TABS: 2.5000 mg | ORAL_TABLET | Freq: Four times a day (QID) | ORAL | 0 refills | Status: AC | PRN

## 2018-12-03 MED ORDER — ONDANSETRON 4 MG PO TBDP: 4.0000 mg | ORAL_TABLET | Freq: Four times a day (QID) | ORAL | 0 refills | Status: AC | PRN

## 2018-12-03 NOTE — Progress Notes (Signed)
ANTICOAGULATION CONSULT NOTE  Pharmacy Consult for Heparin Indication: atrial fibrillation  No Known Allergies  Patient Measurements: Height: 5' 9.5" (176.5 cm) Weight: (!) 305 lb 1.9 oz (138.4 kg) IBW/kg (Calculated) : 71.85 Heparin Dosing Weight: 104.6 kg  Vital Signs: Temp: 97.5 F (36.4 C) (07/20 0751) Temp Source: Oral (07/20 0751) BP: 155/75 (07/20 0519) Pulse Rate: 80 (07/20 0812)  Labs: Recent Labs    12/01/18 0313 12/02/18 0615 12/03/18 0605  HGB 12.0* 12.0*  --   HCT 37.0* 37.5*  --   PLT 165 182  --   HEPARINUNFRC 0.46 0.49 0.60  CREATININE 1.81* 1.81*  --     Estimated Creatinine Clearance: 48.4 mL/min (A) (by C-G formula based on SCr of 1.81 mg/dL (H)).   Assessment: 78 year old man with Afib (on Xarelto PTA, last dose on 11/22/18, per pt's wife).  S/P diagnostic laparoscopy, distal gastrectomy with retrocolic Billroth II anastomosis for adenocarcinoma of gastric antrum on 11/27/18.  Pharmacy consulted to dose IV heparin while Xarelto is on hold - may discharge today on Xarelto per Dr. Marlowe Aschoff note but had episode of N/V this AM.  Heparin level remains therapeutic at 0.6; no bleeding issues documented.  Goal of Therapy:  Heparin level 0.3-0.7 units/ml Monitor platelets by anticoagulation protocol: Yes   Plan:  Continue heparin gtt at 2000 units/hr Daily heparin level and CBC Monitor for s/sx of bleeding F/U resume Xarelto (Dr. Barry Dienes waiting to see if tolerates lunch prior to transition)  Elicia Lamp, PharmD, BCPS Please check AMION for all [facility] contact numbers Clinical Pharmacist 12/03/2018 10:09 AM

## 2018-12-03 NOTE — Progress Notes (Signed)
Patient has eaten lunch and denies any N/V.  Dr. Barry Dienes advised, will proceed with D/C.

## 2018-12-03 NOTE — Progress Notes (Signed)
IV removed, discharge instructions reviewed with patient.  Will discharge patient home in the care of his wife.

## 2018-12-03 NOTE — Discharge Instructions (Signed)
[facility]      [facility] °[phone] ° °ABDOMINAL SURGERY: POST OP INSTRUCTIONS ° °Always review your discharge instruction sheet given to you by the facility where your surgery was performed. ° °IF YOU HAVE DISABILITY OR FAMILY LEAVE FORMS, YOU MUST BRING THEM TO THE OFFICE FOR PROCESSING.  PLEASE DO NOT GIVE THEM TO YOUR DOCTOR. ° °1. A prescription for pain medication may be given to you upon discharge.  Take your pain medication as prescribed, if needed.  If narcotic pain medicine is not needed, then you may take acetaminophen (Tylenol) or ibuprofen (Advil) as needed. °2. Take your usually prescribed medications unless otherwise directed. °3. If you need a refill on your pain medication, please contact your pharmacy. They will contact our office to request authorization.  Prescriptions will not be filled after 5pm or on week-ends. °4. You should follow a light diet the first few days after arrival home, such as soup and crackers, pudding, etc.unless your doctor has advised otherwise. A high-fiber, low fat diet can be resumed as tolerated.   Be sure to include lots of fluids daily. Most patients will experience some swelling and bruising on the chest and neck area.  Ice packs will help.  Swelling and bruising can take several days to resolve °5. Most patients will experience some swelling and bruising in the area of the incision. Ice pack will help. Swelling and bruising can take several days to resolve..  °6. It is common to experience some constipation if taking pain medication after surgery.  Increasing fluid intake and taking a stool softener will usually help or prevent this problem from occurring.  A mild laxative (Milk of Magnesia or Miralax) should be taken according to package directions if there are no bowel movements after 48 hours. °7.  You may have steri-strips (small skin tapes) in place directly over the incision.  These strips should be left on the skin for 10-14 days.  If your  surgeon used skin glue on the incision, you may shower in 48 hours.  The glue will flake off over the next 2-3 weeks.  Any sutures or staples will be removed at the office during your follow-up visit. You may find that a light gauze bandage over your incision may keep your staples from being rubbed or pulled. You may shower and replace the bandage daily. °8. ACTIVITIES:  You may resume regular (light) daily activities beginning the next day--such as daily self-care, walking, climbing stairs--gradually increasing activities as tolerated.  You may have sexual intercourse when it is comfortable.  Refrain from any heavy lifting or straining until approved by your doctor. °a. You may drive when you no longer are taking prescription pain medication, you can comfortably wear a seatbelt, and you can safely maneuver your car and apply brakes °b. Return to Work: __________12 weeks if applicable_________________________ °9. You should see your doctor in the office for a follow-up appointment approximately two weeks after your surgery.  Make sure that you call for this appointment within a day or two after you arrive home to insure a convenient appointment time. °OTHER INSTRUCTIONS:  °_____________________________________________________________ °_____________________________________________________________ ° °WHEN TO CALL YOUR DOCTOR: °1. Fever over 101.0 °2. Inability to urinate °3. Nausea and/or vomiting °4. Extreme swelling or bruising °5. Continued bleeding from incision. °6. Increased pain, redness, or drainage from the incision. °7. Difficulty swallowing or breathing °8. Muscle cramping or spasms. °9. Numbness or tingling in hands or feet or around lips. ° °The clinic staff is   available to answer your questions during regular business hours.  Please don’t hesitate to call and ask to speak to one of the nurses if you have concerns. ° °For further questions, please visit [email or web address] ° ° ° °

## 2018-12-03 NOTE — Discharge Summary (Signed)
Physician Discharge Summary  Patient ID: Jesse Wheeler MRN: [id number] DOB/AGE: [id number] y.o.  Admit date: 11/27/2018 Discharge date: 12/03/2018  Admission Diagnoses: Patient Active Problem List   Diagnosis Date Noted  . 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 Primary adenocarcinoma of stomach ([name])   . Thrombocytopenia ([name]) 09/02/2017  . Sepsis secondary to UTI ([name]) 09/01/2017  . AKI (acute kidney injury) ([name]) 09/01/2017  . CKD (chronic kidney disease), stage III ([name]) 09/01/2017  . Benign essential HTN 09/01/2017  . Chronic anticoagulation with Xarelto 12/29/2016  . Iron deficiency anemia due to chronic blood loss 12/29/2016  . PAF (paroxysmal atrial fibrillation) ([place]) 09/22/2016  . TIA (transient ischemic attack) 12/14/2014  . CVA (cerebral infarction) 12/14/2014  . DM type 2, uncontrolled, with renal complications ([name]) 78/93/8101  . History of colonic polyps 04/14/2014  . Cerumen impaction 02/10/2014  . 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 Diabetes mellitus, type 2 ([name]) 08/09/2013  . Routine general medical examination at a health care facility 07/18/2013  . Benign prostatic hyperplasia with urinary obstruction 02/06/2013  . Amnesia 02/06/2013  . Insomnia, persistent 02/06/2013  . Back ache 02/01/2012  . CAD (coronary artery disease) 12/27/2010  . Obese 12/27/2010  . Anemia, deficiency 08/10/2010  . ONYCHOMYCOSIS 09/07/2006  . BOWEN'S DISEASE 09/07/2006  . DIABETES MELLITUS, TYPE II 09/07/2006  . Dyslipidemia 09/07/2006  . PANIC DISORDER 09/07/2006  . SEXUAL DYSFUNCTION 09/07/2006  . TINNITUS 09/07/2006  . Essential hypertension 09/07/2006  . 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 VERTIGO 09/07/2006    Discharge Diagnoses:  Active Problems:   Primary adenocarcinoma of pyloric antrum ([name]) acute kidney injury on chronic kidney disease Post op delirium  and  same as above.     Discharged Condition: stable  Hospital Course:  Pt is a 78 yo M who underwent diagnostic laparoscopy and distal gastrectomy 11/27/2018 for gastric cancer.  He was sent to progressive care due to his pulmonary and cardiac issues.  Based on recommendations by his outpatient pulmonologist and cardiologist at the [facility], cards and PCCM was consulted here.  He was unable to use his cpap the first night due to the NGT.  Additionally, his potassium jumped to 6.8 and creatinine rose as well.  He did not respond to insulin and D50.  Nephrology was consulted.  He was treated with IV fluids and lasix.   The second afternoon/evening he developed some delirium and pulled out his NGT and OnQ pain pump.  He was started on ice chips on POD 3.  His wife was allowed to visit due to age and delirium.  His mental status would be relatively sharp during the day but he would sundown.  He got a few doses of haldol (very low dose).  His diet was slowly advanced.  His pain control was an issue at first, but by the time of discharge he was taking very little.  He was ambulatory.  He was able to void once the foley was removed.    On the day of discharge He had one episode of nausea and vomiting with breakfast, but this settled down.  He was able to take in solid food well for lunch.  Pt and spouse desired discharge.    Consults: cardiology, nephrology and pulmonology  Significant Diagnostic Studies: labs: prior to d/c cr 1.8 (baseline), K 4.0, HCT 37.5, WBCs 8.2k  Treatments: surgery: see above and heparin IV, IV fluids, CPAP  Discharge Exam:  Blood pressure (!) 155/75, pulse 85, temperature (!) 97.5 F (36.4 C), temperature source Oral, resp. rate 18, height 5' 9.5" (1.765 m), weight (!) 138.4 kg, SpO2 95 %. General appearance: alert, cooperative and no distress Resp: breathing comfortably GI: soft, non distended, non tender, some irritation from staples. Extremities: +1 edema  Disposition: Discharge  disposition: 01-Home or Self Care       Discharge Instructions    Call MD for:  difficulty breathing, headache or visual disturbances   Complete by: As directed    Call MD for:  persistant nausea and vomiting   Complete by: As directed    Call MD for:  redness, tenderness, or signs of infection (pain, swelling, redness, odor or green/yellow discharge around incision site)   Complete by: As directed    Call MD for:  severe uncontrolled pain   Complete by: As directed    Call MD for:  temperature >100.4   Complete by: As directed    Diet - low sodium heart healthy   Complete by: As directed    Increase activity slowly   Complete by: As directed      Allergies as of 12/03/2018   No Known Allergies     Medication List    TAKE these medications   aspirin EC 81 MG tablet Take 81 mg by mouth.   doxazosin 8 MG tablet Commonly known as: CARDURA Take 8 mg by mouth daily.   ERGOCALCIFEROL PO Take 8,000 Units by mouth every 30 (thirty) days.   ezetimibe 10 MG tablet Commonly known as: ZETIA Take 10 mg by mouth daily.   ferrous sulfate 325 (65 FE) MG tablet Take 325 mg by mouth daily with breakfast.   Fifty50 Glucose Meter 2.0 w/Device Kit by Does not apply route.   furosemide 20 MG tablet Commonly known as: LASIX Take 20 mg by mouth daily.   gabapentin 300 MG capsule Commonly known as: NEURONTIN Take 900 mg by mouth 3 (three) times daily.   insulin aspart 100 UNIT/ML injection Commonly known as: novoLOG Inject 8-40 Units into the skin 3 (three) times daily with meals.   insulin glargine 100 UNIT/ML injection Commonly known as: LANTUS Inject 60-65 Units into the skin See admin instructions. 65 units in the morning, 60 units at bedtime   isosorbide mononitrate 30 MG 24 hr tablet Commonly known as: Imdur TAKE 2 TABLETS PO DAILY What changed:   how much to take  how to take this  when to take this  additional instructions   lisinopril 40 MG  tablet Commonly known as: ZESTRIL Take 20 mg by mouth daily.   lisinopril 20 MG tablet Commonly known as: ZESTRIL Take 1 tablet (20 mg total) by mouth daily.   metoprolol succinate 25 MG 24 hr tablet Commonly known as: TOPROL-XL Take 12.5 mg by mouth daily.   nitroGLYCERIN 0.4 MG SL tablet Commonly known as: NITROSTAT Place 1 tablet (0.4 mg total) under the tongue every 5 (five) minutes as needed.   omeprazole 40 MG capsule Commonly known as: PRILOSEC Take 1 capsule (40 mg total) by mouth daily.   ondansetron 4 MG disintegrating tablet Commonly known as: ZOFRAN-ODT Take 1 tablet (4 mg total) by mouth every 6 (six) hours as needed for nausea.   OneTouch Verio test strip Generic drug: glucose blood   oxyCODONE 5 MG immediate release tablet Commonly known as: Oxy IR/ROXICODONE Take 0.5-1 tablets (2.5-5 mg total) by mouth every 6 (six) hours as needed for moderate pain, severe  pain or breakthrough pain.   Ozempic (1 MG/DOSE) 2 MG/1.5ML Sopn Generic drug: Semaglutide (1 MG/DOSE) Inject 1 mg into the skin every Friday.   Rivaroxaban 15 MG Tabs tablet Commonly known as: XARELTO Take 1 tablet by mouth once daily with a meal What changed:   how much to take  how to take this  when to take this   senna-docusate 8.6-50 MG tablet Commonly known as: Senokot-S Take 1 tablet by mouth daily.   simvastatin 40 MG tablet Commonly known as: ZOCOR Take 40 mg by mouth daily.   temazepam 15 MG capsule Commonly known as: RESTORIL Take 15 mg by mouth at bedtime as needed for sleep.      Follow-up Information    [name], MD Follow up in 2 week(s).   Specialty: General Surgery Contact information: [address] [place] [address] [phone]           Signed: [name] 12/03/2018, 8:08 AM

## 2018-12-03 NOTE — Progress Notes (Signed)
    6 Days Post-Op  Subjective: CC: Pt denied n/v, having flatus and BMs.    Objective: Vital signs in last 24 hours: Temp:  [97.5 F (36.4 C)-98.6 F (37 C)] 97.5 F (36.4 C) (07/20 0751) Pulse Rate:  [75-93] 80 (07/20 0812) Resp:  [13-19] 18 (07/20 0812) BP: (112-155)/(74-85) 155/75 (07/20 0519) SpO2:  [94 %-100 %] 99 % (07/20 0812) Last BM Date: 12/03/18  Intake/Output from previous day: 07/19 0701 - 07/20 0700 In: 966.1 [P.O.:480; I.V.:486.1] Out: 900 [Urine:900] Intake/Output this shift: No intake/output data recorded.  PE: Gen:  Alert, NAD, pleasant Pulm:  Normal rate and effort  Abd: Soft, non tender, non distended.  Some irritation at staple sites.   Psych: A&Ox3  Skin: no rashes noted, warm and dry  Lab Results:  Recent Labs    12/01/18 0313 12/02/18 0615  WBC 8.7 8.2  HGB 12.0* 12.0*  HCT 37.0* 37.5*  PLT 165 182   BMET Recent Labs    12/01/18 0313 12/02/18 0615  NA 138 137  K 4.3 4.0  CL 107 105  CO2 21* 23  GLUCOSE 146* 180*  BUN 22 20  CREATININE 1.81* 1.81*  CALCIUM 8.5* 8.5*   PT/INR No results for input(s): LABPROT, INR in the last 72 hours. CMP     Component Value Date/Time   NA 137 12/02/2018 0615   K 4.0 12/02/2018 0615   CL 105 12/02/2018 0615   CO2 23 12/02/2018 0615   GLUCOSE 180 (H) 12/02/2018 0615   BUN 20 12/02/2018 0615   CREATININE 1.81 (H) 12/02/2018 0615   CALCIUM 8.5 (L) 12/02/2018 0615   PROT 6.0 (L) 11/14/2018 1208   ALBUMIN 3.5 11/14/2018 1208   AST 58 (H) 11/14/2018 1208   ALT 43 11/14/2018 1208   ALKPHOS 55 11/14/2018 1208   BILITOT 0.7 11/14/2018 1208   GFRNONAA 36 (L) 12/02/2018 0615   GFRAA 41 (L) 12/02/2018 0615   Lipase  No results found for: LIPASE     Studies/Results: No results found.  Anti-infectives: Anti-infectives (From admission, onward)   Start     Dose/Rate Route Frequency Ordered Stop   11/27/18 2000  ceFAZolin (ANCEF) IVPB 2g/100 mL premix     2 g 200 mL/hr over 30 Minutes  Intravenous Every 8 hours 11/27/18 1732 11/27/18 2030   11/27/18 0600  ceFAZolin (ANCEF) 3 g in dextrose 5 % 50 mL IVPB     3 g 100 mL/hr over 30 Minutes Intravenous On call to O.R. 11/26/18 0806 11/27/18 1228       Assessment/Plan Gastric Cancer S/p Diagnostic laparoscopy, distal gastrectomy with retrocolic Billroth II anastomosis, Dr. Barry Dienes, 7/14, POD 6 - Path report: GASTRIC ADENOCARCINOMA, 1.4 CM. TUMOR EXTENDS INTO SUBMUCOSA. MARGINS NOT INVOLVED. METASTATIC CARCINOMA IN ONE OF TEN LYMPH NODES.  CKD/-hyperkalemia-Creatinine stable at 1.81. K normal.  A fib /HTN- appreciate cardiology input-on heparin gtt. Hgb stable. Can transition back to Xarelto. Pulmonary fibrosis/Sleep apnea. - pulm consult appreciated. Try to minimize narcotics, pulmonary toilet. Pain control-has PRN meds written, hasn't taken tramadol or oxycodone.    FEN - Soft. VTE - SCD, Heparin (okay to transition to Xarelto) ID - Ancef peri-op  Planning d/c today, but in the hour since I saw him to when this note written, pt had episode of emesis.  Will see how lunch goes before making decision about discharge.     LOS: 6 days    [name] , [facility] 12/03/2018, 8:21 AM

## 2018-12-12 ENCOUNTER — Telehealth: Payer: Self-pay | Admitting: *Deleted

## 2018-12-12 NOTE — Telephone Encounter (Signed)
Called wife regarding appointment tomorrow and she reports he died at home this weekend. Unsure if diabetic coma or heart attack. Offered our condolences.

## 2018-12-13 ENCOUNTER — Inpatient Hospital Stay: Payer: Medicare Other

## 2018-12-13 ENCOUNTER — Inpatient Hospital Stay: Payer: Medicare Other | Admitting: Oncology

## 2018-12-15 DEATH — deceased

## 2019-03-16 ENCOUNTER — Encounter: Payer: Self-pay | Admitting: Internal Medicine

## 2019-05-13 ENCOUNTER — Encounter: Payer: Self-pay | Admitting: Internal Medicine

## 2021-06-11 IMAGING — DX PORTABLE ABDOMEN - 1 VIEW
1 series · 1 of 1 positions shown · non-contrast
Comparison: Chest radiograph same day

CLINICAL DATA: Encounter for nasogastric (NG) tube placement

EXAM:
PORTABLE ABDOMEN - 1 VIEW

[abdomen kub]
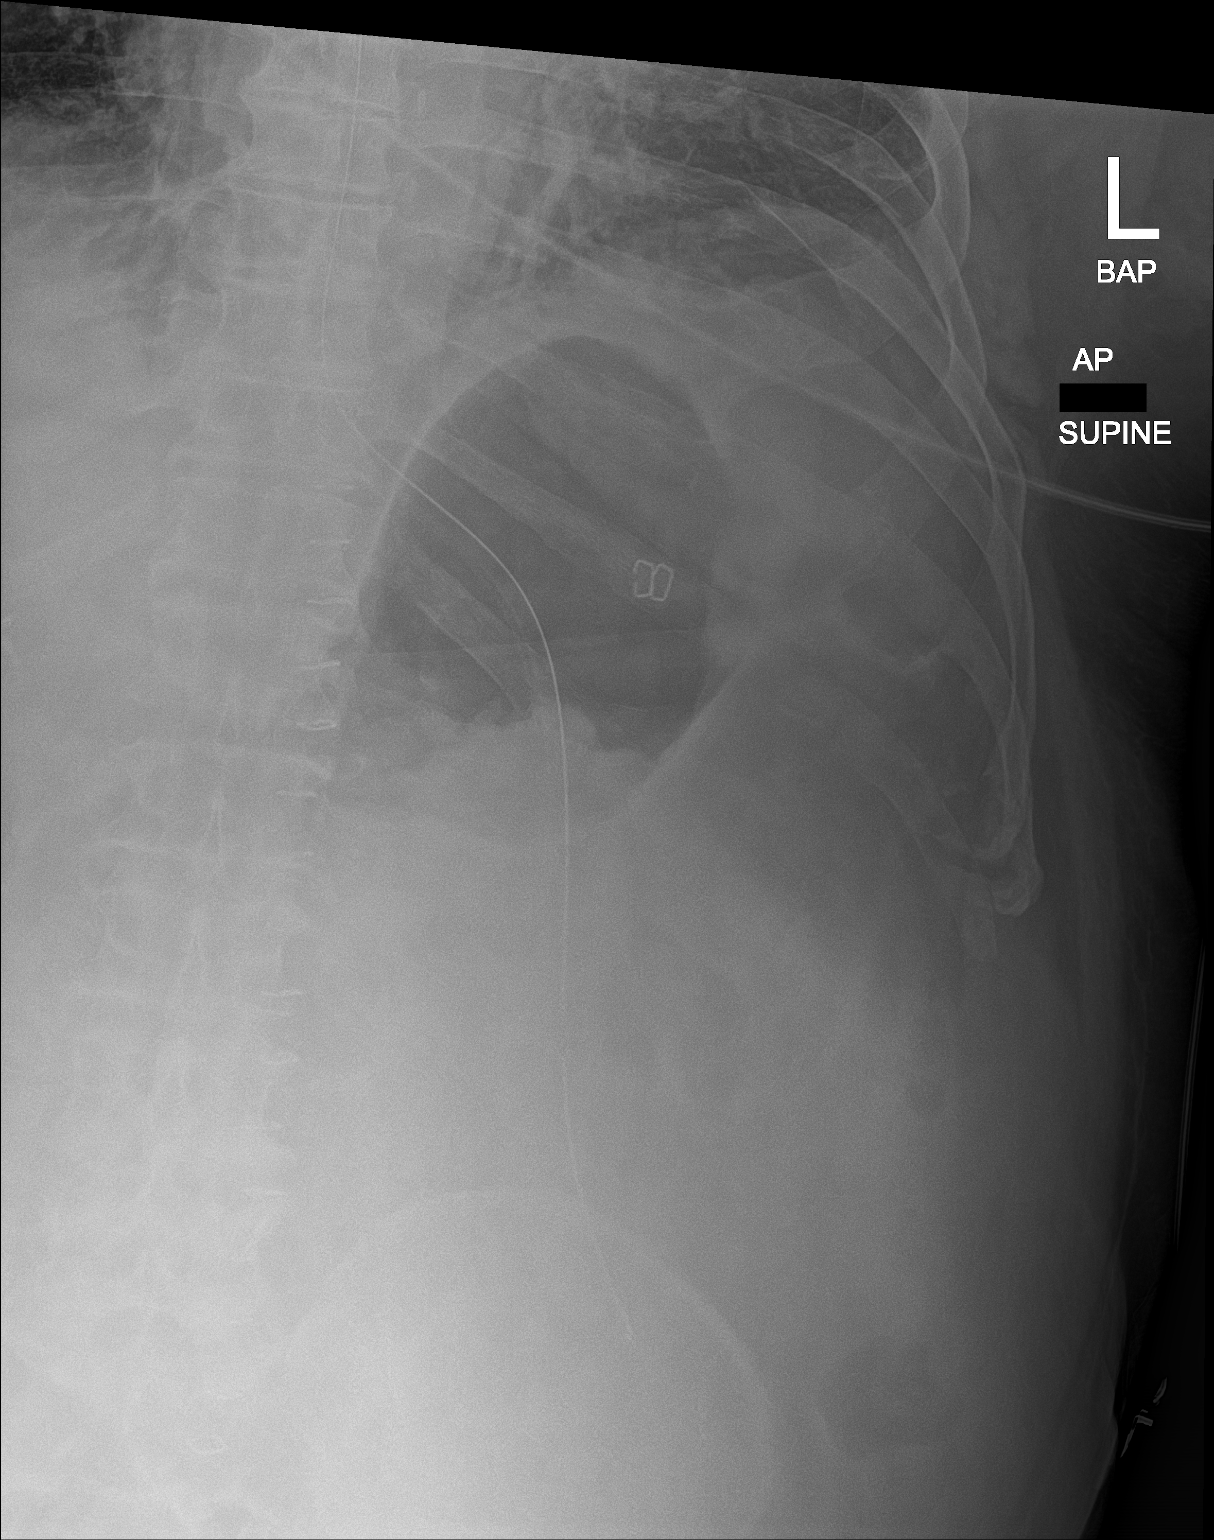

[1 of 1 positions shown; findings below may reference images not displayed]

FINDINGS: NG tube extends through the GE junction and stomach into the LEFT
lower quadrant. Tube presumably extends through a gastrojejunostomy.
Tube tip terminates in the LEFT lower quadrant over the iliac crest.
Midline surgical staples noted.
IMPRESSION: 1. NG tube extends into stomach and presumably through a
gastrojejunostomy into the LEFT lower quadrant.
2. Midline surgical staples noted.

## 2021-06-11 IMAGING — DX PORTABLE CHEST - 1 VIEW
1 series · 1 of 1 positions shown · non-contrast
Comparison: 09/01/2017.  Chest CT dated 09/21/2018.

CLINICAL DATA: Hypoxia.

EXAM:
PORTABLE CHEST 1 VIEW

[chest ap]
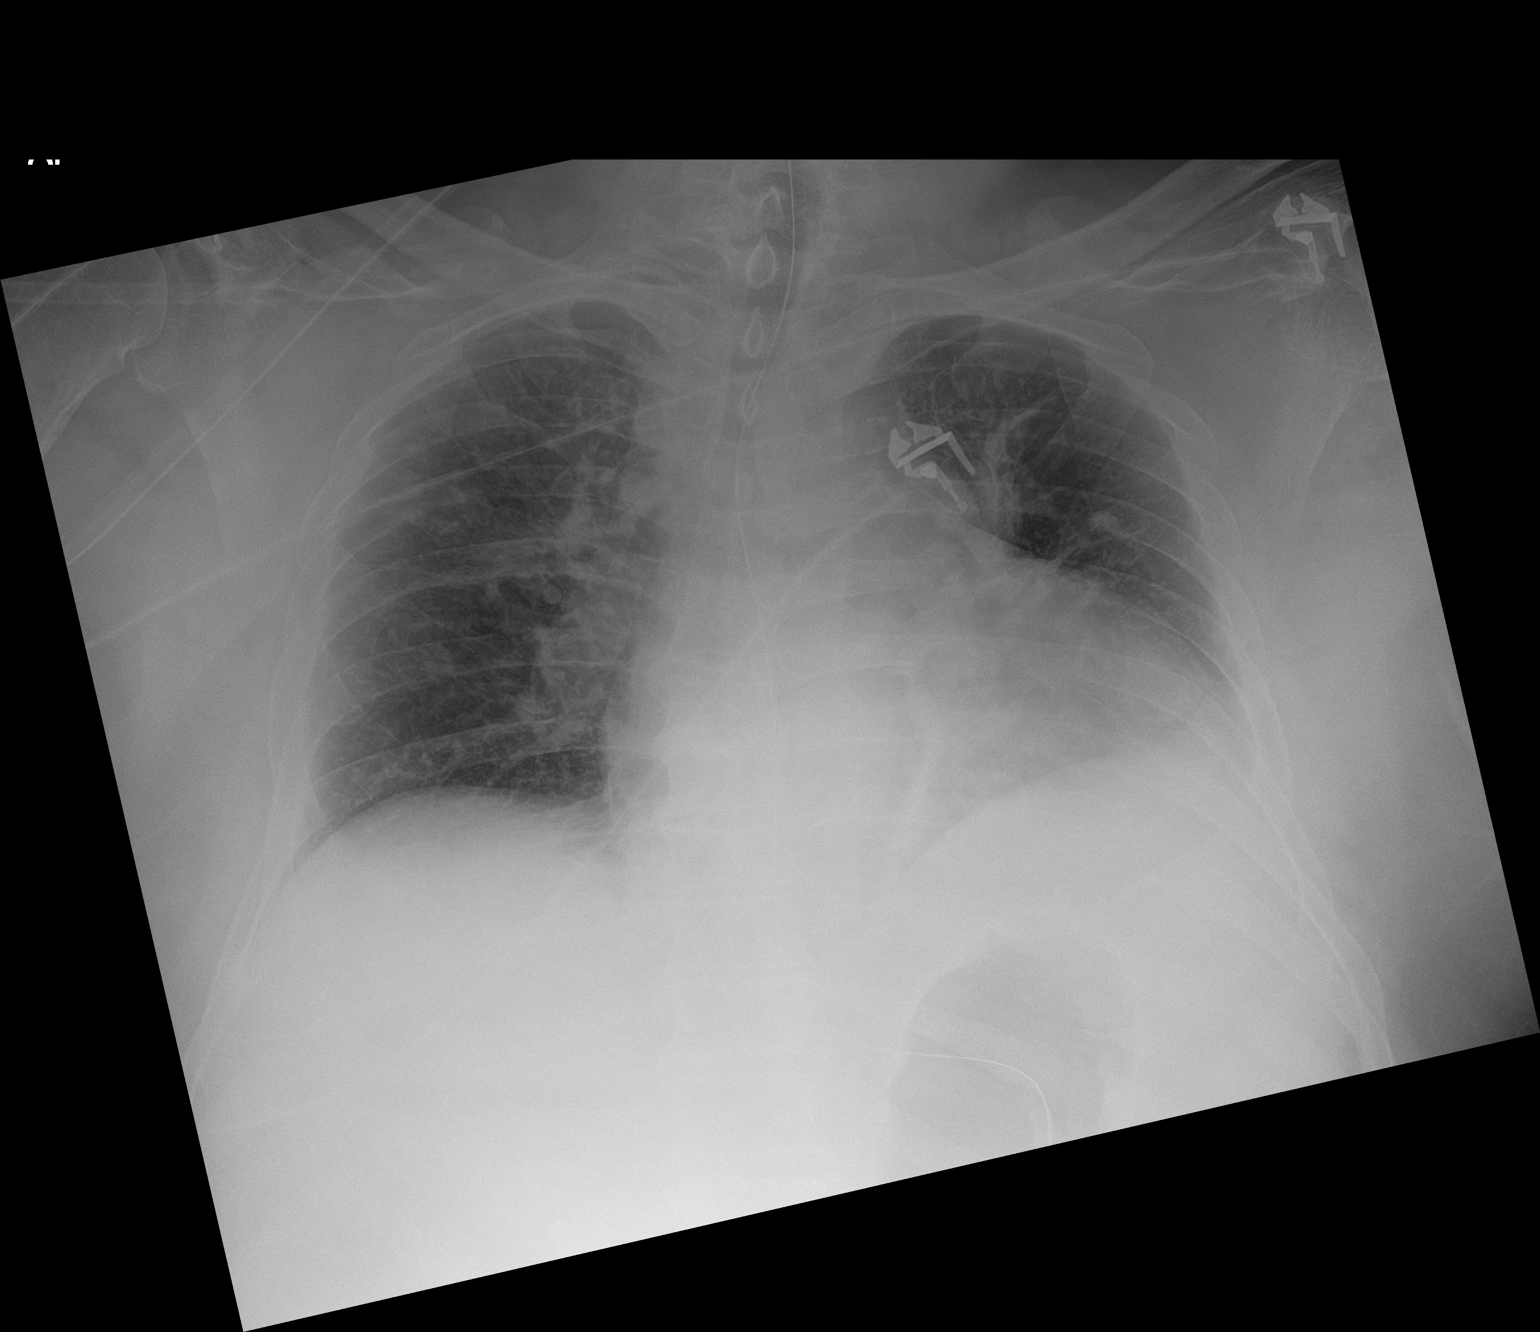

[1 of 1 positions shown; findings below may reference images not displayed]

FINDINGS: Stable enlarged cardiac silhouette. Interval linear density in the
medial aspects of the left upper and lower lung zones and small
nodular density in the left mid lung zone. Interval minimal patchy
density at the left lateral lung base. Clear right lung.
Unremarkable bones. Nasogastric tube extending into the stomach.
IMPRESSION: 1. Small nodular density in the left mid lung zone. This may
represent a high positioned left nipple shadow based on the previous
CT no lung nodule seen at that location at that time. This could be
confirmed with a repeat frontal view with nipple markers.
2. Minimal patchy atelectasis or pneumonia at the left lateral lung
base.
3. Interval subsegmental atelectasis in the left upper and lower
lung zones medially.
4. Stable cardiomegaly.
